# Patient Record
Sex: Female | Born: 1955 | Hispanic: No | Marital: Married | Smoking: Former smoker
Health system: Western US, Academic
[De-identification: ages and names within clinical notes are randomized; demographics above are authoritative.]

## PROBLEM LIST (undated history)

## (undated) DIAGNOSIS — M25541 Pain in joints of right hand: Secondary | ICD-10-CM

## (undated) DIAGNOSIS — M5430 Sciatica, unspecified side: Secondary | ICD-10-CM

## (undated) DIAGNOSIS — K648 Other hemorrhoids: Secondary | ICD-10-CM

## (undated) DIAGNOSIS — D369 Benign neoplasm, unspecified site: Secondary | ICD-10-CM

## (undated) DIAGNOSIS — I10 Essential (primary) hypertension: Secondary | ICD-10-CM

## (undated) DIAGNOSIS — H6982 Other specified disorders of Eustachian tube, left ear: Principal | ICD-10-CM

## (undated) DIAGNOSIS — F419 Anxiety disorder, unspecified: Secondary | ICD-10-CM

## (undated) DIAGNOSIS — R748 Abnormal levels of other serum enzymes: Secondary | ICD-10-CM

## (undated) DIAGNOSIS — D696 Thrombocytopenia, unspecified: Secondary | ICD-10-CM

## (undated) DIAGNOSIS — H1013 Acute atopic conjunctivitis, bilateral: Secondary | ICD-10-CM

## (undated) DIAGNOSIS — R7301 Impaired fasting glucose: Secondary | ICD-10-CM

## (undated) DIAGNOSIS — K760 Fatty (change of) liver, not elsewhere classified: Secondary | ICD-10-CM

## (undated) DIAGNOSIS — E785 Hyperlipidemia, unspecified: Secondary | ICD-10-CM

## (undated) DIAGNOSIS — Z79890 Hormone replacement therapy (postmenopausal): Secondary | ICD-10-CM

## (undated) DIAGNOSIS — J309 Allergic rhinitis, unspecified: Secondary | ICD-10-CM

## (undated) DIAGNOSIS — I4949 Other premature depolarization: Secondary | ICD-10-CM

## (undated) DIAGNOSIS — R7303 Prediabetes: Principal | ICD-10-CM

## (undated) DIAGNOSIS — N200 Calculus of kidney: Secondary | ICD-10-CM

## (undated) DIAGNOSIS — C20 Malignant neoplasm of rectum: Secondary | ICD-10-CM

## (undated) DIAGNOSIS — N920 Excessive and frequent menstruation with regular cycle: Secondary | ICD-10-CM

## (undated) DIAGNOSIS — G43909 Migraine, unspecified, not intractable, without status migrainosus: Secondary | ICD-10-CM

## (undated) HISTORY — DX: Anxiety disorder, unspecified: F41.9

## (undated) HISTORY — DX: Prediabetes: R73.03

## (undated) HISTORY — DX: Malignant neoplasm of rectum: C20

## (undated) HISTORY — DX: Other premature depolarization: I49.49

## (undated) HISTORY — DX: Abnormal levels of other serum enzymes: R74.8

## (undated) HISTORY — DX: Thrombocytopenia, unspecified: D69.6

## (undated) HISTORY — DX: Fatty (change of) liver, not elsewhere classified: K76.0

## (undated) HISTORY — DX: Hormone replacement therapy: Z79.890

## (undated) HISTORY — DX: Other hemorrhoids: K64.8

## (undated) HISTORY — DX: Excessive and frequent menstruation with regular cycle: N92.0

## (undated) HISTORY — DX: Hyperlipidemia, unspecified: E78.5

## (undated) HISTORY — DX: Essential (primary) hypertension: I10

## (undated) HISTORY — DX: Impaired fasting glucose: R73.01

## (undated) HISTORY — DX: Calculus of kidney: N20.0

## (undated) HISTORY — DX: Acute atopic conjunctivitis, bilateral: H10.13

## (undated) HISTORY — DX: Other specified disorders of eustachian tube, left ear: H69.82

## (undated) HISTORY — DX: Sciatica, unspecified side: M54.30

## (undated) HISTORY — DX: Migraine, unspecified, not intractable, without status migrainosus: G43.909

## (undated) HISTORY — DX: Allergic rhinitis, unspecified: J30.9

## (undated) HISTORY — PX: TONSILLECTOMY: SHX001830

## (undated) HISTORY — DX: Benign neoplasm, unspecified site: D36.9

## (undated) HISTORY — DX: Pain in joints of right hand: M25.541

---
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
## 2001-03-10 HISTORY — PX: PR LAP PROCEDURE, UNLISTED, BLADDER: 51999

## 2003-02-08 HISTORY — PX: PR REMOVAL OF LEG VEINS/ULCER: 37735

## 2004-03-10 DIAGNOSIS — M5430 Sciatica, unspecified side: Secondary | ICD-10-CM

## 2004-03-10 HISTORY — DX: Sciatica, unspecified side: M54.30

## 2006-02-27 DIAGNOSIS — K648 Other hemorrhoids: Secondary | ICD-10-CM | POA: Insufficient documentation

## 2006-02-27 DIAGNOSIS — K644 Residual hemorrhoidal skin tags: Secondary | ICD-10-CM | POA: Insufficient documentation

## 2006-02-27 HISTORY — DX: Other hemorrhoids: K64.8

## 2006-03-10 HISTORY — PX: BIOPSY, BREAST: SHX000098

## 2006-05-18 HISTORY — PX: PR COLSC FLX W/RMVL OF TUMOR POLYP LESION SNARE TQ: 45385

## 2006-06-11 DIAGNOSIS — D696 Thrombocytopenia, unspecified: Secondary | ICD-10-CM

## 2006-06-11 HISTORY — DX: Thrombocytopenia, unspecified: D69.6

## 2007-03-11 DIAGNOSIS — M25541 Pain in joints of right hand: Secondary | ICD-10-CM

## 2007-03-11 HISTORY — DX: Pain in joints of right hand: M25.541

## 2007-05-24 DIAGNOSIS — H1045 Other chronic allergic conjunctivitis: Secondary | ICD-10-CM | POA: Insufficient documentation

## 2009-10-08 HISTORY — PX: BIOPSY, BREAST: SHX000098

## 2010-01-25 ENCOUNTER — Other Ambulatory Visit: Payer: Self-pay

## 2010-01-25 MED ORDER — ATORVASTATIN 20 MG TABLET
1.0000 | ORAL_TABLET | Freq: Every day | ORAL | 1 refills | Status: DC
  Filled 2010-01-25: qty 90, 90d supply, fill #0

## 2010-04-23 ENCOUNTER — Other Ambulatory Visit: Payer: Self-pay

## 2010-04-23 ENCOUNTER — Other Ambulatory Visit: Payer: Self-pay | Admitting: Family Medicine

## 2010-04-23 NOTE — Telephone Encounter (Signed)
Patient is not seen in this clinic.  Can you please assist patient with this?  Thank You,   Nicholaus Steinke  [facility]  Refill Coordinator

## 2010-04-24 MED ORDER — ATORVASTATIN 20 MG TABLET
1.0000 | ORAL_TABLET | Freq: Every day | ORAL | 1 refills | Status: DC
  Filled 2010-04-24: qty 90, 90d supply, fill #0
  Filled 2010-07-26: qty 90, 90d supply, fill #1

## 2010-07-26 ENCOUNTER — Other Ambulatory Visit: Payer: Self-pay

## 2010-11-06 ENCOUNTER — Other Ambulatory Visit: Payer: Self-pay

## 2010-11-06 MED ORDER — ATORVASTATIN 20 MG TABLET
1.0000 | ORAL_TABLET | Freq: Every day | ORAL | 3 refills | Status: DC
  Filled 2010-11-06: qty 90, 90d supply, fill #0
  Filled 2011-02-13: qty 90, 90d supply, fill #1
  Filled 2011-05-13: qty 90, 90d supply, fill #2
  Filled 2011-08-13: qty 90, 90d supply, fill #3

## 2011-04-22 DIAGNOSIS — Z9889 Other specified postprocedural states: Secondary | ICD-10-CM | POA: Insufficient documentation

## 2011-04-22 HISTORY — PX: PR COLSC FLX W/RMVL OF TUMOR POLYP LESION SNARE TQ: 45385

## 2011-08-13 ENCOUNTER — Other Ambulatory Visit: Payer: Self-pay

## 2011-11-12 ENCOUNTER — Other Ambulatory Visit: Payer: Self-pay

## 2011-11-12 MED ORDER — ATORVASTATIN 20 MG TABLET
1.0000 | ORAL_TABLET | Freq: Every day | ORAL | 2 refills | Status: DC
  Filled 2011-11-12: qty 90, 90d supply, fill #0
  Filled 2012-02-02: qty 90, 90d supply, fill #1

## 2011-11-21 ENCOUNTER — Other Ambulatory Visit: Payer: Self-pay

## 2011-11-21 MED ORDER — ESTRADIOL 0.05 MG-NORETHINDRONE 0.25 MG/24 HR SEMIWKLY TRANSDERM PATCH
1.0000 | MEDICATED_PATCH | TRANSDERMAL | 3 refills | Status: DC
Start: 2011-11-21 — End: 2013-01-04
  Filled 2011-11-21 – 2011-12-29 (×2): qty 24, 84d supply, fill #0
  Filled 2012-04-07: qty 24, 84d supply, fill #1
  Filled 2012-07-09: qty 24, 84d supply, fill #2
  Filled 2012-10-01: qty 24, 84d supply, fill #3

## 2011-12-29 ENCOUNTER — Other Ambulatory Visit: Payer: Self-pay

## 2011-12-31 ENCOUNTER — Other Ambulatory Visit: Payer: Self-pay

## 2012-02-02 ENCOUNTER — Other Ambulatory Visit: Payer: Self-pay

## 2012-03-25 ENCOUNTER — Ambulatory Visit

## 2012-04-07 ENCOUNTER — Other Ambulatory Visit: Payer: Self-pay

## 2012-04-08 ENCOUNTER — Other Ambulatory Visit: Payer: Self-pay

## 2012-04-29 ENCOUNTER — Ambulatory Visit

## 2012-04-29 ENCOUNTER — Ambulatory Visit: Admitting: Family Medicine

## 2012-04-29 ENCOUNTER — Encounter: Payer: Self-pay | Admitting: Family Medicine

## 2012-04-29 ENCOUNTER — Other Ambulatory Visit: Payer: Self-pay

## 2012-04-29 VITALS — BP 120/78 | HR 96 | Temp 98.4°F | Resp 16 | Ht 65.0 in | Wt 139.7 lb

## 2012-04-29 DIAGNOSIS — F419 Anxiety disorder, unspecified: Secondary | ICD-10-CM | POA: Insufficient documentation

## 2012-04-29 DIAGNOSIS — Z79890 Hormone replacement therapy (postmenopausal): Secondary | ICD-10-CM | POA: Insufficient documentation

## 2012-04-29 DIAGNOSIS — G43909 Migraine, unspecified, not intractable, without status migrainosus: Secondary | ICD-10-CM | POA: Insufficient documentation

## 2012-04-29 DIAGNOSIS — J309 Allergic rhinitis, unspecified: Secondary | ICD-10-CM | POA: Insufficient documentation

## 2012-04-29 DIAGNOSIS — E785 Hyperlipidemia, unspecified: Secondary | ICD-10-CM | POA: Insufficient documentation

## 2012-04-29 HISTORY — DX: Allergic rhinitis, unspecified: J30.9

## 2012-04-29 HISTORY — DX: Migraine, unspecified, not intractable, without status migrainosus: G43.909

## 2012-04-29 HISTORY — DX: Hormone replacement therapy: Z79.890

## 2012-04-29 HISTORY — DX: Anxiety disorder, unspecified: F41.9

## 2012-04-29 HISTORY — DX: Hyperlipidemia, unspecified: E78.5

## 2012-04-29 MED ORDER — BUTALBITAL-ASPIRIN-CAFFEINE 50 MG-325 MG-40 MG CAPSULE
1.0000 | ORAL_CAPSULE | Freq: Four times a day (QID) | ORAL | 1 refills | Status: DC | PRN
  Filled 2012-04-30: qty 30, 10d supply, fill #0
  Filled 2012-07-01: qty 30, 10d supply, fill #1

## 2012-04-29 MED ORDER — ATORVASTATIN 20 MG TABLET
1.0000 | ORAL_TABLET | Freq: Every day | ORAL | 3 refills | Status: DC
  Filled 2012-04-29: qty 90, 90d supply, fill #0
  Filled 2012-08-17: qty 90, 90d supply, fill #1

## 2012-04-29 NOTE — Nursing Note (Signed)
>>   Marisa Jenkins     Thu Apr 29, 2012 10:25 AM  Rapid Strep Test done. Scheryl Darter, MAII    >> Marisa Jenkins     Thu Apr 29, 2012 10:10 AM  vital signs taken, allergies verified,screened for pain,and medication history taken.    Scheryl Darter, [facility]

## 2012-04-29 NOTE — Patient Instructions (Addendum)
HOW DO I CONTACT MY PHYSICIAN?  If you have a life threatening emergency, dial 911.  Any other time, including weekend/after hours, just call [phone]. If the office is closed, the message will direct you to call the after hours operator who will page the on-call doctor. They will talk to you to determine if the problem can be handled over the phone, or if you need to go to urgent care or the emergency room.     HOW DO I E-MAIL MY DOCTOR?  To contact your physician by e-mail go on-line at my chart.     I have ordered your fasting blood tests.  You can drop in to the downstairs lab any time between 7:30 am and 4pm, except closed for lunch 12:30-1PM. (These are the new lab hours and appointments are no longer needed).  Ja Ohman, MD

## 2012-04-30 ENCOUNTER — Encounter: Payer: Self-pay | Admitting: Family Medicine

## 2012-04-30 ENCOUNTER — Telehealth: Payer: Self-pay | Admitting: Family Medicine

## 2012-04-30 ENCOUNTER — Other Ambulatory Visit: Payer: Self-pay

## 2012-04-30 DIAGNOSIS — M25541 Pain in joints of right hand: Secondary | ICD-10-CM | POA: Insufficient documentation

## 2012-04-30 DIAGNOSIS — R7301 Impaired fasting glucose: Secondary | ICD-10-CM | POA: Insufficient documentation

## 2012-04-30 NOTE — Progress Notes (Signed)
04/30/2012   EPIC FAMILY MEDICINE VISIT: MRN# [id number]  Name: Marisa Jenkins  Date of birth: April 26, 1955  Chief Complaint   Patient presents with    Establish Care    Well Woman (GYN)    Ear Problem     plugged     Sore Throat       2:01 AM     SUBJECTIVE:  [name] is a 57yr old female who is here to establish care, and for routine physical  History of benign breast lump, does self breast exams no recent abnormality    High cholesterol, not exercising as much, does take Lipitor daily requests labs    Migraine headaches, hormonal he related  Takes Fiorinal about once or twice a month, usually works    Allergies, no allergy testing in past, takes Zyrtec daily. Requests testing, due to ongoing daily symptoms with congestion sneezing, headaches    Chronic social anxiety disorder - request eyes of Pam refill. Takes in frequently for panic attacks       Patient Active Problem List   Diagnosis    Hyperlipidemia    Postmenopausal HRT (hormone replacement therapy)    Migraine headache    Allergic rhinitis    Anxiety disorder    Impaired fasting glucose    Pain in joint of right hand    Internal hemorrhoids       REVIEW of SYSTEMS :  Constitutional: negative.  Eyes: negative.  Ears, Nose, Mouth, Throat: nasal congestion, sneezing,postnasal drainage.  CV: negative.  Resp: cough, tickle cough,rarely wheezing.  GI: negative.  GU: negative.  Musculoskeletal: negative.  Integumentary: negative, no new lesions noted.  Neuro: headaches.  Psych: Mood pt's report, anxiety.  Allergy/Immun: itchy eyes, itchy nose  GYN: negative, denies any vaginal dryness has had hot flashes in the past, none recently.     OBJECTIVE:  Vital signs:    BP 120/78  Pulse 96  Temp(Src) 36.9 C (98.4 F) (Oral)  Resp 16  Ht 1.651 m (5\' 5" )  Wt 63.368 kg (139 lb 11.2 oz)  BMI 23.25 kg/m2  LMP 10/28/2010   General Appearance: healthy, alert, no distress, pleasant affect, cooperative, anxious normal weight.  Eyes:  conjunctivae and corneas  clear. PERRL, EOM's intact. sclerae normal.  Ears:  normal TMs and canal and external inspection of ears show no abnormality.  Nose:  normal.  Mouth: normal.  Neck:  Neck supple. No adenopathy, thyroid symmetric, normal size.  Heart:  normal rate and regular rhythm, no murmurs, clicks, or gallops.  Lungs: clear to auscultation.  Breast:  inspection negative. No nipple discharge, bleeding or masses.  Abdomen: BS normal.  Abdomen soft, non-tender.  No masses or organomegaly.  Extremities:  no cyanosis, clubbing, or edema.  Skin:  Skin color, texture, turgor normal. No rashes or lesions.  Pelvic:  negative.  Genital Exam: Penis normal. No urethral discharge. Scrotum normal to palpation. No hernia.  Rectal: negative.  Neuro: Gait normal. Reflexes normal and symmetric. Sensation and strength grossly normal.  Mental Status:  Generalized anxiety symptoms       ASSESSMENT:  (V70.0) Routine general medical examination at a health care facility  (primary encounter diagnosis)  Comment:   Plan: BLOOD COUNT, COMPREHENSIVE METABOLIC PANEL,         LIPID PANEL WITH DLDL REFLEX            (272.4) Hyperlipidemia  Comment:   Plan: COMPREHENSIVE METABOLIC PANEL, LIPID PANEL WITH  DLDL REFLEX, Atorvastatin (LIPITOR) 20 mg         Tablet            (V07.4) Postmenopausal HRT (hormone replacement therapy)  Comment:   Plan: reviewed treatment with combo patch, discuss potential increased risk for stroke, heart attack, and headaches she refused to stop taking.    (465.9) URI (upper respiratory infection)  Comment:   Plan: POC RAPID STREP A            (346.90) Migraine headache  Comment:   Plan: Butalbital-Aspirin-Caffeine (FIORINAL)         50-325-40 mg per capsule            (477.9) Allergic rhinitis  Comment:   Plan: Cetirizine (ZYRTEC) 10 mg Tablet, ALLERGY         REFERRAL            (300.00) Anxiety disorder  Comment:   Plan: Diazepam (VALIUM) 5 mg Tablet            Advised to call back if symptoms fail to improve as expected.      Total visit time: 45 minutes, of which  20 % was spent face-to-face counseling.   I did review patient's past medical and family/social history, no changes noted.  Barriers to Learning assessed: none. Patient verbalizes understanding of teaching and instructions.  Freddie Breech, MD  Family Practice, [facility]  [facility]     This document has been created using Dragon Naturally Speaking and has been checked for content. Some voice recognition errors may have been missed inadvertantly.

## 2012-04-30 NOTE — Telephone Encounter (Signed)
The prescription for Fiorinal 50-325-40 mg capsule has been faxed to our [facility]. Scheryl Darter, [facility]

## 2012-05-03 ENCOUNTER — Other Ambulatory Visit: Payer: Self-pay

## 2012-05-10 ENCOUNTER — Ambulatory Visit

## 2012-05-12 ENCOUNTER — Ambulatory Visit

## 2012-05-19 ENCOUNTER — Other Ambulatory Visit: Payer: Self-pay

## 2012-05-19 ENCOUNTER — Ambulatory Visit: Admitting: Rheumatology

## 2012-05-19 VITALS — BP 120/83 | HR 92 | Temp 97.8°F | Resp 16 | Ht 64.25 in | Wt 139.1 lb

## 2012-05-19 DIAGNOSIS — J309 Allergic rhinitis, unspecified: Secondary | ICD-10-CM

## 2012-05-19 MED ORDER — FLUTICASONE PROPIONATE 50 MCG/ACTUATION NASAL SPRAY,SUSPENSION
1.0000 | Freq: Every day | NASAL | 3 refills | Status: AC
Start: 2012-05-19 — End: 2013-05-19
  Filled 2012-05-19: qty 16, 30d supply, fill #0
  Filled 2013-03-16: qty 16, 30d supply, fill #1

## 2012-05-19 NOTE — Nursing Note (Signed)
>>   [name]     Wed May 19, 2012  3:11 PM  Vital signs taken, allergies verified, screened for pain, medication  history taken.  [name], LVN

## 2012-05-19 NOTE — Progress Notes (Signed)
Dear Dr. 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  The patient states that she began to have problems with runny and itchy nose with sneezing in her 30s having grown up in [place].  She has not had any pulmonary or skin problems.  Her symptoms are near perennial but worse in the spring and early summer months.  She was initially treated with Rhinocort and H1 blocker but currently is only taking Zyrtec without full control.  She desires consideration for immunotherapy.  Irritants as well as exposure to cats make her symptoms worse.  She has had hives with penicillin.    Past medical history and review of systems questionnaire is only notable for sinus headaches and hyperlipidemia treated with Lipitor.  Family history is positive for 2 brothers and a sister with hayfever but none with asthma.  Social history: The patient smoked between ages [age]; she lives in a house in [place] with central air conditioning and carpeting but no pets.    Objective data: BP 120/83.  Pulse 92.  Afebrile.  Weight 139.  HEENT: Conjunctivae, tympanic membranes and pharynx were clear.  Nasal mucosa showed mild swelling without ulceration.  Neck was supple without masses.  Chest was clear to auscultation.  C.-V.: No murmurs or rubs.  Skin: No acute rashes.    Assessment/plan: Allergic rhinitis.  The patient will undergo skin testing after she has been off of Zyrtec one week.  She was prescribed and instructed on the use of a fluticasone nasal spray.  She will gargle after its use.    I appreciate being able to assist in her care.

## 2012-05-27 ENCOUNTER — Ambulatory Visit: Admitting: Rheumatology

## 2012-05-27 ENCOUNTER — Other Ambulatory Visit: Payer: Self-pay | Admitting: Rheumatology

## 2012-05-27 VITALS — BP 138/79 | HR 85 | Temp 97.0°F | Resp 16 | Wt 139.0 lb

## 2012-05-27 DIAGNOSIS — J301 Allergic rhinitis due to pollen: Secondary | ICD-10-CM

## 2012-05-27 NOTE — Nursing Note (Signed)
>>   CRISTINA ROTHWELL, RN     Thu May 27, 2012  2:22 PM  #35 prick skin test applied, patient tolerated test well. Read at 20 minutes. Results to Dr. Eusebio Me.   Bernardo Heater, RN    >> Ann Maki, MA     Thu May 27, 2012  1:07 PM  vitals taken, allergies reviewed, pain level recorded.  Diane Joya Gaskins MAII

## 2012-05-27 NOTE — Progress Notes (Signed)
Patient desired to start Immunotherapy. Consent signed. T&C signed. Educated on Immunotherapy protocol.   Bernardo Heater, RN

## 2012-05-27 NOTE — Progress Notes (Signed)
The patient is seen in followup for her allergic rhinitis for skin testing.    35 antigen prick skin test was positive to grasses, [place], early/late trees, weeds, cats, dog, feathers.    Assessment/plan: Allergic rhinitis.  The patient would like to start immunotherapy be which will be formulated.  She will use cetirizine and fluticasone nasal spray for breakthrough symptoms.

## 2012-05-28 NOTE — Progress Notes (Signed)
Auth requested and will print to suite a printed if approved.    Marisa Jenkins  M.O.S.C. III

## 2012-06-08 ENCOUNTER — Ambulatory Visit

## 2012-06-08 NOTE — Nursing Note (Signed)
>>   Lavella Lemons, RN     Tue Jun 08, 2012 10:11 AM  35 doses Aq Tree/Grass/Weed mixed.  Lavella Lemons, RN

## 2012-06-15 ENCOUNTER — Ambulatory Visit

## 2012-06-15 NOTE — Nursing Note (Signed)
>>   Lavella Lemons, RN     Tue Jun 15, 2012  5:10 PM    After 30 minutes observation, Patient's injection sites were checked for any local reaction. Patient then left the clinic feeling fine.  Local reaction was 0 (wheal/erythema).  Lavella Lemons, RN    >> Lavella Lemons, RN     Tue Jun 15, 2012  4:46 PM  Patient here for Immunotherapy.  Patient and antigen were identified with 2 identifiers, including patient's confirmation of correct antigen bottle(s).  Patient's condition was then assessed, which included any immediate or delayed reaction to previous dose, current health status, Peak flow reading if indicated, use of any new meds such as antibiotic or Beta Blocker, if antihistamine had been taken within the previous 24 hours, and length of time since last allergy injection(s).    The allergy injection(s) were then administered per the build up schedule.  Patients dose was first injection.(reduced, repeated, advanced).   Patient received 0.05 of 1:1000 Aq Tree/Grass/Weed   .( dose, concentration, name of antigen).  Lavella Lemons, RN

## 2012-06-16 ENCOUNTER — Telehealth: Payer: Self-pay | Admitting: Family Medicine

## 2012-06-16 NOTE — Telephone Encounter (Signed)
Patient called stating that last time she saw Dr. Carlynn Purl it was recommended she make a nutrition appointment. Appointment has been made requesting to get referral to be placed.    Glendale Wherry  MA II

## 2012-06-17 ENCOUNTER — Ambulatory Visit

## 2012-06-17 NOTE — Telephone Encounter (Signed)
Referral sent thank you. Please let patient know. Freddie Breech, MD

## 2012-06-17 NOTE — Nursing Note (Signed)
>>   Lavella Lemons, RN     Thu Jun 17, 2012  5:07 PM  Patient here for Immunotherapy.  Patient and antigen were identified with 2 identifiers, including patient's confirmation of correct antigen bottle(s).  Patient's condition was then assessed, which included any immediate or delayed reaction to previous dose, current health status, Peak flow reading if indicated, use of any new meds such as antibiotic or Beta Blocker, if antihistamine had been taken within the previous 24 hours, and length of time since last allergy injection(s).    The allergy injection(s) were then administered per the build up schedule.  Patients dose was advanced.(reduced, repeated, advanced).   Patient received 0.15 of 1:1000 Aq Tree/Grass/Weed   .( dose, concentration, name of antigen).  Lavella Lemons, RN      After 30 minutes observation, Patient's injection sites were checked for any local reaction. Patient then left the clinic feeling fine.  Local reaction was 0 (wheal/erythema).  Lavella Lemons, RN

## 2012-06-18 NOTE — Telephone Encounter (Signed)
Patient has been informed, appointment made with Dietician at [facility].    Clotiel Troop  MA II

## 2012-06-22 ENCOUNTER — Ambulatory Visit: Admitting: FAMILY PRACTICE

## 2012-06-22 ENCOUNTER — Ambulatory Visit

## 2012-06-22 ENCOUNTER — Encounter: Payer: Self-pay | Admitting: FAMILY PRACTICE

## 2012-06-22 VITALS — BP 115/72 | HR 73 | Temp 98.1°F | Resp 18

## 2012-06-22 NOTE — Nursing Note (Signed)
>>   Marisa Jenkins     Tue Jun 22, 2012  3:32 PM  Vital signs taken, allergies verified, screened for pain, med hx taken,  screened for chicken pox, and verified immunization status.  Ellouise Newer, MA

## 2012-06-22 NOTE — Nursing Note (Signed)
>>   Lavella Lemons, RN     Tue Jun 22, 2012  3:28 PM    After 30 minutes observation, Patient's injection sites were checked for any local reaction. Patient then left the clinic feeling fine.  Local reaction was 0 (wheal/erythema).  Lavella Lemons, RN    >> Lavella Lemons, RN     Tue Jun 22, 2012  3:23 PM  Patient here for Immunotherapy.  Patient and antigen were identified with 2 identifiers, including patient's confirmation of correct antigen bottle(s).  Patient's condition was then assessed, which included any immediate or delayed reaction to previous dose, current health status, Peak flow reading if indicated, use of any new meds such as antibiotic or Beta Blocker, if antihistamine had been taken within the previous 24 hours, and length of time since last allergy injection(s).    The allergy injection(s) were then administered per the build up schedule.  Patients dose was advanced.(reduced, repeated, advanced).   Patient received 0.25 of 1:1000 Aq Tree/Grass/Weed   .( dose, concentration, name of antigen).  Lavella Lemons, RN

## 2012-06-23 NOTE — Progress Notes (Signed)
S: Marisa Jenkins is a 57yr old female is here for evaluation of rash on her upper buttock area. The patient states that she felt itching on her L upper buttock area yesterday. The patient states that the rash is itchy. The patient states that the rash has not spread. The patient cannot think of any specific trigger for the rash. The patient states that she was eating dinner outside the night before. The patient states that she has not tried any medication for rash.    O: BP 115/72  Pulse 73  Temp(Src) 36.7 C (98.1 F) (Tympanic)  Resp 18  LMP 10/28/2010  GEN: healthy appearing, NAD  Skin: three small erythematous hive-like lesions on the L upper buttock    A/P:  (782.1) Rash and nonspecific skin eruption  (primary encounter diagnosis)  Comment: possible insect bite  Plan: patient to apply hydrocortisone cream to the area  - patient can also take benadryl or apply benadryl cream to the area    Cheryll Dessert, MD  Family Medicine Physician  [phone]  [facility]

## 2012-06-24 ENCOUNTER — Ambulatory Visit

## 2012-06-24 NOTE — Nursing Note (Signed)
>>   Lavella Lemons, RN     Thu Jun 24, 2012  4:59 PM    After 30 minutes observation, Patient's injection sites were checked for any local reaction. Patient then left the clinic feeling fine.  Local reaction was 0 (wheal/erythema).  Lavella Lemons, RN    >> Lavella Lemons, RN     Thu Jun 24, 2012  4:56 PM  Patient here for Immunotherapy.  Patient and antigen were identified with 2 identifiers, including patient's confirmation of correct antigen bottle(s).  Patient's condition was then assessed, which included any immediate or delayed reaction to previous dose, current health status, Peak flow reading if indicated, use of any new meds such as antibiotic or Beta Blocker, if antihistamine had been taken within the previous 24 hours, and length of time since last allergy injection(s).    The allergy injection(s) were then administered per the build up schedule.  Patients dose was advanced.(reduced, repeated, advanced).   Patient received 0.50 of 1:1000 .( dose, concentration, name of antigen).  Lavella Lemons, RN

## 2012-06-29 ENCOUNTER — Ambulatory Visit

## 2012-06-29 NOTE — Nursing Note (Signed)
>>   Lavella Lemons, RN     Tue Jun 29, 2012  5:15 PM    After 30 minutes observation, Patient's injection sites were checked for any local reaction. Patient then left the clinic feeling fine.  Local reaction was 3/3 (wheal/erythema).  Lavella Lemons, RN    >> Lavella Lemons, RN     Tue Jun 29, 2012  4:43 PM  Patient here for Immunotherapy.  Patient and antigen were identified with 2 identifiers, including patient's confirmation of correct antigen bottle(s).  Patient's condition was then assessed, which included any immediate or delayed reaction to previous dose, current health status, Peak flow reading if indicated, use of any new meds such as antibiotic or Beta Blocker, if antihistamine had been taken within the previous 24 hours, and length of time since last allergy injection(s).    The allergy injection(s) were then administered per the build up schedule.  Patients dose was advanced.(reduced, repeated, advanced).   Patient received 0.05 of 1:100 Aq Tree/Grass/Weed   .( dose, concentration, name of antigen).  Lavella Lemons, RN

## 2012-07-01 ENCOUNTER — Other Ambulatory Visit: Payer: Self-pay

## 2012-07-01 ENCOUNTER — Ambulatory Visit

## 2012-07-01 NOTE — Nursing Note (Signed)
>>   Lavella Lemons, RN     Thu Jul 01, 2012  5:37 PM  Patient here for Immunotherapy.  Patient and antigen were identified with 2 identifiers, including patient's confirmation of correct antigen bottle(s).  Patient's condition was then assessed, which included any immediate or delayed reaction to previous dose, current health status, Peak flow reading if indicated, use of any new meds such as antibiotic or Beta Blocker, if antihistamine had been taken within the previous 24 hours, and length of time since last allergy injection(s).    The allergy injection(s) were then administered per the build up schedule.  Patients dose was advanced.(reduced, repeated, advanced).   Patient received 0.10 of 1:100 Aq Tree/Grass/Weed   .( dose, concentration, name of antigen).  Lavella Lemons, RN      After 30 minutes observation, Patient's injection sites were checked for any local reaction. Patient then left the clinic feeling fine.  Local reaction was 3/3 (wheal/erythema).  Lavella Lemons, RN

## 2012-07-02 ENCOUNTER — Ambulatory Visit

## 2012-07-06 ENCOUNTER — Ambulatory Visit

## 2012-07-06 NOTE — Nursing Note (Signed)
>>   Lavella Lemons, RN     Tue Jul 06, 2012  4:52 PM  Patient here for Immunotherapy.  Patient and antigen were identified with 2 identifiers, including patient's confirmation of correct antigen bottle(s).  Patient's condition was then assessed, which included any immediate or delayed reaction to previous dose, current health status, Peak flow reading if indicated, use of any new meds such as antibiotic or Beta Blocker, if antihistamine had been taken within the previous 24 hours, and length of time since last allergy injection(s).    The allergy injection(s) were then administered per the build up schedule.  Patients dose was advanced.(reduced, repeated, advanced).   Patient received 0.20 of 1:100 Aq Tree/Grass/Weed   .( dose, concentration, name of antigen).  Lavella Lemons, RN      After 30 minutes observation, Patient's injection sites were checked for any local reaction. Patient then left the clinic feeling fine.  Local reaction was 8/15 (wheal/erythema).  Lavella Lemons, RN

## 2012-07-08 ENCOUNTER — Ambulatory Visit

## 2012-07-08 NOTE — Nursing Note (Signed)
>>   Lavella Lemons, RN     Thu Jul 08, 2012  5:35 PM  Patient here for Immunotherapy.  Patient and antigen were identified with 2 identifiers, including patient's confirmation of correct antigen bottle(s).  Patient's condition was then assessed, which included any immediate or delayed reaction to previous dose, current health status, Peak flow reading if indicated, use of any new meds such as antibiotic or Beta Blocker, if antihistamine had been taken within the previous 24 hours, and length of time since last allergy injection(s).    The allergy injection(s) were then administered per the build up schedule.  Patients dose was advanced.(reduced, repeated, advanced).   Patient received 0.30 of 1:100 Aq Tree/Grass/Weed   .( dose, concentration, name of antigen).  Lavella Lemons, RN      After 30 minutes observation, Patient's injection sites were checked for any local reaction. Patient then left the clinic feeling fine.  Local reaction was 10/11 (wheal/erythema).  Lavella Lemons, RN

## 2012-07-12 ENCOUNTER — Other Ambulatory Visit: Payer: Self-pay

## 2012-07-13 ENCOUNTER — Ambulatory Visit

## 2012-07-13 NOTE — Nursing Note (Signed)
>>   Lavella Lemons, RN     Tue Jul 13, 2012  4:59 PM    After 30 minutes observation, Patient's injection sites were checked for any local reaction. Patient then left the clinic feeling fine.  Local reaction was 4/8 (wheal/erythema).  Lavella Lemons, RN    >> Lavella Lemons, RN     Tue Jul 13, 2012  4:36 PM  Patient here for Immunotherapy.  Patient and antigen were identified with 2 identifiers, including patient's confirmation of correct antigen bottle(s).  Patient's condition was then assessed, which included any immediate or delayed reaction to previous dose, current health status, Peak flow reading if indicated, use of any new meds such as antibiotic or Beta Blocker, if antihistamine had been taken within the previous 24 hours, and length of time since last allergy injection(s).    The allergy injection(s) were then administered per the build up schedule.  Patients dose was advanced.(reduced, repeated, advanced).   Patient received 0.40 of 1:100 Aq Tree/Grass/Weed   .( dose, concentration, name of antigen).  Lavella Lemons, RN

## 2012-07-15 ENCOUNTER — Ambulatory Visit

## 2012-07-15 NOTE — Nursing Note (Signed)
>>   Lavella Lemons, RN     Thu Jul 15, 2012  5:11 PM    After 30 minutes observation, Patient's injection sites were checked for any local reaction. Patient then left the clinic feeling fine.  Local reaction was 2/2 (wheal/erythema).    Lavella Lemons, RN    >> Lavella Lemons, RN     Thu Jul 15, 2012  4:48 PM  Patient here for Immunotherapy.  Patient and antigen were identified with 2 identifiers, including patient's confirmation of correct antigen bottle(s).  Patient's condition was then assessed, which included any immediate or delayed reaction to previous dose, current health status, Peak flow reading if indicated, use of any new meds such as antibiotic or Beta Blocker, if antihistamine had been taken within the previous 24 hours, and length of time since last allergy injection(s).    The allergy injection(s) were then administered per the build up schedule.  Patients dose was increased.(reduced, repeated, advanced).   Patient received 0.50 of 1:100 Aq Tree/Grass/Weed   .( dose, concentration, name of antigen).  Lavella Lemons, RN

## 2012-07-20 ENCOUNTER — Ambulatory Visit

## 2012-07-20 NOTE — Nursing Note (Signed)
>>   Lavella Lemons, RN     Tue Jul 20, 2012  4:53 PM  Patient here for Immunotherapy.  Patient and antigen were identified with 2 identifiers, including patient's confirmation of correct antigen bottle(s).  Patient's condition was then assessed, which included any immediate or delayed reaction to previous dose, current health status, Peak flow reading if indicated, use of any new meds such as antibiotic or Beta Blocker, if antihistamine had been taken within the previous 24 hours, and length of time since last allergy injection(s).    The allergy injection(s) were then administered per the build up schedule.  Patients dose was advanced.(reduced, repeated, advanced).   Patient received 0.05 of 1:10 Aq Tree/Grass/Weed   .( dose, concentration, name of antigen).  Lavella Lemons, RN      After 30 minutes observation, Patient's injection sites were checked for any local reaction. Patient then left the clinic feeling fine.  Local reaction was 5/6 (wheal/erythema).  Lavella Lemons, RN

## 2012-07-22 ENCOUNTER — Ambulatory Visit

## 2012-07-22 NOTE — Nursing Note (Signed)
>>   Lavella Lemons, RN     Thu Jul 22, 2012  5:12 PM    After 30 minutes observation, Patient's injection sites were checked for any local reaction. Patient then left the clinic feeling fine.  Local reaction was 4/9 (wheal/erythema).  Lavella Lemons, RN    >> Lavella Lemons, RN     Thu Jul 22, 2012  4:49 PM  Patient here for Immunotherapy.  Patient and antigen were identified with 2 identifiers, including patient's confirmation of correct antigen bottle(s).  Patient's condition was then assessed, which included any immediate or delayed reaction to previous dose, current health status, Peak flow reading if indicated, use of any new meds such as antibiotic or Beta Blocker, if antihistamine had been taken within the previous 24 hours, and length of time since last allergy injection(s).    The allergy injection(s) were then administered per the build up schedule.  Patients dose was advanced.(reduced, repeated, advanced).   Patient received 0.10 of 1:10 Aq Tree/Grass/Weed   .( dose, concentration, name of antigen).  Lavella Lemons, RN

## 2012-07-27 ENCOUNTER — Ambulatory Visit

## 2012-07-29 ENCOUNTER — Ambulatory Visit

## 2012-07-29 NOTE — Nursing Note (Signed)
>>   Lavella Lemons, RN     Thu Jul 29, 2012  5:41 PM    After 30 minutes observation, Patient's injection sites were checked for any local reaction. Patient then left the clinic feeling fine.  Local reaction was 6/6 (wheal/erythema).  Lavella Lemons, RN    >> Lavella Lemons, RN     Thu Jul 29, 2012  4:51 PM  Patient here for Immunotherapy.  Patient and antigen were identified with 2 identifiers, including patient's confirmation of correct antigen bottle(s).  Patient's condition was then assessed, which included any immediate or delayed reaction to previous dose, current health status, Peak flow reading if indicated, use of any new meds such as antibiotic or Beta Blocker, if antihistamine had been taken within the previous 24 hours, and length of time since last allergy injection(s).    The allergy injection(s) were then administered per the build up schedule.  Patients dose was increased.(reduced, repeated, advanced).   Patient received 0.20 of 1:10 Aq Tree/Grass/Weed   .( dose, concentration, name of antigen).  Lavella Lemons, RN

## 2012-08-03 ENCOUNTER — Ambulatory Visit

## 2012-08-03 NOTE — Nursing Note (Signed)
>>   Lavella Lemons, RN     Tue Aug 03, 2012  4:57 PM  Patient here for Immunotherapy.  Patient and antigen were identified with 2 identifiers, including patient's confirmation of correct antigen bottle(s).  Patient's condition was then assessed, which included any immediate or delayed reaction to previous dose, current health status, Peak flow reading if indicated, use of any new meds such as antibiotic or Beta Blocker, if antihistamine had been taken within the previous 24 hours, and length of time since last allergy injection(s).    The allergy injection(s) were then administered per the build up schedule.  Patients dose was advanced.(reduced, repeated, advanced).   Patient received 0.30 of 1:10 Aq Tree/Grass/Weed   .( dose, concentration, name of antigen).  Lavella Lemons, RN      After 30 minutes observation, Patient's injection sites were checked for any local reaction. Patient then left the clinic feeling fine.  Local reaction was 4/4 (wheal/erythema).  Lavella Lemons, RN

## 2012-08-05 ENCOUNTER — Ambulatory Visit

## 2012-08-05 NOTE — Nursing Note (Signed)
>>   Lavella Lemons, RN     Thu Aug 05, 2012  4:59 PM  Patient here for Immunotherapy.  Patient and antigen were identified with 2 identifiers, including patient's confirmation of correct antigen bottle(s).  Patient's condition was then assessed, which included any immediate or delayed reaction to previous dose, current health status, Peak flow reading if indicated, use of any new meds such as antibiotic or Beta Blocker, if antihistamine had been taken within the previous 24 hours, and length of time since last allergy injection(s).    The allergy injection(s) were then administered per the build up schedule.  Patients dose was advanced.(reduced, repeated, advanced).   Patient received 0.40 of 1:10 Aq Tree/Grass/Weed   .( dose, concentration, name of antigen).  Lavella Lemons, RN      After 30 minutes observation, Patient's injection sites were checked for any local reaction. Patient then left the clinic feeling fine.  Local reaction was 6/6 (wheal/erythema).  Lavella Lemons, RN

## 2012-08-09 ENCOUNTER — Ambulatory Visit

## 2012-08-09 ENCOUNTER — Ambulatory Visit: Payer: Self-pay

## 2012-08-09 NOTE — Progress Notes (Signed)
NUTRITION ASSESSMENT     Reason For Assessment:   Referring Diagnosis: HLP  Referring Provider: Dr. Carlynn Purl    Assessment:  Marisa Jenkins is a 57yr old female here for initial nutrition assessment.    Pertinent Medical History:    HLP. Impaired fasting glucose, anxiety    Patient/Other reports:   Patient arrived with her husband for a joint nutrition counseling session. She also had weight loss success a few years ago on the "17 Day Diet" which is low-carb, moderate lean protein. But she has regained all the weight. She does not go to the gym but has a treadmill at home.     Social History:   Living/housing situation: lives with husband, does most of the cooking  Occupation: employed    Network engineer related history:    Previously prescribed diets: none  Previous nutrition education/counseling: none  Self selected diet/s followed: 17-Day low carb diet several years ago  Dieting attempts: currently and ongoing for weight loss    Diet Recall: 3 meals per day without skipping  Has continued with few carbs for dinner, mostly lean meats and lots of cooked veg and salads.  Enjoys eating out a few times per month    Food Allergies: -    Physical Activity:   Physical activity history: treadmill at home for 45 minutes + few minutes of free weights but inconsistent during the week    Pertinent Labs:   Lab Results   Lab Name Value Date/Time    CHOL 216* 05/12/2012  8:04 AM    LDLC 132* 05/12/2012  8:04 AM    HDL 58 05/12/2012  8:04 AM    TRIG 132 05/12/2012  8:04 AM     Lab Results   Lab Name Value Date/Time    NA 138 05/12/2012  8:04 AM    K 4.4 05/12/2012  8:04 AM    CL 104 05/12/2012  8:04 AM    CO2 26 05/12/2012  8:04 AM    BUN 22 05/12/2012  8:04 AM    CR 0.56 05/12/2012  8:04 AM    GLU 104* 05/12/2012  8:04 AM     Pertinent Medications: lipitor    Nutrition Focused Physical Findings:  Overall appearance: Body habitus: well groomed, well nourished female with slight abdominal adiposity but overall healthy appearance    LMP 10/28/2010  Vitals   (Last Recorded Value for each vital) 05/27/2012   WEIGHT 139 lb   WEIGHT 63.05 kg   Ht 64 inches; BMI 23  Reference Weight: IBW = 120 pounds; 5% loss = 132 pounds      Estimated Nutrition Needs:   Mifflin x 1.3 = 1765 kcal   1275 - 1550 kcal per day for weight loss  65 g protein daily (1g/kg)  <12 g saturated fat daily (7% total kcal needs)  <200 mg cholesterol daily    Nutrition Diagnosis:  Food-and nutrition- related knowledge deficit related to no previous nutrition education on HLP as evidenced by patient inquiries.  NB-1.1    Nutrition Intervention:    1) Nutrition Education:   Provided education on dietary modifications for hypercholesterolemia. Reviewed lipid labs with patient. Discussed differences between saturated vs. unsaturated fat and their accompanying food sources. Discussed low-fat cooking preparations. Reviewed diet recall and provided individualized goals.   Brief education about lifestyle modifications for prediabetes. Reviewed elevated glucose lab. Primary focus was on increased exercise and slight weight modification of 5% loss.   Discussion about current barriers  to exercise, which patient feels is mainly lack of time after work.     Education needs identified:yes  Handouts provided: Heart Healthy complete, Prediabetes  Patient verbalizes understanding of teaching instructions: yes  Barriers to learning assessed: none    Nutrition Monitoring and Evaluation:   1) Weight: 5% loss = 132 pounds  2) Physical activity:  Frequency: increased to 5 days weekly, treadmill 45 minutes per session  3) Glucose/Endocrine Profile:  Glucose fasting: < 100  Lipid Profile: Cholesterol ,LDL: <130    Minutes spent providing assessment and education: 45    Report Electronically Signed by: Johnny Bridge, RD

## 2012-08-11 ENCOUNTER — Encounter: Payer: Self-pay | Admitting: Family Medicine

## 2012-08-11 ENCOUNTER — Other Ambulatory Visit: Payer: Self-pay | Admitting: Family Medicine

## 2012-08-11 NOTE — Telephone Encounter (Signed)
From: [name]  To: Kerri Perches, MD  Sent: 08/09/2012 9:33 AM PDT  Subject: Non-urgent Medical Advice Question    I would like to request my prescription of Diazepam 5 mg. be re-filled at [facility]. My last re-fill was 02/27/2012 through Dr. Samuel Jester

## 2012-08-11 NOTE — Telephone Encounter (Signed)
Dr Carlynn Purl . The patient sends a Mychart message 08-11-12 for a refill for Diazepam 5 mg tablet to our [facility]. Scheryl Darter, [facility]

## 2012-08-12 MED ORDER — DIAZEPAM 5 MG TABLET
5.0000 mg | ORAL_TABLET | Freq: Four times a day (QID) | ORAL | 1 refills | Status: DC | PRN
  Filled 2012-08-13: qty 30, 7d supply, fill #0

## 2012-08-12 NOTE — Telephone Encounter (Signed)
Prescription printed out and ready to fax to the pharmacy.

## 2012-08-13 ENCOUNTER — Encounter: Payer: Self-pay | Admitting: Family Medicine

## 2012-08-13 ENCOUNTER — Other Ambulatory Visit: Payer: Self-pay

## 2012-08-13 NOTE — Telephone Encounter (Signed)
The prescription for Diazepam 5 mg tablet our [facility] pharmacy. Scheryl Darter, [facility]

## 2012-08-13 NOTE — Telephone Encounter (Signed)
From: [name]  To: Kerri Perches, MD  Sent: 08/13/2012 11:22 AM PDT  Subject: Non-urgent Medical Advice Question    I would like to request a referral to a dermatologist to have a mole check. Skin cancer runs in our family and I would like to be preventative regarding this.

## 2012-08-17 ENCOUNTER — Ambulatory Visit: Payer: Self-pay

## 2012-08-17 ENCOUNTER — Other Ambulatory Visit: Payer: Self-pay

## 2012-08-17 NOTE — Nursing Note (Signed)
>>   Lavella Lemons, RN     Tue Aug 17, 2012  5:02 PM  Patient here for Immunotherapy.  Patient and antigen were identified with 2 identifiers, including patient's confirmation of correct antigen bottle(s).  Patient's condition was then assessed, which included any immediate or delayed reaction to previous dose, current health status, Peak flow reading if indicated, use of any new meds such as antibiotic or Beta Blocker, if antihistamine had been taken within the previous 24 hours, and length of time since last allergy injection(s).    The allergy injection(s) were then administered per the build up schedule.  Patients dose was advanced.(reduced, repeated, advanced).   Patient received 0.50 of 1:10 Aq Tree/Grass/Weed   .( dose, concentration, name of antigen).  Lavella Lemons, RN      After 30 minutes observation, Patient's injection sites were checked for any local reaction. Patient then left the clinic feeling fine.  Local reaction was 3/3 (wheal/erythema).  Lavella Lemons, RN

## 2012-08-24 ENCOUNTER — Ambulatory Visit: Payer: Self-pay

## 2012-08-24 NOTE — Nursing Note (Signed)
>>   Lavella Lemons, RN     Tue Aug 24, 2012  1:50 PM  Patient here for Immunotherapy.  Patient and antigen were identified with 2 identifiers, including patient's confirmation of correct antigen bottle(s).  Patient's condition was then assessed, which included any immediate or delayed reaction to previous dose, current health status, Peak flow reading if indicated, use of any new meds such as antibiotic or Beta Blocker, if antihistamine had been taken within the previous 24 hours, and length of time since last allergy injection(s).    The allergy injection(s) were then administered per the build up schedule.  Patients dose was advanced.(reduced, repeated, advanced).   Patient received 0.05 of 1:1 Aq Tree/Grass/Weed   .( dose, concentration, name of antigen).  Lavella Lemons, RN      After 30 minutes observation, Patient's injection sites were checked for any local reaction. Patient then left the clinic feeling fine.  Local reaction was 0 (wheal/erythema).  Lavella Lemons, RN

## 2012-09-07 ENCOUNTER — Ambulatory Visit: Payer: Commercial Managed Care - HMO

## 2012-09-07 DIAGNOSIS — J301 Allergic rhinitis due to pollen: Secondary | ICD-10-CM

## 2012-09-07 NOTE — Nursing Note (Signed)
>>   Lavella Lemons, RN     Tue Sep 07, 2012  5:02 PM    After 30 minutes observation, Patient's injection sites were checked for any local reaction. Patient then left the clinic feeling fine.  Local reaction was 5/5 (wheal/erythema).  Lavella Lemons, RN    >> Lavella Lemons, RN     Tue Sep 07, 2012  4:36 PM  Patient here for Immunotherapy.  Patient and antigen were identified with 2 identifiers, including patient's confirmation of correct antigen bottle(s).  Patient's condition was then assessed, which included any immediate or delayed reaction to previous dose, current health status, Peak flow reading if indicated, use of any new meds such as antibiotic or Beta Blocker, if antihistamine had been taken within the previous 24 hours, and length of time since last allergy injection(s).    The allergy injection(s) were then administered per the build up schedule.  Patients dose was advanced.(reduced, repeated, advanced).   Patient received 0.10 of 1:1 Aq Tree/Grass/Weed   .( dose, concentration, name of antigen).  Lavella Lemons, RN

## 2012-09-09 ENCOUNTER — Other Ambulatory Visit: Payer: Self-pay | Admitting: Family Medicine

## 2012-09-09 MED ORDER — BUTALBITAL-ASPIRIN-CAFFEINE 50 MG-325 MG-40 MG CAPSULE
1.0000 | ORAL_CAPSULE | Freq: Four times a day (QID) | ORAL | 1 refills | Status: DC | PRN
  Filled 2012-09-13: qty 30, 8d supply, fill #0
  Filled 2012-11-09: qty 30, 8d supply, fill #1

## 2012-09-09 NOTE — Telephone Encounter (Signed)
Prescription printed out,signed, and faxed to the pharmacy.

## 2012-09-13 ENCOUNTER — Other Ambulatory Visit: Payer: Self-pay

## 2012-09-14 ENCOUNTER — Ambulatory Visit: Payer: Commercial Managed Care - HMO

## 2012-09-14 DIAGNOSIS — J301 Allergic rhinitis due to pollen: Secondary | ICD-10-CM

## 2012-09-14 NOTE — Nursing Note (Signed)
>>   Lavella Lemons, RN     Tue Sep 14, 2012  4:52 PM  Patient here for Immunotherapy.  Patient and antigen were identified with 2 identifiers, including patient's confirmation of correct antigen bottle(s).  Patient's condition was then assessed, which included any immediate or delayed reaction to previous dose, current health status, Peak flow reading if indicated, use of any new meds such as antibiotic or Beta Blocker, if antihistamine had been taken within the previous 24 hours, and length of time since last allergy injection(s).    The allergy injection(s) were then administered per the build up schedule.  Patients dose was advanced.(reduced, repeated, advanced).   Patient received 0.20 of 1:1 Aq Tree/Grass/Weed   .( dose, concentration, name of antigen).  Lavella Lemons, RN      After 30 minutes observation, Patient's injection sites were checked for any local reaction. Patient then left the clinic feeling fine.  Local reaction was 5/20 (wheal/erythema).  Lavella Lemons, RN    >> Lavella Lemons, RN     Tue Sep 14, 2012  4:23 PM  Patient here for Immunotherapy.  Patient and antigen were identified with 2 identifiers, including patient's confirmation of correct antigen bottle(s).  Patient's condition was then assessed, which included any immediate or delayed reaction to previous dose, current health status, Peak flow reading if indicated, use of any new meds such as antibiotic or Beta Blocker, if antihistamine had been taken within the previous 24 hours, and length of time since last allergy injection(s).    The allergy injection(s) were then administered per the build up schedule.  Patients dose was advanced.(reduced, repeated, advanced).   Patient received 0.20 of 1:1 Aq Tree/Grass/Weed   .( dose, concentration, name of antigen).  Lavella Lemons, RN

## 2012-09-21 ENCOUNTER — Ambulatory Visit: Payer: Commercial Managed Care - HMO

## 2012-09-21 DIAGNOSIS — J301 Allergic rhinitis due to pollen: Secondary | ICD-10-CM

## 2012-09-21 NOTE — Nursing Note (Signed)
>>   Lavella Lemons, RN     Tue Sep 21, 2012  4:33 PM    After 30 minutes observation, Patient's injection sites were checked for any local reaction. Patient then left the clinic feeling fine.  Local reaction was 7/33 (wheal/erythema).  Lavella Lemons, RN    >> Lavella Lemons, RN     Tue Sep 21, 2012  4:12 PM  Patient here for Immunotherapy.  Patient and antigen were identified with 2 identifiers, including patient's confirmation of correct antigen bottle(s).  Patient's condition was then assessed, which included any immediate or delayed reaction to previous dose, current health status, Peak flow reading if indicated, use of any new meds such as antibiotic or Beta Blocker, if antihistamine had been taken within the previous 24 hours, and length of time since last allergy injection(s).    The allergy injection(s) were then administered per the build up schedule.  Patients dose was advanced.(reduced, repeated, advanced).   Patient received 0.30 of 1:1 Aq Tree/Grass/Weed   .( dose, concentration, name of antigen).  Lavella Lemons, RN

## 2012-09-28 ENCOUNTER — Ambulatory Visit: Payer: Commercial Managed Care - HMO

## 2012-09-28 DIAGNOSIS — J301 Allergic rhinitis due to pollen: Secondary | ICD-10-CM

## 2012-09-28 NOTE — Nursing Note (Signed)
>>   Bernardo Heater, RN     Tue Sep 28, 2012  4:56 PM  Patient here for Immunotherapy.  Patient and antigen were identified with 2 identifiers, including patient's confirmation of correct antigen bottle(s).  Patient's condition was then assessed, which included any immediate or delayed reaction to previous dose, current health status, Peak flow reading if indicated, use of any new meds such as antibiotic or Beta Blocker, if antihistamine had been taken within the previous 24 hours, and length of time since last allergy injection(s).      The allergy injection(s) were then administered per the build up schedule.  Patients dose was  advanced.  (reduced, repeated, advanced).    Patient received in  Left arm: Aqueous TreesGrassWeeds 1:1 0.40   mL.      After 30 minutes observation, Patient's injection sites were checked for any local reaction, and patient then left the clinic feeling fine.  Local reaction was 7/36.      Injection tolerated well denies itching or other symptoms.     Bernardo Heater, RN

## 2012-10-04 ENCOUNTER — Other Ambulatory Visit: Payer: Self-pay

## 2012-10-05 ENCOUNTER — Ambulatory Visit: Payer: Commercial Managed Care - HMO

## 2012-10-05 DIAGNOSIS — J301 Allergic rhinitis due to pollen: Secondary | ICD-10-CM

## 2012-10-05 NOTE — Nursing Note (Signed)
>>   Lavella Lemons, RN     Tue Oct 05, 2012  5:27 PM  Patient here for Immunotherapy.  Patient and antigen were identified with 2 identifiers, including patient's confirmation of correct antigen bottle(s).  Patient's condition was then assessed, which included any immediate or delayed reaction to previous dose, current health status, Peak flow reading if indicated, use of any new meds such as antibiotic or Beta Blocker, if antihistamine had been taken within the previous 24 hours, and length of time since last allergy injection(s).    The allergy injection(s) were then administered per the build up schedule.  Patients dose was increased.(reduced, repeated, advanced).   Patient received 0.45 of 1:1 Aq Tree/Grass/Weed   .( dose, concentration, name of antigen).  Lavella Lemons, RN      After 30 minutes observation, Patient's injection sites were checked for any local reaction. Patient then left the clinic feeling fine.  Local reaction was 10/36 (wheal/erythema).  Lavella Lemons, RN

## 2012-10-12 ENCOUNTER — Ambulatory Visit: Payer: Commercial Managed Care - HMO

## 2012-10-12 DIAGNOSIS — J301 Allergic rhinitis due to pollen: Secondary | ICD-10-CM

## 2012-10-12 NOTE — Nursing Note (Signed)
>>   Lavella Lemons, RN     Tue Oct 12, 2012  4:58 PM    After 30 minutes observation, Patient's injection sites were checked for any local reaction. Patient then left the clinic feeling fine.  Local reaction was 7/22 (wheal/erythema).  Lavella Lemons, RN    >> Lavella Lemons, RN     Tue Oct 12, 2012  4:27 PM  Patient here for Immunotherapy.  Patient and antigen were identified with 2 identifiers, including patient's confirmation of correct antigen bottle(s).  Patient's condition was then assessed, which included any immediate or delayed reaction to previous dose, current health status, Peak flow reading if indicated, use of any new meds such as antibiotic or Beta Blocker, if antihistamine had been taken within the previous 24 hours, and length of time since last allergy injection(s).    The allergy injection(s) were then administered per the build up schedule.  Patients dose was advanced.(reduced, repeated, advanced).   Patient received 0.50 of 1:1 Aq Tree/Grass/Weed   .( dose, concentration, name of antigen).  Lavella Lemons, RN

## 2012-10-19 ENCOUNTER — Ambulatory Visit: Payer: Commercial Managed Care - HMO

## 2012-10-19 DIAGNOSIS — J301 Allergic rhinitis due to pollen: Secondary | ICD-10-CM

## 2012-10-19 NOTE — Nursing Note (Signed)
>>   Lavella Lemons, RN     Tue Oct 19, 2012  5:28 PM  Patient here for Immunotherapy.  Patient and antigen were identified with 2 identifiers, including patient's confirmation of correct antigen bottle(s).  Patient's condition was then assessed, which included any immediate or delayed reaction to previous dose, current health status, Peak flow reading if indicated, use of any new meds such as antibiotic or Beta Blocker, if antihistamine had been taken within the previous 24 hours, and length of time since last allergy injection(s).    The allergy injection(s) were then administered per the build up schedule.  Patients dose was repeated.(reduced, repeated, advanced).   Patient received 0.50 of 1:1 Aq Tree/Grass/Weed   .( dose, concentration, name of antigen).  Lavella Lemons, RN      After 30 minutes observation, Patient's injection sites were checked for any local reaction. Patient then left the clinic feeling fine.  Local reaction was 10/32 (wheal/erythema).  Lavella Lemons, RN

## 2012-10-25 ENCOUNTER — Encounter: Payer: Self-pay | Admitting: Family Medicine

## 2012-10-25 NOTE — Telephone Encounter (Signed)
From: [name]  To: Kerri Perches, MD  Sent: 10/25/2012 8:19 AM PDT  Subject: Non-urgent Medical Advice Question    I have a dietary supplement "Amberen" that I ordered. The package says if I am taking any prescription medication to consult with my physician prior to taking. With the prescriptions I am currently taking, do you find any reason why I should not take the amberen?

## 2012-10-26 ENCOUNTER — Ambulatory Visit: Payer: Commercial Managed Care - HMO

## 2012-11-01 ENCOUNTER — Ambulatory Visit: Payer: Commercial Managed Care - HMO

## 2012-11-01 DIAGNOSIS — J301 Allergic rhinitis due to pollen: Secondary | ICD-10-CM

## 2012-11-01 NOTE — Nursing Note (Signed)
>>   Lavella Lemons, RN     Mon Nov 01, 2012  3:28 PM  Patient here for Immunotherapy.  Patient and antigen were identified with 2 identifiers, including patient's confirmation of correct antigen bottle(s).  Patient's condition was then assessed, which included any immediate or delayed reaction to previous dose, current health status, Peak flow reading if indicated, use of any new meds such as antibiotic or Beta Blocker, if antihistamine had been taken within the previous 24 hours, and length of time since last allergy injection(s).    The allergy injection(s) were then administered per the build up schedule.  Patients dose was repeated.(reduced, repeated, advanced).   Patient received 0.50 of 1:1 Aq Tree/Grass/Weed   .( dose, concentration, name of antigen).  Lavella Lemons, RN      After 30 minutes observation, Patient's injection sites were checked for any local reaction. Patient then left the clinic feeling fine.  Local reaction was 7/20 (wheal/erythema).  Lavella Lemons, RN

## 2012-11-02 ENCOUNTER — Ambulatory Visit: Payer: Commercial Managed Care - HMO

## 2012-11-09 ENCOUNTER — Other Ambulatory Visit: Payer: Self-pay | Admitting: Rheumatology

## 2012-11-09 ENCOUNTER — Ambulatory Visit: Payer: Commercial Managed Care - HMO

## 2012-11-09 ENCOUNTER — Other Ambulatory Visit: Payer: Self-pay

## 2012-11-09 DIAGNOSIS — J301 Allergic rhinitis due to pollen: Secondary | ICD-10-CM

## 2012-11-09 MED ORDER — OLOPATADINE 0.1 % EYE DROPS
1.0000 [drp] | Freq: Two times a day (BID) | OPHTHALMIC | 0 refills | Status: DC | PRN
Start: 2012-11-09 — End: 2013-04-15
  Filled 2012-11-09: qty 5, 25d supply, fill #0

## 2012-11-09 NOTE — Nursing Note (Signed)
>>   Lavella Lemons, RN     Tue Nov 09, 2012  5:10 PM  Patient here for Immunotherapy.  Patient and antigen were identified with 2 identifiers, including patient's confirmation of correct antigen bottle(s).  Patient's condition was then assessed, which included any immediate or delayed reaction to previous dose, current health status, Peak flow reading if indicated, use of any new meds such as antibiotic or Beta Blocker, if antihistamine had been taken within the previous 24 hours, and length of time since last allergy injection(s).    The allergy injection(s) were then administered per the build up schedule.  Patients dose was repeated.(reduced, repeated, advanced).   Patient received 0.50 of 1:1 Aq Tree/Grass/Weed   .( dose, concentration, name of antigen).  Lavella Lemons, RN      After 30 minutes observation, Patient's injection sites were checked for any local reaction. Patient then left the clinic feeling fine.  Local reaction was 4/45 (wheal/erythema).  Lavella Lemons, RN

## 2012-11-10 ENCOUNTER — Other Ambulatory Visit: Payer: Self-pay

## 2012-11-11 ENCOUNTER — Ambulatory Visit: Payer: Commercial Managed Care - HMO

## 2012-11-15 ENCOUNTER — Other Ambulatory Visit: Payer: Self-pay

## 2012-11-16 ENCOUNTER — Ambulatory Visit: Payer: Commercial Managed Care - HMO | Admitting: Family Medicine

## 2012-11-16 VITALS — BP 100/62 | HR 84 | Temp 98.5°F | Resp 12 | Wt 142.0 lb

## 2012-11-16 MED ORDER — LOVASTATIN 40 MG TABLET
1.0000 | ORAL_TABLET | Freq: Every day | ORAL | 1 refills | Status: DC
Start: 2012-11-16 — End: 2012-11-16
  Filled 2012-11-16: qty 30, 30d supply, fill #0

## 2012-11-16 MED ORDER — LOVASTATIN 40 MG TABLET
1.0000 | ORAL_TABLET | Freq: Every day | ORAL | 3 refills | Status: DC
Start: 2012-11-16 — End: 2013-04-14
  Filled 2012-11-16 – 2012-12-06 (×2): qty 90, 90d supply, fill #0
  Filled 2013-02-28: qty 90, 90d supply, fill #1

## 2012-11-16 NOTE — Patient Instructions (Signed)
Plantar Fasciitis: After Your Visit  Your Care Instructions  Plantar fasciitis is pain and inflammation of the plantar fascia, the tissue at the bottom of your foot that connects the heel bone to the toes. The plantar fascia also supports the arch. If you strain the plantar fascia, it can develop small tears and cause heel pain when you stand or walk.  Plantar fasciitis can be caused by running or other sports. It also may occur in people who are overweight or who have high arches or flat feet. You may get plantar fasciitis if you walk or stand for long periods, or have a tight Achilles tendon or calf muscles.  You can improve your foot pain with rest and other care at home. It might take a few weeks to a few months for your foot to heal completely.  Follow-up care is a key part of your treatment and safety. Be sure to make and go to all appointments, and call your doctor if you are having problems. It's also a good idea to know your test results and keep a list of the medicines you take.  How can you care for yourself at home?  · Rest your feet often. Reduce your activity to a level that lets you avoid pain. If possible, do not run or walk on hard surfaces.  · Take pain medicines exactly as directed.  ¨ If the doctor gave you a prescription medicine for pain, take it as prescribed.  ¨ If you are not taking a prescription pain medicine, take an over-the-counter anti-inflammatory medicine for pain and swelling, such as ibuprofen (Advil, Motrin) or naproxen (Aleve). Read and follow all instructions on the label.  · Use ice massage to help with pain and swelling. You can use an ice cube or an ice cup several times a day. To make an ice cup, fill a paper cup with water and freeze it. Cut off the top of the cup until a half-inch of ice shows. Hold onto the remaining paper to use the cup. Rub the ice in small circles over the area for 5 to 7 minutes.   · Contrast baths, which alternate hot and cold water, can also help reduce swelling. But because heat alone may make pain and swelling worse, end a contrast bath with a soak in cold water.  · Wear a night splint if your doctor suggests it. A night splint holds your foot with the toes pointed up and the foot and ankle at a 90-degree angle. This position gives the bottom of your foot a constant, gentle stretch.  · Do simple exercises such as calf stretches and towel stretches 2 to 3 times each day, especially when you first get up in the morning. These can help the plantar fascia become more flexible. They also make the muscles that support your arch stronger. Hold these stretches for 15 to 30 seconds per stretch. Repeat 2 to 4 times.  ¨ Stand about 1 foot from a wall. Place the palms of both hands against the wall at chest level. Lean forward against the wall, keeping one leg with the knee straight and heel on the ground while bending the knee of the other leg.  ¨ Sit down on the floor or a mat with your feet stretched in front of you. Roll up a towel lengthwise, and loop it over the ball of your foot. Holding the towel at both ends, gently pull the towel toward you to stretch your foot.  · Wear   shoes with good arch support. Athletic shoes or shoes with a well-cushioned sole are good choices.  · Try heel cups or shoe inserts (orthotics) to help cushion your heel. You can buy these at many shoe stores.  · Put on your shoes as soon as you get out of bed. Going barefoot or wearing slippers may make your pain worse.  · Reach and stay at a good weight for your height. This puts less strain on your feet.  When should you call for help?  Call your doctor now or seek immediate medical care if:  · You have heel pain with fever, redness, or warmth in your heel.  · You cannot put weight on the sore foot.  Watch closely for changes in your health, and be sure to contact your doctor if:   · You have numbness or tingling in your heel.  · Your heel pain lasts more than 2 weeks.   Where can you learn more?   Go to https://www.healthwise.net/patiented  Enter X351 in the search box to learn more about "Plantar Fasciitis: After Your Visit."   © 2006-2013 Healthwise, Incorporated. Care instructions adapted under license by [facility]. This care instruction is for use with your licensed healthcare professional. If you have questions about a medical condition or this instruction, always ask your healthcare professional. Healthwise, Incorporated disclaims any warranty or liability for your use of this information.  Content Version: 9.7.145117; Last Revised: May 06, 2011              Plantar Fasciitis: Exercises  Your Care Instructions  Here are some examples of typical rehabilitation exercises for your condition. Start each exercise slowly. Ease off the exercise if you start to have pain.  Your doctor or physical therapist will tell you when you can start these exercises and which ones will work best for you.  How to do the exercises  Note: Each exercise should create a pulling feeling but should not cause pain.  Towel stretch    1. Sit with your legs extended and knees straight.  2. Place a towel around your foot just under the toes. A towel will give you a more effective stretch.  3. Hold each end of the towel in each hand, with your hands above your knees.  4. Pull back with the towel so that your foot stretches toward you.  5. Hold the position for at least 15 to 30 seconds.  6. Repeat 2 to 4 times a session, up to 5 sessions a day.  Calf stretch    Note: This exercise stretches the muscles at the back of the lower leg (the calf) and the Achilles tendon. Do this exercise 3 or 4 times a day, 5 days a week.  1. Stand facing a wall with your hands on the wall at about eye level. Put the leg you want to stretch about a step behind your other leg.   2. Keeping your back heel on the floor, bend your front knee until you feel a stretch in the back leg.  3. Hold the stretch for 15 to 30 seconds. Repeat 2 to 4 times.  Plantar fascia and calf stretch    Note: Stretching the plantar fascia and calf muscles can increase flexibility and decrease heel pain. You can do this exercise several times each day and before and after activity.  1. Stand on a step as shown above. Be sure to hold on to the banister.  2. Slowly   let your heels down over the edge of the step as you relax your calf muscles. You should feel a gentle stretch across the bottom of your foot and up the back of your leg to your knee.  3. Hold the stretch about 15 to 30 seconds, and then tighten your calf muscle a little to bring your heel back up to the level of the step. Repeat 2 to 4 times.  Towel curls    1. While sitting, place your foot on a towel on the floor and scrunch the towel toward you with your toes.  2. Then, also using your toes, push the towel away from you.  Note: Make this exercise more challenging by placing a weighted object, such as a soup can, on the other end of the towel.  Marble pickups    1. Put marbles on the floor next to a cup.  2. Using your toes, try to lift the marbles up from the floor and put them in the cup.  Follow-up care is a key part of your treatment and safety. Be sure to make and go to all appointments, and call your doctor if you are having problems. It's also a good idea to know your test results and keep a list of the medicines you take.   Where can you learn more?   Go to https://www.healthwise.net/patiented  Enter X377 in the search box to learn more about "Plantar Fasciitis: Exercises."   © 2006-2013 Healthwise, Incorporated. Care instructions adapted under license by [facility]. This care instruction is for use with your licensed healthcare professional. If you have questions about a  medical condition or this instruction, always ask your healthcare professional. Healthwise, Incorporated disclaims any warranty or liability for your use of this information.  Content Version: 9.7.145117; Last Revised: January 11, 2010

## 2012-11-16 NOTE — Nursing Note (Signed)
>>   Marisa Jenkins     Tue Nov 16, 2012  3:14 PM  vital signs taken, allergies verified,screened for pain,and medication history taken.  Scheryl Darter, [facility]

## 2012-11-17 ENCOUNTER — Other Ambulatory Visit: Payer: Self-pay

## 2012-11-17 NOTE — Progress Notes (Signed)
11/17/2012   EPIC FAMILY MEDICINE VISIT: MRN# [id number]  Name: Marisa Jenkins  Date of birth: Sep 04, 1955  Chief Complaint   Patient presents with    Foot Problem     left heel in the sole of foot under heel 3 weeks, no injury    Skin Problem     mole    Skin Problem     possible skin tags around the eyes       2:25 AM     SUBJECTIVE:  Marisa Jenkins is a 57yr old female who is here for multiple problems:    Pain in the under side of her left heel. She has had this problem for the past 2 months, and denies any history of injury, but has been walking more. It hurts when she steps down on it, and minimal pain when she's not walking. Does where flat sold shoes which seem to make it worse. No bruising, has not tried any treatment.    Skin tags, on her face, there are 2 on the left eyelid, which are impairing her vision, and are irritating because one of them is at the corner of her eye, and gets caught. She would like to have them removed.    Also has cystic lumps on her face, which have been there for at least the past several years. There is one on her right cheek, which periodically becomes inflamed and tender. The one on her chin does not bother her as much.     Has several skin moles she would like to have checked.    History of elevated sugar, and cholesterol, she would like to have these rechecked.       Patient Active Problem List   Diagnosis    Hyperlipidemia    Postmenopausal HRT (hormone replacement therapy)    Migraine headache    Allergic rhinitis    Anxiety disorder    Impaired fasting glucose    Pain in joint of right hand    Internal hemorrhoids    Allergic rhinitis due to pollen       REVIEW of SYSTEMS :  Constitutional: negative, no recent weight changes.  Eyes: skin tags on left eyelid, causing rubbing, and some impairment with vision since it hangs down in her view in the corner of her eye.  Ears, Nose, Mouth, Throat: negative.  CV: negative.  Resp: negative.  Diabetic ROS - diabetic diet  compliance:  noncompliant some of the time, home glucose monitoring: are not performed, last eye exam approximately one year ago, no retinopathy   GU: negative, denies any nocturia, or frequent urination.  Musculoskeletal: negative.  Integumentary: negative.  Psych: euthymic.     OBJECTIVE:  Vital signs:    BP 100/62  Pulse 84  Temp(Src) 36.9 C (98.5 F) (Oral)  Resp 12  Wt 64.411 kg (142 lb)  BMI 24.18 kg/m2  LMP 10/28/2010   General Appearance: healthy, alert, no distress, pleasant affect, cooperative.  Eyes:  conjunctivae and corneas clear. PERRL, EOM's intact. sclerae normal.  Mouth: normal.  Neck:  Neck supple. No adenopathy, thyroid symmetric, normal size.  Heart:  normal rate and regular rhythm, no murmurs, clicks, or gallops.  Lungs: clear to auscultation.  Extremities:  no cyanosis, clubbing, or edema and distal pulses normal.  Skin:  Has 2 elongated skin tags on a stock on the left lateral eyelid one is approximately 3 mm long in the lateral campus, and the other one is on the mid lateral  eyelid 2mm on a stalk, also has 2 at the dermoid inclusion cysts, one on the right cheek, and another on the chin. .    Exam shows tenderness to the inferomedial aspect of the heel left. No overlying skin changes or bruises, for range of motion of the foot, with dorsi flexion causing increasing pain    ASSESSMENT:  (728.71) Plantar fasciitis of left foot  (primary encounter diagnosis)  Comment:   Plan: FOOT 2 VIEWS, LEFT        See pt instructions, stretches, and x-ray was ordered in cases may be bone spur    (701.9) Skin tag  Comment: reviewed benign nature of the lesions, but since the skin tags are hanging in [place], agreed with removal  Plan: DESTRUCTION BENIGN LESIONS UP TO 14        CRYOTHERAPY: Verbal informed consent for cryotherapy was obtained after cryotherapy was explained. Risks include pain, blistering, skin pigment changes, skin infection, scarring and failure to resolve the condition. [name]  indicates she understands and agrees to proceed. Both of the skin tag lesion(s) as described above destroyed with liquid nitrogen cryotherapy. The procedure was tolerated well. There were no complications. Postprocedural care was discussed. Return in 2-4 weeks if lesion(s) don't resolve.     (706.2) Epidermoid cyst  Comment: again benign lesions, these have been more a nuisance since they get rubbed, and she picks on them so they swell.  Plan: DESTRUCTION BENIGN LESIONS UP TO 14, DRAIN SKIN        ABSCESS SIMPLE        INCISION & DRAINAGE:  Verbal informed consent was obtained after incision and drainage was explained. Risks include anesthetic reaction, pain, bleeding, skin infection, scarring, failure to resolve the condition and/or other unforeseen complications. [name] indicates she understands and agrees to proceed. The location of the lesion was verified by the patient. The site was prepped with isopropanol, no anesthesia was necessary. The lesion was incised  ( tiny incision) with a #11 scalpel blade. White sebaceous material was expressed. A culture was not obtained. Packing material was not placed. The procedure was tolerated well. There were no complications. Postprocedural care was discussed. Signs and symptoms of wound infection were discussed. No sutures or other closure were indicated due to size of the incision extremely small both lesions were in size, the right cheek one was drained well, the left one only a small amount drained    (702.19) Seborrheic keratosis  Comment:   Plan: on trunk, and one on the left thigh, appears typical advised no treatment    (790.29) Elevated blood sugar  Comment:   Plan: BASIC METABOLIC PANEL            (272.4) Hyperlipidemia  Comment: advised to discontinue the Lipitor, since it has been shown to elevate blood sugar  Plan: Lovastatin (MEVACOR) 40 mg tablet,         DISCONTINUED: Lovastatin (MEVACOR) 40 mg tablet            (790.21) Impaired fasting  glucose  Comment: low sugar diet reviewed  Plan: BASIC METABOLIC PANEL, HEMOGLOBIN A1C            Advised to call back if symptoms fail to improve as expected.     Total visit time: 35 minutes, of which  20 % was spent face-to-face counseling.   I reviewed the patient's past medical and family/social history, and updated problem list and past medical history .  Barriers  to Learning assessed: none. Patient verbalizes understanding of teaching and instructions.  [name], MD  Family Practice, [facility]  [facility]     This document has been created using Dragon Naturally Speaking and has been checked for content. Some voice recognition errors may have been missed inadvertantly.

## 2012-11-21 ENCOUNTER — Other Ambulatory Visit: Payer: Self-pay

## 2012-11-26 ENCOUNTER — Ambulatory Visit: Payer: Commercial Managed Care - HMO

## 2012-11-26 DIAGNOSIS — J301 Allergic rhinitis due to pollen: Secondary | ICD-10-CM

## 2012-11-26 NOTE — Nursing Note (Signed)
>>   Lavella Lemons, RN     Caleen Essex Nov 26, 2012  9:23 AM    After 30 minutes observation, Patient's injection sites were checked for any local reaction. Patient then left the clinic feeling fine.  Local reaction was 5/11 (wheal/erythema).  Lavella Lemons, RN    >> Lavella Lemons, RN     Caleen Essex Nov 26, 2012  8:14 AM  Patient here for Immunotherapy.  Patient and antigen were identified with 2 identifiers, including patient's confirmation of correct antigen bottle(s).  Patient's condition was then assessed, which included any immediate or delayed reaction to previous dose, current health status, Peak flow reading if indicated, use of any new meds such as antibiotic or Beta Blocker, if antihistamine had been taken within the previous 24 hours, and length of time since last allergy injection(s).    The allergy injection(s) were then administered per the build up schedule.  Patients dose was repeated.(reduced, repeated, advanced).   Patient received 0.50 of 1:1 Aq Tree/Grass/Weed   .( dose, concentration, name of antigen).  Lavella Lemons, RN

## 2012-11-27 ENCOUNTER — Other Ambulatory Visit: Payer: Self-pay

## 2012-12-06 ENCOUNTER — Other Ambulatory Visit: Payer: Self-pay

## 2012-12-11 ENCOUNTER — Other Ambulatory Visit: Payer: Self-pay

## 2012-12-14 ENCOUNTER — Ambulatory Visit: Payer: Commercial Managed Care - HMO

## 2012-12-14 DIAGNOSIS — J301 Allergic rhinitis due to pollen: Secondary | ICD-10-CM

## 2012-12-14 NOTE — Nursing Note (Signed)
>>   Lavella Lemons, RN     Tue Dec 14, 2012  5:15 PM  Patient here for Immunotherapy.  Patient and antigen were identified with 2 identifiers, including patient's confirmation of correct antigen bottle(s).  Patient's condition was then assessed, which included any immediate or delayed reaction to previous dose, current health status, Peak flow reading if indicated, use of any new meds such as antibiotic or Beta Blocker, if antihistamine had been taken within the previous 24 hours, and length of time since last allergy injection(s).    The allergy injection(s) were then administered per the build up schedule.  Patients dose was repeated.(reduced, repeated, advanced).   Patient received 0.50 of 1:1 Aq Tree/Grass/Weed   .( dose, concentration, name of antigen).  Lavella Lemons, RN      After 30 minutes observation, Patient's injection sites were checked for any local reaction. Patient then left the clinic feeling fine.  Local reaction was 4/4 (wheal/erythema).  Lavella Lemons, RN

## 2012-12-15 ENCOUNTER — Encounter: Payer: Self-pay | Admitting: Family Medicine

## 2012-12-15 NOTE — Telephone Encounter (Signed)
From: [name]  To: Kerri Perches, MD  Sent: 12/14/2012 2:35 PM PDT  Subject: Non-urgent Medical Advice Question    My previous health care provider [facility] keeps sending me a reminder it is time for my annual mammogram. I had a lump a few years back. It was benign and they marked it with a titanium chip. Since then, they have had me follow up on annual visits. I would like to be referred to have my annual mammogram.

## 2012-12-17 NOTE — Telephone Encounter (Signed)
I have ordered the screening mammogram, so you can call the [facility] radiology department at [phone] to schedule the mammogram appointment. Freddie Breech, M.D.

## 2012-12-22 ENCOUNTER — Encounter: Payer: Self-pay | Admitting: Family Medicine

## 2012-12-30 ENCOUNTER — Other Ambulatory Visit: Payer: Self-pay

## 2012-12-30 ENCOUNTER — Other Ambulatory Visit: Payer: Self-pay | Admitting: Family Medicine

## 2012-12-31 MED ORDER — BUTALBITAL-ASPIRIN-CAFFEINE 50 MG-325 MG-40 MG CAPSULE
1.0000 | ORAL_CAPSULE | Freq: Four times a day (QID) | ORAL | 1 refills | Status: DC | PRN
  Filled 2013-01-03: qty 30, 8d supply, fill #0
  Filled 2013-03-14: qty 30, 8d supply, fill #1

## 2012-12-31 NOTE — Telephone Encounter (Signed)
Prescription has been printed out and faxed to the pharmacy. Freddie Breech, M.D.

## 2013-01-03 ENCOUNTER — Other Ambulatory Visit: Payer: Self-pay

## 2013-01-04 ENCOUNTER — Ambulatory Visit: Payer: Commercial Managed Care - HMO

## 2013-01-04 ENCOUNTER — Other Ambulatory Visit: Payer: Self-pay

## 2013-01-04 ENCOUNTER — Ambulatory Visit (INDEPENDENT_AMBULATORY_CARE_PROVIDER_SITE_OTHER): Payer: Commercial Managed Care - HMO

## 2013-01-04 ENCOUNTER — Other Ambulatory Visit: Payer: Self-pay | Admitting: OBSTETRICS/GYN

## 2013-01-04 DIAGNOSIS — J301 Allergic rhinitis due to pollen: Secondary | ICD-10-CM

## 2013-01-04 NOTE — Nursing Note (Signed)
>>   Lavella Lemons, RN     Tue Jan 04, 2013  5:24 PM  The Influenza Vaccine VIS document for the flu injection was given to patient to review. Patient or person named in permission has answered no to any history of egg allergy, previous serious reaction to a influenza vaccine or current illness which would preclude them receiving an immunization. Any questions were referred to the physician. The Influenza Vaccine was then administered per protocol. The patient was observed for immediate reactions to the vaccine per protocol. None were observed.    Lavella Lemons, RN

## 2013-01-04 NOTE — Nursing Note (Signed)
>>   Lavella Lemons, RN     Tue Jan 04, 2013  5:21 PM    After 30 minutes observation, Patient's injection sites were checked for any local reaction. Patient then left the clinic feeling fine.  Local reaction was 3/33 (wheal/erythema).  Lavella Lemons, RN    >> Lavella Lemons, RN     Tue Jan 04, 2013  4:46 PM  Patient here for Immunotherapy.  Patient and antigen were identified with 2 identifiers, including patient's confirmation of correct antigen bottle(s).  Patient's condition was then assessed, which included any immediate or delayed reaction to previous dose, current health status, Peak flow reading if indicated, use of any new meds such as antibiotic or Beta Blocker, if antihistamine had been taken within the previous 24 hours, and length of time since last allergy injection(s).    The allergy injection(s) were then administered per the build up schedule.  Patients dose was repeated.(reduced, repeated, advanced).   Patient received 0.50 of 1:1 Aq Tree/Grass/Weed   .( dose, concentration, name of antigen).  Lavella Lemons, RN

## 2013-01-06 MED ORDER — ESTRADIOL 0.05 MG-NORETHINDRONE 0.25 MG/24 HR SEMIWKLY TRANSDERM PATCH
1.0000 | MEDICATED_PATCH | TRANSDERMAL | 0 refills | Status: DC
Start: 2013-01-04 — End: 2013-04-04
  Filled 2013-01-06: qty 24, 84d supply, fill #0

## 2013-01-06 NOTE — Telephone Encounter (Signed)
Needs followup mammogram done.  This has been ordered earlier this month.  Only one refill sent.  Freddie Breech, M.D.

## 2013-01-10 ENCOUNTER — Other Ambulatory Visit: Payer: Self-pay

## 2013-01-22 ENCOUNTER — Other Ambulatory Visit: Payer: Self-pay

## 2013-02-01 ENCOUNTER — Ambulatory Visit: Payer: Commercial Managed Care - HMO

## 2013-02-01 DIAGNOSIS — J301 Allergic rhinitis due to pollen: Secondary | ICD-10-CM

## 2013-02-01 NOTE — Nursing Note (Signed)
>>   Lavella Lemons, RN     Tue Feb 01, 2013 10:43 AM    After 30 minutes observation, Patient's injection sites were checked for any local reaction. Patient then left the clinic feeling fine.  Local reaction was 4/26 (wheal/erythema).  Lavella Lemons, RN    >> Lavella Lemons, RN     Tue Feb 01, 2013  9:40 AM  Patient here for Immunotherapy.  Patient and antigen were identified with 2 identifiers, including patient's confirmation of correct antigen bottle(s).  Patient's condition was then assessed, which included any immediate or delayed reaction to previous dose, current health status, Peak flow reading if indicated, use of any new meds such as antibiotic or Beta Blocker, if antihistamine had been taken within the previous 24 hours, and length of time since last allergy injection(s).    The allergy injection(s) were then administered per the build up schedule.  Patients dose was repeated.(reduced, repeated, advanced).   Patient received 0.50 of 1:1 Aq Tree/Grass/Weed .( dose, concentration, name of antigen).  Lavella Lemons, RN

## 2013-02-08 ENCOUNTER — Ambulatory Visit: Payer: Commercial Managed Care - HMO

## 2013-02-25 ENCOUNTER — Other Ambulatory Visit: Payer: Self-pay | Admitting: Family Medicine

## 2013-02-28 ENCOUNTER — Other Ambulatory Visit: Payer: Self-pay

## 2013-02-28 ENCOUNTER — Other Ambulatory Visit: Payer: Self-pay | Admitting: Family Medicine

## 2013-03-02 ENCOUNTER — Other Ambulatory Visit: Payer: Self-pay

## 2013-03-02 ENCOUNTER — Ambulatory Visit: Payer: Commercial Managed Care - HMO | Attending: Family Medicine

## 2013-03-02 ENCOUNTER — Ambulatory Visit: Payer: Commercial Managed Care - HMO

## 2013-03-02 DIAGNOSIS — R7301 Impaired fasting glucose: Secondary | ICD-10-CM | POA: Insufficient documentation

## 2013-03-02 DIAGNOSIS — R7309 Other abnormal glucose: Secondary | ICD-10-CM | POA: Insufficient documentation

## 2013-03-02 DIAGNOSIS — J301 Allergic rhinitis due to pollen: Secondary | ICD-10-CM

## 2013-03-02 MED ORDER — DIAZEPAM 5 MG TABLET
5.0000 mg | ORAL_TABLET | Freq: Four times a day (QID) | ORAL | 1 refills | Status: DC | PRN
  Filled 2013-03-02: qty 30, 8d supply, fill #0
  Filled 2013-07-12: qty 30, 8d supply, fill #1

## 2013-03-02 NOTE — Telephone Encounter (Signed)
Prescription printed out and ready to fax to the pharmacy. Issaiah Seabrooks, MD

## 2013-03-02 NOTE — Telephone Encounter (Signed)
The prescription for Diazepam 5 mg tablet has been faxed to our clinic [facility] [place]. Scheryl Darter, [facility]

## 2013-03-02 NOTE — Nursing Note (Signed)
>>   Bernardo Heater, RN     Wed Mar 02, 2013 11:12 AM  Patient here for Immunotherapy.  Patient and antigen were identified with 2 identifiers, including patient's confirmation of correct antigen bottle(s).  Patient's condition was then assessed, which included any immediate or delayed reaction to previous dose, current health status, Peak flow reading if indicated, use of any new meds such as antibiotic or Beta Blocker, if antihistamine had been taken within the previous 24 hours, and length of time since last allergy injection(s).      The allergy injection(s) were then administered per the build up schedule.  Patients dose was  repeated.  (reduced, repeated, advanced).    Patient received in  Left arm: Aqueous Trees/Grass/Weed 1:1 0.50   mL.      After 30 minutes observation, Patient's injection sites were checked for any local reaction, and patient then left the clinic feeling fine.  Local reaction was 8/40.      Injection tolerated well denies itching or other symptoms.     Bernardo Heater, RN

## 2013-03-07 ENCOUNTER — Ambulatory Visit
Admission: RE | Admit: 2013-03-07 | Discharge: 2013-03-07 | Disposition: A | Discharge status: Home / Self Care | Payer: Commercial Managed Care - HMO | Source: Ambulatory Visit | Attending: Family Medicine | Admitting: Family Medicine

## 2013-03-07 DIAGNOSIS — Z1239 Encounter for other screening for malignant neoplasm of breast: Secondary | ICD-10-CM | POA: Insufficient documentation

## 2013-03-08 ENCOUNTER — Telehealth: Payer: Commercial Managed Care - HMO | Admitting: Family Medicine

## 2013-03-08 ENCOUNTER — Encounter: Payer: Self-pay | Admitting: Family Medicine

## 2013-03-08 NOTE — Telephone Encounter (Signed)
Patient's daugher-in-law is pregnant and due to deliver on January 8th.   Her physician has recommended that patient be vaccinated for whooping cough to protect the newborn.  Please order and have staff notify patient when order is available so she can come in for the vaccine.

## 2013-03-08 NOTE — Telephone Encounter (Signed)
Agree, vaccine ordered. My chart message sent to patient.  Please assist with scheduling a nurse appointment. Freddie Breech, MD

## 2013-03-11 ENCOUNTER — Ambulatory Visit: Payer: Commercial Managed Care - HMO

## 2013-03-11 DIAGNOSIS — Z23 Encounter for immunization: Secondary | ICD-10-CM

## 2013-03-11 NOTE — Nursing Note (Signed)
>>   Smith Mince, LVN     Fri Mar 11, 2013  4:00 PM  Immunization VIS documentation(s) were given to Marisa Jenkins to review. All questions were answered and the patient consented to the Immunization(s) being given. Patient allergies were reviewed and no contraindications were found. The immunization(s) were given as ordered. The patient was observed for any immediate reactions to the vaccine. None were observed. Smith Mince, LVN

## 2013-03-15 ENCOUNTER — Other Ambulatory Visit: Payer: Self-pay

## 2013-03-16 ENCOUNTER — Other Ambulatory Visit: Payer: Self-pay

## 2013-03-21 ENCOUNTER — Other Ambulatory Visit: Payer: Self-pay

## 2013-03-21 ENCOUNTER — Inpatient Hospital Stay: Admit: 2013-03-21 | Discharge: 2013-03-21 | Disposition: A | Discharge status: Home / Self Care | Payer: Self-pay

## 2013-03-25 ENCOUNTER — Ambulatory Visit: Payer: Commercial Managed Care - HMO

## 2013-03-25 DIAGNOSIS — J301 Allergic rhinitis due to pollen: Secondary | ICD-10-CM

## 2013-03-25 NOTE — Nursing Note (Signed)
>>   Suzan Slick, RN     Marisa Jenkins Mar 25, 2013 10:57 AM    After 30 minutes observation, Patient's injection sites were checked for any local reaction. Patient then left the clinic feeling fine.  Local reaction was 4/20 (wheal/erythema).  Suzan Slick, RN    >> Suzan Slick, RN     Marisa Jenkins Mar 25, 2013 10:53 AM  Patient here for Immunotherapy.  Patient and antigen were identified with 2 identifiers, including patient's confirmation of correct antigen bottle(s).  Patient's condition was then assessed, which included any immediate or delayed reaction to previous dose, current health status, Peak flow reading if indicated, use of any new meds such as antibiotic or Beta Blocker, if antihistamine had been taken within the previous 24 hours, and length of time since last allergy injection(s).    The allergy injection(s) were then administered per the build up schedule.  Patients dose was repeated.(reduced, repeated, advanced).   Patient received 0.50 of 1:1 Aq Tree/Grass/Weed .( dose, concentration, name of antigen).  Suzan Slick, RN

## 2013-04-04 ENCOUNTER — Other Ambulatory Visit: Payer: Self-pay | Admitting: Family Medicine

## 2013-04-05 MED ORDER — ESTRADIOL 0.05 MG-NORETHINDRONE 0.25 MG/24 HR SEMIWKLY TRANSDERM PATCH
1.0000 | MEDICATED_PATCH | TRANSDERMAL | 1 refills | Status: DC
Start: 2013-04-05 — End: 2013-10-10
  Filled 2013-04-05: qty 24, 84d supply, fill #0
  Filled 2013-07-08: qty 24, 84d supply, fill #1

## 2013-04-07 ENCOUNTER — Other Ambulatory Visit: Payer: Self-pay

## 2013-04-14 ENCOUNTER — Other Ambulatory Visit: Payer: Self-pay

## 2013-04-14 ENCOUNTER — Ambulatory Visit: Payer: Commercial Managed Care - HMO | Admitting: Family Medicine

## 2013-04-14 VITALS — BP 104/64 | HR 64 | Temp 98.4°F | Resp 14 | Wt 144.6 lb

## 2013-04-14 DIAGNOSIS — R7301 Impaired fasting glucose: Secondary | ICD-10-CM

## 2013-04-14 DIAGNOSIS — L723 Sebaceous cyst: Secondary | ICD-10-CM

## 2013-04-14 DIAGNOSIS — E785 Hyperlipidemia, unspecified: Principal | ICD-10-CM

## 2013-04-14 DIAGNOSIS — H1013 Acute atopic conjunctivitis, bilateral: Secondary | ICD-10-CM

## 2013-04-14 DIAGNOSIS — J309 Allergic rhinitis, unspecified: Secondary | ICD-10-CM

## 2013-04-14 DIAGNOSIS — L729 Follicular cyst of the skin and subcutaneous tissue, unspecified: Secondary | ICD-10-CM

## 2013-04-14 MED ORDER — ATORVASTATIN 40 MG TABLET
40.0000 mg | ORAL_TABLET | Freq: Every day | ORAL | 3 refills | Status: DC
  Filled 2013-04-14: qty 90, 90d supply, fill #0
  Filled 2013-07-08: qty 90, 90d supply, fill #1
  Filled 2013-10-08: qty 90, 90d supply, fill #2
  Filled 2014-01-15: qty 90, 90d supply, fill #3

## 2013-04-14 NOTE — Patient Instructions (Signed)
I have ordered your fasting blood tests.  You can drop in to the downstairs lab any time between 7:30 am and 4pm, except closed for lunch 12:30-1PM. (These are the new lab hours and appointments are no longer needed).  Paola Flynt, MD

## 2013-04-14 NOTE — Nursing Note (Signed)
>>   Cleaster Corin     Thu Apr 14, 2013  3:06 PM  Vital signs taken, allergies verified, screened for pain, medication  history taken.  Cleaster Corin, LVN

## 2013-04-15 ENCOUNTER — Other Ambulatory Visit: Payer: Self-pay

## 2013-04-15 MED ORDER — OLOPATADINE 0.1 % EYE DROPS
1.0000 [drp] | Freq: Two times a day (BID) | OPHTHALMIC | 0 refills | Status: AC | PRN
Start: 2013-04-15 — End: 2013-05-15
  Filled 2013-04-15: qty 5, 25d supply, fill #0

## 2013-04-15 NOTE — Progress Notes (Signed)
04/15/2013   EP[facility] FAMILY [facility]ICINE VISIT: MRN# [id number]  Name: Marisa Jenkins  Date of birth: [birth year]  Chief Complaint   Patient presents with    Test Results     follow up labs 02/2013    Refill Request     Patanol       12:33 AM     SUBJECTIVE:  Marisa Jenkins is a 58yr old female who is here for a Followup on recent lab test results, which show elevated cholesterol the bite taking 40 mg of Mevacor.  Did better with Lipitor in the past, but she was concerned about elevated sugars.  Has been trying to work with diet.  Also cyst on her skin, which has been there for the last few months.  It is right next to her eye on the left side.  Medial canthus.      She also has allergies, and request refill for allergy eyedrops.Does take oral antihistamines intermittently as well to       Patient Active Problem List   Diagnosis    Hyperlipidemia    Postmenopausal HRT (hormone replacement therapy)    Migraine headache    Allergic rhinitis    Anxiety disorder    Impaired fasting glucose    Pain in joint of right hand    Internal hemorrhoids    Allergic rhinitis due to pollen       REVIEW of SYSTEMS :  Constitutional: negative.  Eyes:itchy eyes, allergy eyes intermittently with redness no drainage  Ears, Nose, Mouth, Throat: nasal congestion, sneezing itchy nose.  CV: negative.  Resp: negative.  GI: negative.  Musculoskeletal: negative.  Integumentary: lumps or bumps, right next to the left thigh, did get some drainage out of it today after poking it with a needle.Marland Kitchen  Neuro: negative.  Psych: Mood pt's report, euthymic.  Allergy/Immun: hay fever, itchy eyes, itchy nose.  GYN: negative.     OBJECTIVE:  Vital signs:    BP 104/64   Pulse 64   Temp(Src) 36.9 C (98.4 F) (Tympanic)   Resp 14   Wt 65.573 kg (144 lb 9 oz)   BMI 24.62 kg/m2   LMP 10/28/2010   General Appearance: healthy, alert, no distress, pleasant affect, cooperative.  Eyes:  conjunctivae and corneas clear. PERRL, EOM's intact. sclerae normal.  Ears:  normal TMs  and canal and external inspection of ears show no abnormality.  Nose:  Nasal congestion, clear rhinorrhea   Mouth: normal.  Neck:  Neck supple. No adenopathy, thyroid symmetric, normal size.  Heart:  normal rate and regular rhythm, no murmurs, clicks, or gallops.  Lungs: clear to auscultation..  Extremities:  no cyanosis, clubbing, or edema and distal pulses normal.  Skin:  Cyst left medial canthus, does have a head to it. .        ASSESSMENT:  (272.4) Hyperlipidemia LDL goal < 130  (primary encounter diagnosis)  Comment:   Plan: LIPID PANEL, HEPATIC FUNCTION PANEL,         Atorvastatin (LIPITOR) 40 mg tablet        Low fat diet, change back to lipitor worked better    (790.21) Impaired fasting glucose  Comment:   Plan: GLUCOSE FASTING BLOOD        Low sugar diet, limit carbohydrates, advised nutritionist appointment, she declined    (706.2) Cyst of skin  Comment:   Plan:. INCISION & DRAINAGE:  Verbal informed consent was obtained after incision and drainage was explained. Risks include pain, bleeding,  skin infection, scarring, failure to resolve the condition and/or other unforeseen complications. Marisa Jenkins indicates she understands and agrees to proceed. The location of the lesion was verified by the patient. The site was prepped with chlorhexidine scrub. Adequate anesthesia was obtained. The lesion was incised with a #18 needle. white material was expressed. A culture was not obtained.  The procedure was tolerated well. There were no complications. Postprocedural care was discussed. Signs and symptoms of wound infection were discussed.     (477.9) Allergic rhinitis  Comment:   Plan: Olopatadine (PATANOL) 0.1 % Ophthalmic Solution            (372.05) Allergic rhinoconjunctivitis, bilateral  Comment:   Plan: Olopatadine (PATANOL) 0.1 % Ophthalmic Solution        Nasal saline spray three times daily     Advised to call back if symptoms fail to improve as expected.     Total visit time: 25 minutes, of which  10 %  was spent face-to-face counseling.   I reviewed the patient's past medical and family/social history, and updated problem list and past medical history .  Barriers to Learning assessed: none. Patient verbalizes understanding of teaching and instructions.  Marisa Heron, MD  Family Practice, [facility]  [facility]     This document has been created using Dragon Naturally Speaking and has been checked for content. Some voice recognition errors may have been missed inadvertantly.

## 2013-04-20 ENCOUNTER — Ambulatory Visit: Payer: Commercial Managed Care - HMO

## 2013-04-20 DIAGNOSIS — J301 Allergic rhinitis due to pollen: Secondary | ICD-10-CM

## 2013-04-20 NOTE — Nursing Note (Signed)
>>   Suzan Slick, RN     Wed Apr 20, 2013  4:53 PM    After 30 minutes observation, Patient's injection sites were checked for any local reaction. Patient then left the clinic feeling fine.  Local reaction was 0 (wheal/erythema).  Suzan Slick, RN    >> Suzan Slick, RN     Wed Apr 20, 2013  4:28 PM  Patient here for Immunotherapy.  Patient and antigen were identified with 2 identifiers, including patient's confirmation of correct antigen bottle(s).  Patient's condition was then assessed, which included any immediate or delayed reaction to previous dose, current health status, Peak flow reading if indicated, use of any new meds such as antibiotic or Beta Blocker, if antihistamine had been taken within the previous 24 hours, and length of time since last allergy injection(s).    The allergy injection(s) were then administered per the build up schedule.  Patients dose was repeated.(reduced, repeated, advanced).   Patient received 0.50 of 1:1 Aq Tree/Grass/Weed .( dose, concentration, name of antigen).  Suzan Slick, RN

## 2013-05-13 ENCOUNTER — Ambulatory Visit: Payer: Commercial Managed Care - HMO

## 2013-05-13 ENCOUNTER — Other Ambulatory Visit: Payer: Self-pay | Admitting: Rheumatology

## 2013-05-13 DIAGNOSIS — J301 Allergic rhinitis due to pollen: Secondary | ICD-10-CM

## 2013-05-13 NOTE — Progress Notes (Signed)
Antigen expires 06/08/13.  Will need remix.  Suzan Slick, RN

## 2013-05-13 NOTE — Nursing Note (Signed)
>>   Suzan Slick, RN     Ludwig Clarks May 13, 2013 10:47 AM    After 30 minutes observation, Patient's injection sites were checked for any local reaction. Patient then left the clinic feeling fine.  Local reaction was 3/17 (wheal/erythema).  Suzan Slick, RN    >> Suzan Slick, RN     Fri May 13, 2013 10:22 AM  Patient here for Immunotherapy.  Patient and antigen were identified with 2 identifiers, including patient's confirmation of correct antigen bottle(s).  Patient's condition was then assessed, which included any immediate or delayed reaction to previous dose, current health status, Peak flow reading if indicated, use of any new meds such as antibiotic or Beta Blocker, if antihistamine had been taken within the previous 24 hours, and length of time since last allergy injection(s).    The allergy injection(s) were then administered per the build up schedule.  Patients dose was repeated.(reduced, repeated, advanced).   Patient received 0.50 of 1:1 Aq Tree/Grass/Weed   .( dose, concentration, name of antigen).  Suzan Slick, RN

## 2013-05-17 ENCOUNTER — Ambulatory Visit: Payer: Commercial Managed Care - HMO | Admitting: Rheumatology

## 2013-05-17 VITALS — BP 131/80 | HR 81 | Wt 146.1 lb

## 2013-05-17 DIAGNOSIS — J301 Allergic rhinitis due to pollen: Secondary | ICD-10-CM

## 2013-05-17 DIAGNOSIS — J309 Allergic rhinitis, unspecified: Secondary | ICD-10-CM

## 2013-05-17 NOTE — Nursing Note (Signed)
>>   Suzan Slick, RN     Tue May 17, 2013  4:18 PM  Vital signs taken, allergies verified, pain screened and documented.  Almyra Free Wineinger R.N.

## 2013-05-17 NOTE — Progress Notes (Signed)
Marisa Jenkins is a 58yr old female w/allergic rhinitis    Chief Complaint:  F/u allergies    HPI:   05/27/12:  35 antigen prick skin test was positive to grasses, [place], early/late trees, weeds, cats, dog, feathers.  Immunotherapy x1 year.  Still uses cetirizine daily and flonase once/month.  Feels sx well controlled.  Doesn't notice anything that flares her sx.  Typically will get itchy skin, runny nose, itchy eyes.    Medications:  Medication reconciliation was performed today.   Outpatient Prescriptions Marked as Taking for the 05/17/13 encounter (Office Visit) with Shanon Brow, MD   Medication Sig Dispense Refill    Atorvastatin (LIPITOR) 40 mg tablet Take 1 tablet by mouth every day.  90 tablet  3    Butalbital-Aspirin-Caffeine (FIORINAL) 50-325-40 mg per capsule Take 1 capsule by mouth 4 times daily if needed for pain or headache. May repeat dose in 4 hours if response not obtained (not to exceed 6 caps per day)  30 capsule  1    Cetirizine (ZYRTEC) 10 mg Tablet Take 1 tablet by mouth once daily if needed for Allergies.  30 tablet  5    Diazepam (VALIUM) 5 mg Tablet Take 1 tablet by mouth every 6 hours if needed for anxiety.  30 tablet  1    Estradiol-Norethindrone Acet (COMBIPATCH) 0.05-0.25 mg/24 hr Semiweekly Patch Apply 1 patch to clean dry area of skin twice weekly.  24 patch  1    Fluticasone (FLONASE) 50 mcg/actuation nasal spray Instill 1 spray into EACH nostril every day.  16 g  3      PMH:  Past medical history was reviewed from problem list.     PE:  BP 131/80   Pulse 81   Wt 66.271 kg (146 lb 1.6 oz)   LMP 10/28/2010    Conjunctiva wnl, non-injected  Pharynx clear  Skin: no rashes    Labs/Tests: none    Assessment/Plan  Allergic Rhinitis  Doing well on immunotherapy - completed 1 year of tx thus far.  Will continue maintenance therapy which is working well for her. Likely 3-5 yrs of therapy.    EDUCATION:  I educated/instructed the patient or caregiver regarding all aspects of the above  stated plan of care.  The patient or caregiver indicated understanding.      [place] interpreter was not used. Patient seen with Dr. Karis Juba.    Bernerd Pho, PGY 3  Internal Medicine  PI#: [phone]  Pager #: [phone]

## 2013-05-17 NOTE — Addendum Note (Signed)
Addended byShanon Brow on: 05/17/2013 05:07 PM     Modules accepted: Level of Service

## 2013-05-17 NOTE — Progress Notes (Signed)
The patient was seen and examined with Dr. Gigi Gin, resident physician.  The case was discussed and I agree with her note and mutually derived plan.  The patient is improved with immunotherapy and wishes to continue her current schedule, declining an increase in potency of the immunotherapy.

## 2013-05-24 ENCOUNTER — Ambulatory Visit: Payer: Commercial Managed Care - HMO

## 2013-05-24 DIAGNOSIS — J301 Allergic rhinitis due to pollen: Secondary | ICD-10-CM

## 2013-05-24 NOTE — Nursing Note (Signed)
>>   Suzan Slick, RN     Tue May 24, 2013  2:43 PM  20 doses Aq Tree/Grass/Weed remixed.  Suzan Slick, RN

## 2013-05-26 ENCOUNTER — Ambulatory Visit: Payer: Commercial Managed Care - HMO

## 2013-05-26 DIAGNOSIS — J301 Allergic rhinitis due to pollen: Secondary | ICD-10-CM

## 2013-05-26 NOTE — Nursing Note (Signed)
>>   Suzan Slick, RN     Thu May 26, 2013 10:53 AM    After 30 minutes observation, Patient's injection sites were checked for any local reaction. Patient then left the clinic feeling fine.  Local reaction was 5/38 (wheal/erythema).  Suzan Slick, RN    >> Suzan Slick, RN     Thu May 26, 2013 10:34 AM  Patient here for Immunotherapy.  Patient and antigen were identified with 2 identifiers, including patient's confirmation of correct antigen bottle(s).  Patient's condition was then assessed, which included any immediate or delayed reaction to previous dose, current health status, Peak flow reading if indicated, use of any new meds such as antibiotic or Beta Blocker, if antihistamine had been taken within the previous 24 hours, and length of time since last allergy injection(s).    The allergy injection(s) were then administered per the build up schedule.  Patients dose was Advanced.(reduced, repeated, advanced).   Patient received 0.50 of 1:1 Aq Tree/Grass/Weed .( dose, concentration, name of antigen).  Suzan Slick, RN

## 2013-06-02 ENCOUNTER — Other Ambulatory Visit: Payer: Self-pay

## 2013-06-02 ENCOUNTER — Other Ambulatory Visit: Payer: Self-pay | Admitting: Family Medicine

## 2013-06-02 DIAGNOSIS — G43909 Migraine, unspecified, not intractable, without status migrainosus: Principal | ICD-10-CM

## 2013-06-02 MED ORDER — BUTALBITAL-ASPIRIN-CAFFEINE 50 MG-325 MG-40 MG CAPSULE
1.0000 | ORAL_CAPSULE | Freq: Four times a day (QID) | ORAL | 1 refills | Status: DC | PRN
  Filled 2013-06-02: qty 30, 8d supply, fill #0
  Filled 2013-08-17: qty 30, 8d supply, fill #1

## 2013-06-02 NOTE — Telephone Encounter (Signed)
Prescription faxed. Aretta Stetzel, MA1

## 2013-06-02 NOTE — Telephone Encounter (Signed)
Prescription printed, signed and ready to fax to pharmacy. Aulton Routt, MD

## 2013-06-06 ENCOUNTER — Ambulatory Visit: Payer: Commercial Managed Care - HMO

## 2013-06-06 DIAGNOSIS — J301 Allergic rhinitis due to pollen: Secondary | ICD-10-CM

## 2013-06-06 NOTE — Nursing Note (Signed)
>>   Suzan Slick, RN     Mon Jun 06, 2013  5:07 PM    After 30 minutes observation, Patient's injection sites were checked for any local reaction. Patient then left the clinic feeling fine.  Local reaction was 0/35 with a lump. (wheal/erythema).  Suzan Slick, RN    >> Suzan Slick, RN     Mon Jun 06, 2013  4:45 PM  Patient here for Immunotherapy.  Patient and antigen were identified with 2 identifiers, including patient's confirmation of correct antigen bottle(s).  Patient's condition was then assessed, which included any immediate or delayed reaction to previous dose, current health status, Peak flow reading if indicated, use of any new meds such as antibiotic or Beta Blocker, if antihistamine had been taken within the previous 24 hours, and length of time since last allergy injection(s).    The allergy injection(s) were then administered per the build up schedule.  Patients dose was repeated.(reduced, repeated, advanced).   Patient received 0.50 of 1:1 Aq Tree/Grass/Weed   .( dose, concentration, name of antigen).  Suzan Slick, RN      \

## 2013-06-21 ENCOUNTER — Ambulatory Visit: Payer: Commercial Managed Care - HMO

## 2013-06-21 DIAGNOSIS — J301 Allergic rhinitis due to pollen: Secondary | ICD-10-CM

## 2013-06-21 NOTE — Nursing Note (Signed)
>>   Suzan Slick, RN     Tue Jun 21, 2013  2:41 PM  Patient here for Immunotherapy.  Patient and antigen were identified with 2 identifiers, including patient's confirmation of correct antigen bottle(s).  Patient's condition was then assessed, which included any immediate or delayed reaction to previous dose, current health status, Peak flow reading if indicated, use of any new meds such as antibiotic or Beta Blocker, if antihistamine had been taken within the previous 24 hours, and length of time since last allergy injection(s).    The allergy injection(s) were then administered per the build up schedule.  Patients dose was reduced due to remix.(reduced, repeated, advanced).   Patient received 0.05 of 1:1 Aq Tree/Grass/Weed .( dose, concentration, name of antigen).  Suzan Slick, RN      After 30 minutes observation, Patient's injection sites were checked for any local reaction. Patient then left the clinic feeling fine.  Local reaction was 3/4 (wheal/erythema).  Suzan Slick, RN

## 2013-06-28 ENCOUNTER — Ambulatory Visit: Payer: Commercial Managed Care - HMO | Attending: Family | Admitting: Family

## 2013-06-28 ENCOUNTER — Ambulatory Visit (INDEPENDENT_AMBULATORY_CARE_PROVIDER_SITE_OTHER): Payer: Commercial Managed Care - HMO

## 2013-06-28 ENCOUNTER — Encounter: Payer: Self-pay | Admitting: Family

## 2013-06-28 VITALS — BP 118/82 | HR 80 | Temp 98.6°F | Resp 12 | Wt 145.0 lb

## 2013-06-28 DIAGNOSIS — E559 Vitamin D deficiency, unspecified: Secondary | ICD-10-CM

## 2013-06-28 DIAGNOSIS — R933 Abnormal findings on diagnostic imaging of other parts of digestive tract: Secondary | ICD-10-CM

## 2013-06-28 DIAGNOSIS — H8309 Labyrinthitis, unspecified ear: Principal | ICD-10-CM

## 2013-06-28 DIAGNOSIS — D369 Benign neoplasm, unspecified site: Secondary | ICD-10-CM

## 2013-06-28 LAB — CBC WITH DIFFERENTIAL
BASOPHILS % AUTO: 0.5 %
BASOPHILS ABS AUTO: 0 10*3/uL (ref 0–0.2)
EOSINOPHIL % AUTO: 1.8 %
EOSINOPHIL ABS AUTO: 0.1 10*3/uL (ref 0–0.5)
HEMATOCRIT: 43.4 % (ref 36–46)
HEMOGLOBIN: 14.2 g/dL (ref 12.0–16.0)
LYMPHOCYTE ABS AUTO: 2.3 10*3/uL (ref 1.0–4.8)
LYMPHOCYTES % AUTO: 31.8 %
MCH: 32.6 pg (ref 27–33)
MCHC: 32.8 % (ref 32–36)
MCV: 99.3 UM3 (ref 80–100)
MONOCYTES % AUTO: 6.4 %
MONOCYTES ABS AUTO: 0.5 10*3/uL (ref 0.1–0.8)
MPV: 8.6 UM3 (ref 6.8–10.0)
NEUTROPHIL ABS AUTO: 4.3 10*3/uL (ref 1.80–7.70)
NEUTROPHILS % AUTO: 59.5 %
PLATELET COUNT: 236 10*3/uL (ref 130–400)
RDW: 12.3 U (ref 0–14.7)
RED CELL COUNT: 4.37 10*6/uL (ref 4.0–5.2)
WHITE BLOOD CELL COUNT: 7.3 10*3/uL (ref 4.5–11.0)

## 2013-06-28 LAB — VITAMIN D, 25 HYDROXY: VITAMIN D, 25 HYDROXY: 12.8 ng/mL — AB (ref 30.0–100.0)

## 2013-06-28 LAB — FERRITIN: FERRITIN: 123 ng/mL (ref 10–291)

## 2013-06-28 LAB — COMPREHENSIVE METABOLIC PANEL
ALANINE TRANSFERASE (ALT): 36 U/L (ref 5–54)
ALBUMIN: 4.6 g/dL (ref 3.4–4.8)
ALKALINE PHOSPHATASE (ALP): 84 U/L (ref 35–115)
ASPARTATE TRANSAMINASE (AST): 25 U/L (ref 15–43)
BILIRUBIN TOTAL: 0.3 mg/dL (ref 0.3–1.3)
CALCIUM: 9.6 mg/dL (ref 8.6–10.5)
CARBON DIOXIDE TOTAL: 27 meq/L (ref 24–32)
CHLORIDE: 100 meq/L (ref 95–110)
CREATININE BLOOD: 0.55 mg/dL (ref 0.44–1.27)
GLUCOSE: 105 mg/dL — AB (ref 70–99)
POTASSIUM: 4.2 meq/L (ref 3.3–5.0)
PROTEIN: 7 g/dL (ref 6.3–8.3)
SODIUM: 136 meq/L (ref 135–145)
UREA NITROGEN, BLOOD (BUN): 12 mg/dL (ref 8–22)

## 2013-06-28 LAB — TSH WITH FREE T4 REFLEX: THYROID STIMULATING HORMONE: 1.72 u[IU]/mL (ref 0.35–3.30)

## 2013-06-28 NOTE — Nursing Note (Signed)
>>   Marisa Jenkins     Tue Jun 28, 2013  1:36 PM  vital signs taken, allergies verified,screened for pain,and medication history taken.    Sande Rives, [facility]

## 2013-06-28 NOTE — Progress Notes (Signed)
Subjective:   Vertigo onset this am. Spinning sensation.   Not faint.   No recent uri or head trauma.    Ent: is getting allergy shots for allergies. No sinus tenderness or upper tooth pain . No sore throat, headache or neck pain.  Hearing normal. No tinnitus     ROS:  Pulm: no chest cough or congestion   Gu: no spotting or cramping. Post menopausal  Gi: No nausea, vomiting, diarrhea or abdominal pain  Neuro: The patient denies any symptoms of neurological impairment or TIA's; no amaurosis, diplopia, dysphasia, or unilateral disturbance of motor or sensory function.     Objective:  Ga: in no apparent distress. BP 118/82   Pulse 80   Temp(Src) 37 C (98.6 F) (Oral)   Resp 12   Wt 65.772 kg (145 lb)   LMP 10/28/2010  Eyes: conjunctiva clear.   Ears: tms and canals normal. Throat and pharynx normal. Neck supple. No adenopathy or masses in the neck or supraclavicular regions. Thyroid normal. Sinuses non tender.  Chest is clear, no wheezing or rales. Normal symmetric air entry throughout both lung fields. No chest wall deformities or tenderness.  S1 and S2 normal, no murmurs, clicks, gallops or rubs. Regular rate and rhythm.  skin: normal color and temperature.  no rashes. mucous membranes moist.  neuro: CN 11-X11 normal. romberg negative. heel to toe walk normal. heel to shin normal.   normal strength and sensation.  Visual fields normal  Ram normal  Finger to nose normal  dtr patellar, brachioradialis, achilles 2 + and equal.   Lab Results   Lab Name Value Date/Time    HGBA1C 5.4 03/02/2013  9:20 AM    LDLC 153* 03/02/2013  9:20 AM    LDLC 132* 05/12/2012  8:04 AM    CR 0.50 03/02/2013  9:20 AM    CR 0.56 05/12/2012  8:04 AM     Lab Results   Lab Name Value Date/Time    WBC 6.0 05/12/2012  8:04 AM    HGB 13.8 05/12/2012  8:04 AM    HCT 40.9 05/12/2012  8:04 AM    PLT 249 05/12/2012  8:04 AM     05/2011 colonoscopy polyp pathology:   Copied from care everywhere  MICROSCOPIC DIAGNOSIS:  A. SIGMOID COLON, POLYPECTOMY (2  FRAGMENTS):  BENIGN SERRATED POLYP, FAVOR BENIGN HYPERPLASTIC POLYP.     B. DISTAL SIGMOID COLON, POLYPECTOMY:  1. BENIGN TUBULAR ADENOMA.  2. RIMMED BY BENIGN COLONIC MUCOSA IN PLANE OF SECTION.    C. SIGMOID COLON, POLYPECTOMY (2 FRAGMENTS):  TWO FRAGMENTS OF BENIGN TUBULAR ADENOMA WITH MODERATE SURFACE ATYPIA,  INFLAMED.    NOTE:  Level sections are examined on block C.    MEK:ml; 305 x3; 211.3  The slide(s) for this case were examined by the undersigned pathologist  at:  [facility], [address] [place], [place] [address]      SPECIMEN(S): A POLYP SIGMOID  SPECIMEN(S): B POLYP DISTAL SIGMOID  SPECIMEN(S): C POLYP SIGMOID  CLINICAL WNUUVOZ:[id number]    Diagnostician:Little Ishikawa M.D.  Pathologist  Electronically Signed 06/05/2011      Assessment/Plan:  (386.30) Labyrinthitis  (primary encounter diagnosis)  Comment:   Plan: COMPREHENSIVE METABOLIC PANEL, TSH WITH FREE T4        REFLEX, FERRITIN, CBC WITH DIFFERENTIAL,         VITAMIN D, 25 HYDROXY        No work or driving or high risk activites until symptoms resolved. Off work  until Monday.   Will check labs to make sure no contributing factors.       (229.9) Tubular adenoma  Comment:   Plan: GI LAB REFERRAL            (793.4) Abnormal colonoscopy: 3/ 2013  Comment: tubular adenoma with atypia and tubular adenoma on pathology. See chart note above   Plan: GI LAB REFERRAL              Plan: see patient instructions also.  Advised to call back directly if there are further questions, or if these symptoms fail to improve as anticipated or worsen.  I did review patient's medical history.  Barriers to Learning assessed: none. Patient verbalizes understanding of teaching and instructions.    Electronically Signed By:  Darnelle Bos, [place]   Family Nurse Practitioner  PI# [phone]

## 2013-06-28 NOTE — Patient Instructions (Signed)
Rest and fluids.

## 2013-06-29 ENCOUNTER — Encounter: Payer: Self-pay | Admitting: Family

## 2013-06-29 ENCOUNTER — Other Ambulatory Visit: Payer: Self-pay

## 2013-06-29 DIAGNOSIS — E559 Vitamin D deficiency, unspecified: Principal | ICD-10-CM

## 2013-06-29 MED ORDER — ERGOCALCIFEROL (VITAMIN D2) 1,250 MCG (50,000 UNIT) CAPSULE
50000.0000 [IU] | ORAL_CAPSULE | ORAL | Status: AC
Start: 2013-06-29 — End: 2013-08-29

## 2013-06-29 MED ORDER — ERGOCALCIFEROL (VITAMIN D2) 1,250 MCG (50,000 UNIT) CAPSULE
50000.0000 [IU] | ORAL_CAPSULE | ORAL | 0 refills | Status: DC
Start: 2013-06-29 — End: 2013-06-29
  Filled 2013-06-29: qty 8, 56d supply, fill #0

## 2013-06-29 NOTE — Addendum Note (Signed)
Addended by: Aurea Graff on: 06/29/2013 10:38 AM      Modules accepted: Orders

## 2013-06-29 NOTE — Progress Notes (Signed)
Low vitamin D  All other labs normal.   50,000 unit tabs order to take once a week for 8 weeks and then 1000 units over the counter daily  Aurea Graff, NP

## 2013-06-29 NOTE — Addendum Note (Signed)
Addended by: Aurea Graff on: 06/29/2013 09:16 AM      Modules accepted: Orders

## 2013-06-29 NOTE — Progress Notes (Signed)
Message from GI :   The patient is due for colonoscopy in 05/2014. Please refer back around that time.       Thanks,     Assunta Gambles, NP     Division of Gastroenterology          Patient informed by email  Aurea Graff, NP

## 2013-07-01 ENCOUNTER — Ambulatory Visit: Payer: Commercial Managed Care - HMO

## 2013-07-01 DIAGNOSIS — J301 Allergic rhinitis due to pollen: Secondary | ICD-10-CM

## 2013-07-01 NOTE — Nursing Note (Signed)
>>   Orpah Melter, RN     Fri Jul 01, 2013  3:22 PM  After 30 minutes observation, Patient's injection sites were checked for any local reaction, and patient then left the clinic feeling fine.  Local reaction was 3/13.      Injection tolerated well denies itching or other symptoms.   Orpah Melter, RN    >> Orpah Melter, RN     Fri Jul 01, 2013  2:21 PM  Patient here for Immunotherapy.  Patient and antigen were identified with 2 identifiers, including patient's confirmation of correct antigen bottle(s).  Patient's condition was then assessed, which included any immediate or delayed reaction to previous dose, current health status, Peak flow reading if indicated, use of any new meds such as antibiotic or Beta Blocker, if antihistamine had been taken within the previous 24 hours, and length of time since last allergy injection(s).      The allergy injection(s) were then administered per the build up schedule.  Patients dose was  increased.  (reduced, repeated, advanced).    Patient received in  Right arm: Aqueous Trees/Grass/Weeds  1:1 0.10   mL.    Orpah Melter, RN

## 2013-07-08 ENCOUNTER — Other Ambulatory Visit: Payer: Self-pay

## 2013-07-11 ENCOUNTER — Other Ambulatory Visit: Payer: Self-pay

## 2013-07-12 ENCOUNTER — Other Ambulatory Visit: Payer: Self-pay

## 2013-07-15 ENCOUNTER — Ambulatory Visit: Payer: Commercial Managed Care - HMO

## 2013-07-15 DIAGNOSIS — J301 Allergic rhinitis due to pollen: Secondary | ICD-10-CM

## 2013-07-15 NOTE — Nursing Note (Signed)
>>   Suzan Slick, RN     Fri Jul 15, 2013  8:35 AM    After 30 minutes observation, Patient's injection sites were checked for any local reaction. Patient then left the clinic feeling fine.  Local reaction was 0 (wheal/erythema).  Suzan Slick, RN    >> Suzan Slick, RN     Fri Jul 15, 2013  8:18 AM  Patient here for Immunotherapy.  Patient and antigen were identified with 2 identifiers, including patient's confirmation of correct antigen bottle(s).  Patient's condition was then assessed, which included any immediate or delayed reaction to previous dose, current health status, Peak flow reading if indicated, use of any new meds such as antibiotic or Beta Blocker, if antihistamine had been taken within the previous 24 hours, and length of time since last allergy injection(s).    The allergy injection(s) were then administered per the build up schedule.  Patients dose was advanced.(reduced, repeated, advanced).   Patient received 0.20 of 1:1 Aq Tree/Grass/Weed .( dose, concentration, name of antigen).  Suzan Slick, RN

## 2013-08-02 ENCOUNTER — Ambulatory Visit: Payer: Commercial Managed Care - HMO

## 2013-08-02 DIAGNOSIS — J301 Allergic rhinitis due to pollen: Secondary | ICD-10-CM

## 2013-08-02 NOTE — Nursing Note (Signed)
>>   Suzan Slick, RN     Tue Aug 02, 2013 11:07 AM  Patient here for Immunotherapy.  Patient and antigen were identified with 2 identifiers, including patient's confirmation of correct antigen bottle(s).  Patient's condition was then assessed, which included any immediate or delayed reaction to previous dose, current health status, Peak flow reading if indicated, use of any new meds such as antibiotic or Beta Blocker, if antihistamine had been taken within the previous 24 hours, and length of time since last allergy injection(s).    The allergy injection(s) were then administered per the build up schedule.  Patients dose was repeated.(reduced, repeated, advanced).   Patient received 0.30 of 1:1 Aq Tree/Grass/Weed .( dose, concentration, name of antigen).  Suzan Slick, RN      After 30 minutes observation, Patient's injection sites were checked for any local reaction. Patient then left the clinic feeling fine.  Local reaction was 0 (wheal/erythema).  Suzan Slick, RN

## 2013-08-16 ENCOUNTER — Ambulatory Visit: Payer: Commercial Managed Care - HMO

## 2013-08-16 DIAGNOSIS — J301 Allergic rhinitis due to pollen: Secondary | ICD-10-CM

## 2013-08-16 NOTE — Nursing Note (Signed)
>>   Suzan Slick, RN     Tue Aug 16, 2013  9:30 AM  Patient here for Immunotherapy.  Patient and antigen were identified with 2 identifiers, including patient's confirmation of correct antigen bottle(s).  Patient's condition was then assessed, which included any immediate or delayed reaction to previous dose, current health status, Peak flow reading if indicated, use of any new meds such as antibiotic or Beta Blocker, if antihistamine had been taken within the previous 24 hours, and length of time since last allergy injection(s).    The allergy injection(s) were then administered per the build up schedule.  Patients dose was advanced.(reduced, repeated, advanced).   Patient received 0.30 of 1:1 Aq Tree/Grass/Weed .( dose, concentration, name of antigen).  Suzan Slick, RN      After 30 minutes observation, Patient's injection sites were checked for any local reaction. Patient then left the clinic feeling fine.  Local reaction was 0 (wheal/erythema).  Suzan Slick, RN

## 2013-08-17 ENCOUNTER — Other Ambulatory Visit: Payer: Self-pay

## 2013-08-30 ENCOUNTER — Ambulatory Visit: Payer: Commercial Managed Care - HMO

## 2013-08-30 DIAGNOSIS — J301 Allergic rhinitis due to pollen: Secondary | ICD-10-CM

## 2013-08-30 NOTE — Nursing Note (Signed)
>>   Orpah Melter, RN     Tue Aug 30, 2013  9:43 AM  After 30 minutes observation, Patient's injection sites were checked for any local reaction, and patient then left the clinic feeling fine.  Local reaction was 5/45.      Injection tolerated well denies itching or other symptoms.   Orpah Melter, RN    >> Orpah Melter, RN     Tue Aug 30, 2013  9:22 AM  Patient here for Immunotherapy.  Patient and antigen were identified with 2 identifiers, including patient's confirmation of correct antigen bottle(s).  Patient's condition was then assessed, which included any immediate or delayed reaction to previous dose, current health status, Peak flow reading if indicated, use of any new meds such as antibiotic or Beta Blocker, if antihistamine had been taken within the previous 24 hours, and length of time since last allergy injection(s).      The allergy injection(s) were then administered per the build up schedule.  Patients dose was  advanced.  (reduced, repeated, advanced).    Patient received in  Right arm: Aqueous Trees/Grass/Weeds 1:1 0.40   mL.

## 2013-09-15 ENCOUNTER — Ambulatory Visit: Payer: Commercial Managed Care - HMO

## 2013-09-15 DIAGNOSIS — J301 Allergic rhinitis due to pollen: Secondary | ICD-10-CM

## 2013-09-15 NOTE — Nursing Note (Signed)
>>   Suzan Slick, RN     Thu Sep 15, 2013  2:29 PM    After 30 minutes observation, Patient's injection sites were checked for any local reaction. Patient then left the clinic feeling fine.  Local reaction was 0 (wheal/erythema).  Suzan Slick, RN    >> Suzan Slick, RN     Thu Sep 15, 2013  2:05 PM  Patient here for Immunotherapy.  Patient and antigen were identified with 2 identifiers, including patient's confirmation of correct antigen bottle(s).  Patient's condition was then assessed, which included any immediate or delayed reaction to previous dose, current health status, Peak flow reading if indicated, use of any new meds such as antibiotic or Beta Blocker, if antihistamine had been taken within the previous 24 hours, and length of time since last allergy injection(s).    The allergy injection(s) were then administered per the build up schedule.  Patients dose was advanced.(reduced, repeated, advanced).   Patient received 0.50 of 1:1 Aq Tree/Grass/Weed   .( dose, concentration, name of antigen).  Suzan Slick, RN

## 2013-10-06 ENCOUNTER — Ambulatory Visit: Payer: Commercial Managed Care - HMO

## 2013-10-06 DIAGNOSIS — J301 Allergic rhinitis due to pollen: Secondary | ICD-10-CM

## 2013-10-06 NOTE — Nursing Note (Signed)
>>   Orpah Melter, RN     Thu Oct 06, 2013 11:33 AM  After 30 minutes observation, Patient's injection sites were checked for any local reaction, and patient then left the clinic feeling fine.  Local reaction was 4/6.      Injection tolerated well denies itching or other symptoms.   Orpah Melter, RN    >> Orpah Melter, RN     Thu Oct 06, 2013 11:18 AM  Patient here for Immunotherapy.  Patient and antigen were identified with 2 identifiers, including patient's confirmation of correct antigen bottle(s).  Patient's condition was then assessed, which included any immediate or delayed reaction to previous dose, current health status, Peak flow reading if indicated, use of any new meds such as antibiotic or Beta Blocker, if antihistamine had been taken within the previous 24 hours, and length of time since last allergy injection(s).      The allergy injection(s) were then administered per the build up schedule.  Patients dose was  repeated.  (reduced, repeated, advanced).    Patient received in  Right arm: Aqueous Trees/Grass/Weeds 1:1 0.50   mL.

## 2013-10-10 ENCOUNTER — Encounter: Payer: Self-pay | Admitting: Family Medicine

## 2013-10-10 DIAGNOSIS — Z79890 Hormone replacement therapy (postmenopausal): Principal | ICD-10-CM

## 2013-10-10 MED ORDER — ESTRADIOL 0.05 MG-NORETHINDRONE 0.25 MG/24 HR SEMIWKLY TRANSDERM PATCH
1.0000 | MEDICATED_PATCH | TRANSDERMAL | Status: DC
Start: 2013-10-10 — End: 2013-10-24

## 2013-10-10 NOTE — Telephone Encounter (Signed)
Last refill: 04/05/2013    Quantity: 24    Refills: 1     Thank you,  Tiana Loft, [place] I

## 2013-10-10 NOTE — Telephone Encounter (Signed)
Refill done, last WWE 04/2012, please schedule well visit.    Last mammogram 03/2013.    Electronically signed by:  Maebelle Munroe. Farris Blash, MD  [facility]  [facility] [place]

## 2013-10-10 NOTE — Telephone Encounter (Signed)
From: [name]  To: [name], MD  Sent: 10/10/2013 8:38 AM PDT  Subject:  MyChart Refill Request    I  do not have the option any longer to refill my Combipatch (estradiol/norethindrone). I only have one left.

## 2013-10-11 ENCOUNTER — Other Ambulatory Visit: Payer: Self-pay

## 2013-10-11 NOTE — Telephone Encounter (Signed)
Left message via voicemail ( voicemail verified). Marisa Jenkins, [place]

## 2013-10-12 ENCOUNTER — Encounter: Payer: Self-pay | Admitting: Family Medicine

## 2013-10-12 DIAGNOSIS — E785 Hyperlipidemia, unspecified: Secondary | ICD-10-CM

## 2013-10-12 DIAGNOSIS — R7301 Impaired fasting glucose: Principal | ICD-10-CM

## 2013-10-12 NOTE — Telephone Encounter (Signed)
From: [name]  To: [name], MD  Sent: 10/11/2013 4:54 PM PDT  Subject:  Preventive Care    I  believe it has been 6 months since I have had my lipid panel tested. Please schedule my lab work.    Thank  you

## 2013-10-24 ENCOUNTER — Encounter: Payer: Self-pay | Admitting: Family Medicine

## 2013-10-24 DIAGNOSIS — Z79890 Hormone replacement therapy (postmenopausal): Principal | ICD-10-CM

## 2013-10-25 ENCOUNTER — Encounter: Payer: Self-pay | Admitting: Family Medicine

## 2013-10-25 ENCOUNTER — Ambulatory Visit: Payer: Commercial Managed Care - HMO

## 2013-10-25 DIAGNOSIS — J301 Allergic rhinitis due to pollen: Secondary | ICD-10-CM

## 2013-10-25 MED ORDER — ESTRADIOL 0.05 MG-NORETHINDRONE 0.25 MG/24 HR SEMIWKLY TRANSDERM PATCH
1.0000 | MEDICATED_PATCH | TRANSDERMAL | 1 refills | Status: DC
Start: 2013-10-25 — End: 2014-04-27
  Filled 2013-10-25: qty 24, 84d supply, fill #0
  Filled 2014-01-03: qty 24, 84d supply, fill #1

## 2013-10-25 NOTE — Telephone Encounter (Signed)
Prescription refill sent.  She has a follow-up appointment already scheduled.  My chart message sent to patient to let her know. [name], MD

## 2013-10-25 NOTE — Nursing Note (Signed)
>>   Orpah Melter, RN     Tue Oct 25, 2013 10:28 AM  Patient here for Immunotherapy.  Patient and antigen were identified with 2 identifiers, including patient's confirmation of correct antigen bottle(s).  Patient's condition was then assessed, which included any immediate or delayed reaction to previous dose, current health status, Peak flow reading if indicated, use of any new meds such as antibiotic or Beta Blocker, if antihistamine had been taken within the previous 24 hours, and length of time since last allergy injection(s).      The allergy injection(s) were then administered per the build up schedule.  Patients dose was  repeated.  (reduced, repeated, advanced).    Patient received in  Left arm: Aqueous Trees/Grass/Weeds 1:1 0.50   mL.       After 30 minutes observation, Patient's injection sites were checked for any local reaction, and patient then left the clinic feeling fine.  Local reaction was 4/4.      Injection tolerated well denies itching or other symptoms.   Orpah Melter, RN

## 2013-10-25 NOTE — Telephone Encounter (Signed)
Message routed to dr. Sharen Counter urgently. Marisa Jenkins, [place] I

## 2013-10-26 ENCOUNTER — Other Ambulatory Visit: Payer: Self-pay

## 2013-10-27 ENCOUNTER — Other Ambulatory Visit: Payer: Self-pay | Admitting: Family Medicine

## 2013-10-27 DIAGNOSIS — G43909 Migraine, unspecified, not intractable, without status migrainosus: Principal | ICD-10-CM

## 2013-10-27 MED ORDER — BUTALBITAL-ASPIRIN-CAFFEINE 50 MG-325 MG-40 MG CAPSULE
1.0000 | ORAL_CAPSULE | Freq: Four times a day (QID) | ORAL | 1 refills | Status: DC | PRN
  Filled 2013-10-28: qty 30, 8d supply, fill #0
  Filled 2014-02-19: qty 30, 8d supply, fill #1

## 2013-10-27 NOTE — Telephone Encounter (Signed)
Prescription printed, signed and ready to fax to pharmacy. Granger Chui, MD

## 2013-10-28 ENCOUNTER — Other Ambulatory Visit: Payer: Self-pay

## 2013-10-28 NOTE — Telephone Encounter (Signed)
Faxed. Marisa Munford, MA I

## 2013-11-01 ENCOUNTER — Ambulatory Visit: Payer: Commercial Managed Care - HMO | Attending: Family Medicine

## 2013-11-01 DIAGNOSIS — E785 Hyperlipidemia, unspecified: Principal | ICD-10-CM | POA: Insufficient documentation

## 2013-11-01 DIAGNOSIS — R7301 Impaired fasting glucose: Secondary | ICD-10-CM | POA: Insufficient documentation

## 2013-11-01 LAB — HEPATIC FUNCTION PANEL
ALANINE TRANSFERASE (ALT): 48 U/L (ref 5–54)
ALBUMIN: 4.7 g/dL (ref 3.4–4.8)
ALKALINE PHOSPHATASE (ALP): 76 U/L (ref 35–115)
ASPARTATE TRANSAMINASE (AST): 37 U/L (ref 15–43)
BILIRUBIN TOTAL: 0.4 mg/dL (ref 0.3–1.3)
PROTEIN: 7.6 g/dL (ref 6.3–8.3)

## 2013-11-01 LAB — LIPID PANEL WITH DLDL REFLEX
CHOLESTEROL: 185 mg/dL (ref 0–200)
HDL CHOLESTEROL: 51 mg/dL (ref >=35)
LDL CHOLESTEROL CALCULATION: 114 mg/dL (ref <130)
NON-HDL CHOLESTEROL: 134 mg/dL (ref 0–150)
TOTAL CHOLESTEROL:HDL RATIO: 3.6 (ref <4.0)
TRIGLYCERIDE: 100 mg/dL (ref 35–160)

## 2013-11-01 LAB — GLUCOSE FASTING BLOOD: GLUCOSE FASTING BLOOD: 112 mg/dL — AB (ref 70–99)

## 2013-11-02 LAB — HEMOGLOBIN A1C
HGB A1C,GLUCOSE EST AVG: 114 mg/dL
HGB A1C: 5.6 % (ref 3.9–5.6)

## 2013-11-03 ENCOUNTER — Encounter: Payer: Self-pay | Admitting: Family Medicine

## 2013-11-03 ENCOUNTER — Ambulatory Visit: Payer: Commercial Managed Care - HMO | Admitting: Family Medicine

## 2013-11-03 VITALS — BP 132/78 | HR 94 | Resp 12 | Ht 64.5 in | Wt 142.9 lb

## 2013-11-03 DIAGNOSIS — H6120 Impacted cerumen, unspecified ear: Secondary | ICD-10-CM

## 2013-11-03 DIAGNOSIS — Z0000 Encounter for general adult medical examination without abnormal findings: Principal | ICD-10-CM

## 2013-11-03 DIAGNOSIS — H6121 Impacted cerumen, right ear: Secondary | ICD-10-CM

## 2013-11-03 DIAGNOSIS — S9030XA Contusion of unspecified foot, initial encounter: Secondary | ICD-10-CM

## 2013-11-03 DIAGNOSIS — R6882 Decreased libido: Secondary | ICD-10-CM

## 2013-11-03 DIAGNOSIS — S99921A Unspecified injury of right foot, initial encounter: Secondary | ICD-10-CM

## 2013-11-03 NOTE — Patient Instructions (Signed)
Your blood test result shows that you have vitamin D deficiency.  I recommend vit D3 2,000 IU one daily, which is available over the counter without a prescription. Vit D is also found in milk and dairy products (about 4 servings per day should bring it to normal if you prefer that over taking Vit D supplement). A follow up nonfasting blood test has been ordered to recheck your Vit D level in 3 months. A low vit D level can cause osteoporosis and may also be a contributing factor to lowered immunity.   Damontre Millea, MD

## 2013-11-03 NOTE — Nursing Note (Signed)
Vital signs taken,tobacco,allergies,pharmacy verified, screened for pain.  Atlee Villers, MA

## 2013-11-04 DIAGNOSIS — S99921A Unspecified injury of right foot, initial encounter: Secondary | ICD-10-CM | POA: Insufficient documentation

## 2013-11-04 DIAGNOSIS — H6120 Impacted cerumen, unspecified ear: Secondary | ICD-10-CM | POA: Insufficient documentation

## 2013-11-04 DIAGNOSIS — R6882 Decreased libido: Secondary | ICD-10-CM | POA: Insufficient documentation

## 2013-11-04 NOTE — Progress Notes (Signed)
11/04/2013     Chief Complaint   Patient presents with    Well Woman (GYN)     last MAmmo 03/21/2013, last PAP 11/21/2011 under care everywhere        SUBJECTIVE: Marisa Jenkins is a 58yr old female who presents today for a routine prevention physical.   Her last physical exam was 1 years ago.   In general she has been feeling well and functioning well at home, work, and personal relationships.   had a recent injury of her right foot, when she was swimming at a swim per area and accidentally hit her foot hard on a board 58.  It did swell and hurt for the past 2 months it is still tender and there is a little ridge on the dorsal aspect of her foot.  Able to walk without difficulty, seems to be gradually resolving.      Also tends to get ear wax buildup.Sometimes affects her hearing, but not today.  Denies any ear pain.    Last mammogram was January 2015, which was normal, and last Pap smear test was done 2013 at [place], and also had HPV test which was normal.  Next Pap smear test due in 2018.  Denies any family history of cervical cancer.  Has not had any bloating or pelvic pain, denies any postmenopausal bleeding.     Nonsmoker, she has 3 children, and they are in good health.  Marriage is going well, denies any violence in the home.  Last colonoscopy was 2013, had a tubular adenoma and advise followup 2016.    Has some urinary difficulty due to to leaking of urine.  History of bladder sling surgery 12 years ago.  Has done well, and over the past year seems like symptoms are starting to recur.  Denies any burning with urination.      OB/GYN HISTORY: Menstrual history :  Patient's last menstrual period was 10/28/2010.       She is performing periodic breast self examinations. She has not noted any breast problems.   Vaccines are up to date     ROS:   Review of system   Constitutional: No fever, chills, night sweats, weight loss.     Eyes: Negative   Ears-Nose-Mouth-Throat: Negative  Cardiovascular: Negative denies  any chest pain  Respiratory: Negative   Gastrointestinal: negative denies any blood in the stools  Genitourinary: stress urinary incontinence with coughing sneezing jumping, not a daily problem  Musculoskeletal: sometimes back pain.   Integument: No rash, or hives.  has not noticed any new suspicious lesions  Neurological system: Negative    Psychiatric: doing well socially  Hematologic/Lymphatic: Negative        PAST MEDICAL HISTORY:   Past Medical History   Diagnosis Date    Hyperlipidemia 04/29/2012    Postmenopausal HRT (hormone replacement therapy) 04/29/2012    Migraine headache 04/29/2012    Allergic rhinitis 04/29/2012    Anxiety disorder 04/29/2012    Sciatica 2006    Impaired fasting glucose     Pain in joint of right hand 2009     thumb joint    Menorrhagia     Internal hemorrhoids 02/27/2006    Thrombocytopenia 06/11/2006      Past Surgical History   Procedure Laterality Date    Tonsillectomy      Pr colsc flx w/rmvl of tumor polyp lesion snare tq  04/22/2011     tubular adenoma: recheck 05/2014    Pr lap  procedure, unlisted, bladder  2003     bladder repair    Pr removal of leg veins/ulcer  02/2003     vein stripping  Dr. Vira Agar    Pr colsc flx w/rmvl of tumor polyp lesion snare tq  05/18/06     tubular adenoma    Tonsillectomy and adenoidectomy  1962    Biopsy breast  2008     benign    Biopsy breast Left 10/2009     benign        ALLERGIES:   Allergies   Allergen Reactions    Clindamycin Other-Reaction in Comments     heartburn    Penicillin G *N/A*        Current outpatient prescriptions: Aspirin 81 mg DR Tablet EC Tablet, Take 1 tablet by mouth every day., Disp: 30 tablet, Rfl: 5;  Atorvastatin (LIPITOR) 40 mg tablet, Take 1 tablet by mouth every day., Disp: 90 tablet, Rfl: 3  Butalbital-Aspirin-Caffeine (FIORINAL) 50-325-40 mg per capsule, Take 1 capsule by mouth 4 times daily if needed for pain or headache. May repeat dose in 4 hours if response not obtained (not to exceed 6 caps  per day), Disp: 30 capsule, Rfl: 1;  Cetirizine (ZYRTEC) 10 mg Tablet, Take 1 tablet by mouth once daily if needed for Allergies., Disp: 30 tablet, Rfl: 5  Diazepam (VALIUM) 5 mg Tablet, Take 1 tablet by mouth every 6 hours if needed for anxiety., Disp: 30 tablet, Rfl: 1;  Estradiol-Norethindrone Acet (COMBIPATCH) 0.05-0.25 mg/24 hr Semiweekly Patch, Apply 1 patch to clean dry area of skin twice weekly., Disp: 24 patch, Rfl: 1   FAMILY HISTORY:   Family History   Problem Relation Age of Onset    Lipids Father      deceased age 80 CHF    Heart Father      coronary artery disease    Heart Brother      coronary artery disease 77s    Cancer Maternal Grandmother      breast, 66    Genetic Father      rheumatoid arthritis    osteoporosis [Other] [OTHER] Paternal Grandmother     motor vehicle accident [Other] [OTHER] Mother      died at 11     reviewed  Immunization History   Administered Date(s) Administered    Influenza Vaccine, Quadrivalent (Fluzone/Fluarix) 01/04/2013    Tdap (Boostrix) 03/11/2013        OBJECTIVE:  BP 132/78 mmHg   Pulse 94   Resp 12   Ht 1.638 m (5' 4.5")   Wt 64.819 kg (142 lb 14.4 oz)   BMI 24.16 kg/m2   SpO2 96%   LMP 10/28/2010   Patient's last menstrual period was 10/28/2010.      General Appearance: healthy, alert, no distress, pleasant affect, cooperative.  Eyes:  conjunctivae and corneas clear. PERRL, EOM's intact. sclerae normal.  Ears:  normal TMs and canal and external inspection of ears show no abnormality, right ear mostly ful of wax..  Nose:  normal.  Mouth: normal.  Neck:  Neck supple. No adenopathy, thyroid symmetric, normal size.  Heart:  normal rate and regular rhythm, no murmurs, clicks, or gallops.  Lungs: clear to auscultation.  Breast:  inspection negative. No nipple discharge, bleeding or masses.  Abdomen: BS normal.  Abdomen soft, non-tender.  No masses or organomegaly.  Extremities:  no cyanosis, clubbing, or edema.  Skin:  Skin color, texture, turgor normal. No  rashes, has several seborrheic keratoses, and cherry  angiomas.  No suspicious skin lesions. does have a small bruise on the dorsal aspect of her right foot which is mildly tender.  Full range of motion of the foot  Pelvic:  External genitalia and vagina normal.  Rectal: hemorrhoidal tag.  Neuro: Gait normal. Reflexes normal and symmetric. Sensation and strength grossly normal.             ASSESSMENT:  (V70.0) Routine general medical examination at a health care facility  (primary encounter diagnosis)  Comment: continue vitamin D supplement, reviewed general health care  sunscreen/skin care measures, regular exercise  Plan: VITAMIN D, 25 HYDROXY, Aspirin 81 mg DR Tablet         EC Tablet            (799.81) Libido, decreased  Comment: she states this has been ongoing problem, worse since menopause.  She is interested in the female Viagra pill after it has been out for several months.  Plan:   Advised to call back after more information is known about the medication.    (959.7) Injury of right foot, initial encounter  Comment:   Plan: suspect bruised badly, and seems to be healing fine in either case.  Supportive shoe    (380.4) Cerumen impaction, right  Comment: she declined any treatment for this today.  Plan: will try over-the-counter Debrox ear drops        Prevention counseling: The patient was counseled regarding routine female prevention issues to include breast self examination, annual mammograms. She was also counseled regarding screening for hyperlipidemia, healthy diet and exercise, routine immunizations, sun protection and routine examinations. A Gynecologic Cancers pamphlet was  provided to the patient. Counselling in regards to timing for colon cancer screening and types of available screening discussed. Self care measures discussed include skin care, dental hygiene and cleanings, seatbelt and helmet use.     Advised annual follow up exam.    I reviewed the patient's past medical and family/social  history, and updated problem list and past medical history and social history.  Barriers to Learning assessed: none. Patient verbalizes understanding of teaching and instructions.  [name], MD  Family Practice, [facility]

## 2013-11-11 ENCOUNTER — Encounter: Payer: Self-pay | Admitting: Family Medicine

## 2013-11-17 ENCOUNTER — Ambulatory Visit: Payer: Commercial Managed Care - HMO

## 2013-11-17 DIAGNOSIS — J301 Allergic rhinitis due to pollen: Secondary | ICD-10-CM

## 2013-11-17 NOTE — Nursing Note (Signed)
Patient here for Immunotherapy.  Patient and antigen were identified with 2 identifiers, including patient's confirmation of correct antigen bottle(s).  Patient's condition was then assessed, which included any immediate or delayed reaction to previous dose, current health status, Peak flow reading if indicated, use of any new meds such as antibiotic or Beta Blocker, if antihistamine had been taken within the previous 24 hours, and length of time since last allergy injection(s).      The allergy injection(s) were then administered per the build up schedule.  Patients dose was  repeated.  (reduced, repeated, advanced).    Patient received in  Right arm: Aqueous Trees/Grass/Weeds 1:1 0.50   mL.    After 30 minutes observation, Patient's injection sites were checked for any local reaction, and patient then left the clinic feeling fine.  Local reaction was 4/14.      Injection tolerated well denies itching or other symptoms.   Orpah Melter, RN

## 2013-12-01 ENCOUNTER — Other Ambulatory Visit: Payer: Self-pay | Admitting: Family Medicine

## 2013-12-01 DIAGNOSIS — F419 Anxiety disorder, unspecified: Principal | ICD-10-CM

## 2013-12-01 MED ORDER — DIAZEPAM 5 MG TABLET
5.0000 mg | ORAL_TABLET | Freq: Four times a day (QID) | ORAL | 1 refills | Status: DC | PRN
  Filled 2013-12-02: qty 30, 8d supply, fill #0
  Filled 2014-05-02: qty 30, 8d supply, fill #1

## 2013-12-01 NOTE — Progress Notes (Signed)
Prescription printed, signed and ready to fax to pharmacy. Inioluwa Boulay, MD

## 2013-12-02 ENCOUNTER — Other Ambulatory Visit: Payer: Self-pay

## 2013-12-02 NOTE — Telephone Encounter (Signed)
Faxed. Deysy Schabel, MA I

## 2013-12-04 ENCOUNTER — Other Ambulatory Visit: Payer: Self-pay

## 2013-12-06 ENCOUNTER — Ambulatory Visit: Payer: Commercial Managed Care - HMO

## 2013-12-06 DIAGNOSIS — J301 Allergic rhinitis due to pollen: Secondary | ICD-10-CM

## 2013-12-06 NOTE — Nursing Note (Signed)
Patient here for Immunotherapy.  Patient and antigen were identified with 2 identifiers, including patient's confirmation of correct antigen bottle(s).  Patient's condition was then assessed, which included any immediate or delayed reaction to previous dose, current health status, Peak flow reading if indicated, use of any new meds such as antibiotic or Beta Blocker, if antihistamine had been taken within the previous 24 hours, and length of time since last allergy injection(s).    The allergy injection(s) were then administered per the build up schedule.  Patients dose was repeated.(reduced, repeated, advanced).   Patient received 0.50 of 1:1 Aq Tree/Grass/Weed .( dose, concentration, name of antigen).  Suzan Slick, RN      After 30 minutes observation, Patient's injection sites were checked for any local reaction. Patient then left the clinic feeling fine.  Local reaction was 3/4 (wheal/erythema).  Suzan Slick, RN

## 2013-12-27 ENCOUNTER — Ambulatory Visit: Payer: Commercial Managed Care - HMO

## 2013-12-28 ENCOUNTER — Ambulatory Visit: Payer: Commercial Managed Care - HMO

## 2013-12-28 DIAGNOSIS — J301 Allergic rhinitis due to pollen: Secondary | ICD-10-CM

## 2013-12-28 NOTE — Nursing Note (Signed)
After 30 minutes observation, Patient's injection sites were checked for any local reaction. Patient then left the clinic feeling fine.  Local reaction was 0 (wheal/erythema).  Tayshaun Kroh C Beckham Capistran, RN

## 2013-12-28 NOTE — Nursing Note (Signed)
The Influenza Vaccine VIS document for the flu injection was given to patient to review. Patient or person named in permission has answered no to any history of egg allergy, previous serious reaction to a influenza vaccine or current illness which would preclude them receiving an immunization. Any questions were referred to the physician. The Influenza Vaccine was then administered per protocol. The patient was observed for immediate reactions to the vaccine per protocol. None were observed.   Simara Rhyner C Gavan Nordby, RN

## 2013-12-28 NOTE — Nursing Note (Signed)
Patient here for Immunotherapy.  Patient and antigen were identified with 2 identifiers, including patient's confirmation of correct antigen bottle(s).  Patient's condition was then assessed, which included any immediate or delayed reaction to previous dose, current health status, Peak flow reading if indicated, use of any new meds such as antibiotic or Beta Blocker, if antihistamine had been taken within the previous 24 hours, and length of time since last allergy injection(s).    The allergy injection(s) were then administered per the build up schedule.  Patients dose was repeated.(reduced, repeated, advanced).   Patient received 0.50 of 1:1 Aq Tree/Grass/Weed .( dose, concentration, name of antigen).  Maisen Klingler C Keria Widrig, RN

## 2013-12-29 ENCOUNTER — Ambulatory Visit: Payer: Commercial Managed Care - HMO

## 2013-12-29 DIAGNOSIS — Z23 Encounter for immunization: Secondary | ICD-10-CM

## 2013-12-29 NOTE — Nursing Note (Deleted)
The Influenza Vaccine VIS document for the flu injection was given to patient to review. Patient or person named in permission has answered no to any history of egg allergy, previous serious reaction to a influenza vaccine or current illness which would preclude them receiving an immunization. Any questions were referred to the physician. The Influenza Vaccine was then administered per protocol. The patient was observed for immediate reactions to the vaccine per protocol. None were observed.   Josie Mesa C Kalaysia Demonbreun, RN

## 2013-12-29 NOTE — Nursing Note (Signed)
PLease disregard info below.  Patient did NOT receive the flu vaccine on 12/28/13.  Suzan Slick, RN

## 2013-12-29 NOTE — Nursing Note (Signed)
Please disregard note below.  Boostrix was administered, not flu vaccine.  Suzan Slick, RN    Immunization VIS documentation(s) were given to pt to review. All questions were answered and the patient consented to the Immunization(s) being given. Patient allergies were reviewed and no contraindications were found. The immunization(s) were given as ordered. The patient was observed for any immediate reactions to the vaccine. None were observed.  Boostrix administered.  Suzan Slick, RN

## 2014-01-04 ENCOUNTER — Other Ambulatory Visit: Payer: Self-pay

## 2014-01-16 ENCOUNTER — Other Ambulatory Visit: Payer: Self-pay

## 2014-01-19 ENCOUNTER — Ambulatory Visit: Payer: Commercial Managed Care - HMO

## 2014-01-19 DIAGNOSIS — J301 Allergic rhinitis due to pollen: Secondary | ICD-10-CM

## 2014-01-19 NOTE — Nursing Note (Signed)
Patient here for Immunotherapy.  Patient and antigen were identified with 2 identifiers, including patient's confirmation of correct antigen bottle(s).  Patient's condition was then assessed, which included any immediate or delayed reaction to previous dose, current health status, Peak flow reading if indicated, use of any new meds such as antibiotic or Beta Blocker, if antihistamine had been taken within the previous 24 hours, and length of time since last allergy injection(s).    The allergy injection(s) were then administered per the build up schedule.  Patients dose was repeated.(reduced, repeated, advanced).   Patient received 0.50 of 1:1 Aq Tree/Grass/Weed .( dose, concentration, name of antigen).  Dain Laseter C Germain Koopmann, RN

## 2014-01-19 NOTE — Nursing Note (Signed)
After 30 minutes observation, Patient's injection sites were checked for any local reaction. Patient then left the clinic feeling fine.  Local reaction was 2/2 (wheal/erythema).  Mikhia Dusek C Alferd Obryant, RN

## 2014-02-09 ENCOUNTER — Ambulatory Visit: Payer: Commercial Managed Care - HMO

## 2014-02-09 DIAGNOSIS — J301 Allergic rhinitis due to pollen: Secondary | ICD-10-CM

## 2014-02-09 NOTE — Nursing Note (Signed)
Patient here for Immunotherapy.  Patient and antigen were identified with 2 identifiers, including patient's confirmation of correct antigen bottle(s).  Patient's condition was then assessed, which included any immediate or delayed reaction to previous dose, current health status, Peak flow reading if indicated, use of any new meds such as antibiotic or Beta Blocker, if antihistamine had been taken within the previous 24 hours, and length of time since last allergy injection(s).    The allergy injection(s) were then administered per the build up schedule.  Patients dose was repeated.(reduced, repeated, advanced).   Patient received 0.50 of 1:1 Aq Tree/Grass/Weed .( dose, concentration, name of antigen).  Avaeh Ewer C Aarohi Redditt, RN

## 2014-02-09 NOTE — Nursing Note (Signed)
After 30 minutes observation, Patient's injection sites were checked for any local reaction. Patient then left the clinic feeling fine.  Local reaction was 3/34 with a small lump. (wheal/erythema).  Suzan Slick, RN

## 2014-02-20 ENCOUNTER — Other Ambulatory Visit: Payer: Self-pay

## 2014-02-23 ENCOUNTER — Ambulatory Visit: Payer: Commercial Managed Care - HMO | Admitting: Family Medicine

## 2014-02-24 ENCOUNTER — Encounter: Payer: Self-pay | Admitting: FAMILY PRACTICE

## 2014-02-24 ENCOUNTER — Ambulatory Visit: Payer: Commercial Managed Care - HMO | Admitting: FAMILY PRACTICE

## 2014-02-24 ENCOUNTER — Other Ambulatory Visit: Payer: Self-pay

## 2014-02-24 VITALS — BP 130/88 | HR 95 | Temp 97.4°F | Resp 12 | Wt 146.0 lb

## 2014-02-24 DIAGNOSIS — G5631 Lesion of radial nerve, right upper limb: Principal | ICD-10-CM

## 2014-02-24 MED ORDER — IBUPROFEN 800 MG TABLET
800.0000 mg | ORAL_TABLET | Freq: Three times a day (TID) | ORAL | 0 refills | Status: AC | PRN
Start: 2014-02-24 — End: 2014-03-06
  Filled 2014-02-24: qty 30, 10d supply, fill #0

## 2014-02-24 MED ORDER — ACETAMINOPHEN 325 MG TABLET
650.0000 mg | ORAL_TABLET | ORAL | 0 refills | Status: AC | PRN
Start: 2014-02-24 — End: 2014-03-06
  Filled 2014-02-24: fill #0

## 2014-02-24 NOTE — Progress Notes (Signed)
SUBJECTIVE:  Marisa Jenkins is a 76yr female who sustained a possible right elbow injury two weeks ago. Mechanism of injury: weight training in the gymn. Immediate symptoms: delayed pain, no deformity was noted by the patient. Symptoms have been gradual, slow since that time. Prior history of related problems: no prior problems with this area in the past.    OBJECTIVE:  Vital signs as noted above.  Appearance: in no apparent distress, well developed and well nourished, alert, oriented times 3 and cooperative.  Elbow exam: No swelling, instability; ligaments intact, FROM all hand, wrist, finger joints. Pain to palpation of the trochlear notch and soft tissue between olecranon and lateral epicondyle, with pain radiating into dorso-radial proximal forearm.  Pain to palpation lateral tricep, limited ROM of shoulder due to tricep pain.  SITS exam negative.    X-ray: not indicated.    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    Acetaminophen (TYLENOL) 325 mg Tablet 650 mg, EVERY 4 HOURS IF NEEDED    Ibuprofen (MOTRIN) 800 mg Tablet 800 mg, EVERY 8 HOURS IF NEEDED    Follow up if not significantly improved in 2-3 weeks.  Pt voiced understanding of same.

## 2014-02-24 NOTE — Nursing Note (Signed)
Vital signs taken,tobacco,allergies,pharmacy verified, screened for pain.  Vasilis Luhman, MA

## 2014-03-14 ENCOUNTER — Ambulatory Visit: Payer: Commercial Managed Care - HMO

## 2014-03-14 DIAGNOSIS — J301 Allergic rhinitis due to pollen: Secondary | ICD-10-CM

## 2014-03-14 DIAGNOSIS — J3089 Other allergic rhinitis: Secondary | ICD-10-CM

## 2014-03-14 NOTE — Nursing Note (Signed)
After 30 minutes observation, Patient's injection sites were checked for any local reaction. Patient then left the clinic feeling fine.  Local reaction was 0 (wheal/erythema).  Ivan Lacher C Dellene Mcgroarty, RN

## 2014-03-14 NOTE — Nursing Note (Signed)
Patient here for Immunotherapy.  Patient and antigen were identified with 2 identifiers, including patient's confirmation of correct antigen bottle(s).  Patient's condition was then assessed, which included any immediate or delayed reaction to previous dose, current health status, Peak flow reading if indicated, use of any new meds such as antibiotic or Beta Blocker, if antihistamine had been taken within the previous 24 hours, and length of time since last allergy injection(s).    The allergy injection(s) were then administered per the build up schedule.  Patients dose was decreased as it has been 5 weeks since last IT.(reduced, repeated, advanced).   Patient received 0.25 of 1:1 Aq Tree/Grass/Weed in the left arm .( dose, concentration, name of antigen).  Suzan Slick, RN

## 2014-03-15 ENCOUNTER — Other Ambulatory Visit: Payer: Self-pay | Admitting: Family Medicine

## 2014-03-16 MED ORDER — ATORVASTATIN 40 MG TABLET
40.0000 mg | ORAL_TABLET | Freq: Every day | ORAL | 3 refills | Status: DC
  Filled 2014-03-16: fill #0
  Filled 2014-03-29: qty 90, 90d supply, fill #0
  Filled 2014-06-30: qty 90, 90d supply, fill #1
  Filled 2014-11-13: qty 90, 90d supply, fill #2
  Filled 2015-02-28: qty 90, 90d supply, fill #3

## 2014-03-21 ENCOUNTER — Encounter: Payer: Self-pay | Admitting: Family Medicine

## 2014-03-21 DIAGNOSIS — Z1239 Encounter for other screening for malignant neoplasm of breast: Principal | ICD-10-CM

## 2014-03-21 NOTE — Telephone Encounter (Signed)
From: [name]  To: [name], MD  Sent: 03/21/2014 9:16 AM PST  Subject: Non-urgent Medical Advice Question    I received a letter from [facility] reminding me that it has been a year since my last mammogram. Should I call and make the appointment or do you need to put in a referral?

## 2014-03-21 NOTE — Telephone Encounter (Signed)
Order pending.    Thank you,  Tiana Loft, [place] I

## 2014-03-23 ENCOUNTER — Ambulatory Visit: Payer: Commercial Managed Care - HMO

## 2014-03-23 DIAGNOSIS — J301 Allergic rhinitis due to pollen: Secondary | ICD-10-CM

## 2014-03-23 NOTE — Nursing Note (Signed)
Patient here for Immunotherapy.  Patient and antigen were identified with 2 identifiers, including patient's confirmation of correct antigen bottle(s).  Patient's condition was then assessed, which included any immediate or delayed reaction to previous dose, current health status, Peak flow reading if indicated, use of any new meds such as antibiotic or Beta Blocker, if antihistamine had been taken within the previous 24 hours, and length of time since last allergy injection(s).    The allergy injection(s) were then administered per the build up schedule.  Patients dose was advanced.(reduced, repeated, advanced).   Patient received 0.35 of 1:1 Aq Tree/Grass/Weed in right arm .( dose, concentration, name of antigen).  Suzan Slick, RN

## 2014-03-23 NOTE — Nursing Note (Signed)
After 30 minutes observation, Patient's injection sites were checked for any local reaction. Patient then left the clinic feeling fine.  Local reaction was 3/16 (wheal/erythema).  Suzan Slick, RN

## 2014-03-29 ENCOUNTER — Other Ambulatory Visit: Payer: Self-pay

## 2014-03-29 ENCOUNTER — Other Ambulatory Visit: Payer: Self-pay | Admitting: Family Medicine

## 2014-03-29 NOTE — Telephone Encounter (Signed)
Requested Prescriptions     Pending Prescriptions Disp Refills    Atorvastatin (LIPITOR) 40 mg tablet 90 tablet 3     Sig: Take 1 tablet by mouth every day.     Patient called and stated she has been out of this medication for 2 weeks due to her cleaning lady misplacing it. She tried finding it but cannot. She would like to pick up as soon as possible. Please assist.    Francesco Sor  [place]

## 2014-03-29 NOTE — Telephone Encounter (Signed)
FYI only.      Spoke to pharmacy, they will get medicine ready for her since she is only few days early. Patient notified.    Message sent to PCP     Thank you,  Tiana Loft, [place] I

## 2014-04-10 ENCOUNTER — Ambulatory Visit: Payer: Commercial Managed Care - HMO | Attending: FAMILY PRACTICE | Admitting: FAMILY PRACTICE

## 2014-04-10 ENCOUNTER — Other Ambulatory Visit: Payer: Self-pay

## 2014-04-10 ENCOUNTER — Encounter: Payer: Self-pay | Admitting: FAMILY PRACTICE

## 2014-04-10 VITALS — BP 136/70 | HR 88 | Temp 97.7°F | Resp 16 | Wt 144.0 lb

## 2014-04-10 DIAGNOSIS — N390 Urinary tract infection, site not specified: Principal | ICD-10-CM | POA: Insufficient documentation

## 2014-04-10 LAB — POC URINALYSIS DIPSTICK NON-AUTOMATED W/O MICROSCOPY
POC BILIRUBIN URINE: NEGATIVE
POC GLUCOSE URINE: NEGATIVE
POC KETONES: NEGATIVE
POC NITRITE URINE: POSITIVE
POC PH URINALYSIS: 6
POC PROTEIN URINE: 30
POC SP GRAVITY: 1.02
POC UROBILINOGEN: 0.2

## 2014-04-10 MED ORDER — NITROFURANTOIN MONOHYDRATE/MACROCRYSTALS 100 MG CAPSULE
1.0000 | ORAL_CAPSULE | Freq: Two times a day (BID) | ORAL | 0 refills | Status: AC
Start: 2014-04-10 — End: 2014-04-15
  Filled 2014-04-10: qty 10, 5d supply, fill #0

## 2014-04-10 NOTE — Nursing Note (Signed)
Vital signs taken, allergies verified, screened for pain, and tobacco history reviewed. Shuntia Exton, MA I

## 2014-04-10 NOTE — Addendum Note (Signed)
Addended by: Helene Shoe L on: 04/10/2014 05:29 PM     Modules accepted: Orders

## 2014-04-10 NOTE — Progress Notes (Signed)
Subjective: Marisa Jenkins is a 59yr old female  who presents for further evaluation of urinary tract infection.  She was in her usual state of health up until the morning of January 30 when she developed pain with voiding, urinary frequency and incomplete emptying.  She had mild low back pain which has resolved.  She had subjective fever which has resolved.  She used Pyridium with some improvement.  She denies nausea vomiting diarrhea or constipation.    I reviewed patient's past medical, family/social history  and no changes were made.     Current Medications (reviewed with patient):  Aspirin 81 mg DR Tablet EC Tablet, Take 1 tablet by mouth every day.  Atorvastatin (LIPITOR) 40 mg tablet, Take 1 tablet by mouth every day.  Butalbital-Aspirin-Caffeine [facility]) 50-325-40 mg per capsule, Take 1 capsule by mouth 4 times daily if needed for pain or headache. May repeat dose in 4 hours if response not obtained (not to exceed 6 caps per day)  Cetirizine (ZYRTEC) 10 mg Tablet, Take 1 tablet by mouth once daily if needed for Allergies.  Diazepam (VALIUM) 5 mg Tablet, Take 1 tablet by mouth every 6 hours if needed for anxiety.  Estradiol-Norethindrone Acet (COMBIPATCH) 0.05-0.25 mg/24 hr Semiweekly Patch, Apply 1 patch to clean dry area of skin twice weekly.    ROS: As documented in the subjective portion of my note.    OBJECTIVE:   BP 136/70 mmHg  Pulse 88  Temp(Src) 36.5 C (97.7 F) (Oral)  Resp 16  Wt 65.318 kg (144 lb)  LMP 10/28/2010  She is awake alert and oriented and does not appear ill.  Sclera anicteric conjunctiva pink  Mucosal membranes moist  Lungs clear  No costovertebral angle tenderness  Abdomen is flat soft nontender    Component      Latest Ref Rng 04/10/2014          11:47 AM   POC COLLECTION METHOD       clean   POC CLARITY       cloudy   POC COLOR       yellow   POC GLUCOSE URINE       n   POC BILIRUBIN URINE       n   POC KETONES       n   POC SP GRAVITY       1.020   POC OCCULT BLOOD URINE        moderate   POC PH URINALYSIS       6.0   POC PROTEIN URINE       30   POC UROBILINOGEN       0.2   POC NITRITE URINE       pos   POC LEUK. ESTERASE       large       ASSESSMENT & PLAN:   (N39.0) UTI (lower urinary tract infection)  (primary encounter diagnosis)  Comment: Uncomplicated  Plan: Send urine for CULTURE URINE, BACTI, URINALYSIS AND CULTURE IF        IND, start Nitrofurantoin, Macrocryst25%, (MACROBID)         100 mg capsule twice daily for 5 days.  Increase fluids.  May use Pyridium as needed for the next 24 hours.  Return to clinic if symptoms worsen or do not improve.             Barriers to Learning assessed: none. Patient verbalizes understanding of teaching and instructions.    Hall Busing  Saretta Dahlem MD    Note: this chart was created in part with Par[employer]. Every effort has been made to correct any errors in the voice-recognition / dictation, but some may have been missed. If there are any questions, please contact the author for clarification and revision if needed.

## 2014-04-10 NOTE — Addendum Note (Signed)
Addended by: Helene Shoe L on: 04/10/2014 05:26 PM     Modules accepted: Orders

## 2014-04-10 NOTE — Addendum Note (Signed)
Addended by: Cinda Quest on: 04/10/2014 05:35 PM     Modules accepted: Orders

## 2014-04-10 NOTE — Patient Instructions (Signed)
Urinary Tract Infection in Women: Care Instructions  Your Care Instructions     A urinary tract infection, or UTI, is a general term for an infection anywhere between the kidneys and the urethra (where urine comes out). Most UTIs are bladder infections. They often cause pain or burning when you urinate.  UTIs are caused by bacteria and can be cured with antibiotics. Be sure to complete your treatment so that the infection goes away.  Follow-up care is a key part of your treatment and safety. Be sure to make and go to all appointments, and call your doctor if you are having problems. It's also a good idea to know your test results and keep a list of the medicines you take.  How can you care for yourself at home?   Take your antibiotics as directed. Do not stop taking them just because you feel better. You need to take the full course of antibiotics.   Drink extra water and other fluids for the next day or two. This may help wash out the bacteria that are causing the infection. (If you have kidney, heart, or liver disease and have to limit fluids, talk with your doctor before you increase your fluid intake.)   Avoid drinks that are carbonated or have caffeine. They can irritate the bladder.   Urinate often. Try to empty your bladder each time.   To relieve pain, take a hot bath or lay a heating pad set on low over your lower belly or genital area. Never go to sleep with a heating pad in place.  To prevent UTIs   Drink plenty of water each day. This helps you urinate often, which clears bacteria from your system. (If you have kidney, heart, or liver disease and have to limit fluids, talk with your doctor before you increase your fluid intake.)   Consider adding cranberry juice to your diet.   Urinate when you need to.   Urinate right after you have sex.   Change sanitary pads often.   Avoid douches, bubble baths, feminine hygiene sprays, and other feminine hygiene products that have deodorants.   After  going to the bathroom, wipe from front to back.  When should you call for help?  Call your doctor now or seek immediate medical care if:   Symptoms such as fever, chills, nausea, or vomiting get worse or appear for the first time.   You have new pain in your back just below your rib cage. This is called flank pain.   There is new blood or pus in your urine.   You have any problems with your antibiotic medicine.  Watch closely for changes in your health, and be sure to contact your doctor if:   You are not getting better after taking an antibiotic for 2 days.   Your symptoms go away but then come back.   Where can you learn more?   Go to https://www.healthwise.net/patiented  Enter K848 in the search box to learn more about "Urinary Tract Infection in Women: Care Instructions."    2006-2015 Healthwise[employer]. Care instructions adapted under license by [facility]. This care instruction is for use with your licensed healthcare professional. If you have questions about a medical condition or this instruction, always ask your healthcare professional. Healthwise, Incorporated disclaims any warranty or liability for your use of this information.  Content Version: 10.6.465758; Current as of: November 16, 2012

## 2014-04-10 NOTE — Nursing Note (Signed)
Urine dipstick was done per the orders of dr Riggle.  Caree Wolpert MA.

## 2014-04-13 ENCOUNTER — Other Ambulatory Visit: Payer: Self-pay | Admitting: FAMILY PRACTICE

## 2014-04-13 ENCOUNTER — Other Ambulatory Visit: Payer: Self-pay

## 2014-04-13 DIAGNOSIS — N390 Urinary tract infection, site not specified: Principal | ICD-10-CM

## 2014-04-13 LAB — CULTURE URINE, BACTI

## 2014-04-13 MED ORDER — CIPROFLOXACIN 250 MG TABLET
250.0000 mg | ORAL_TABLET | Freq: Two times a day (BID) | ORAL | 0 refills | Status: AC
Start: 2014-04-13 — End: 2014-04-18
  Filled 2014-04-13: qty 10, 5d supply, fill #0

## 2014-04-18 ENCOUNTER — Ambulatory Visit: Payer: Commercial Managed Care - HMO

## 2014-04-18 ENCOUNTER — Ambulatory Visit
Admit: 2014-04-18 | Discharge: 2014-04-18 | Disposition: A | Discharge status: Home / Self Care | Payer: Commercial Managed Care - HMO | Source: Ambulatory Visit | Attending: Rheumatology | Admitting: Rheumatology

## 2014-04-18 DIAGNOSIS — Z1231 Encounter for screening mammogram for malignant neoplasm of breast: Secondary | ICD-10-CM

## 2014-04-18 DIAGNOSIS — Z1239 Encounter for other screening for malignant neoplasm of breast: Principal | ICD-10-CM | POA: Insufficient documentation

## 2014-04-18 DIAGNOSIS — J301 Allergic rhinitis due to pollen: Secondary | ICD-10-CM

## 2014-04-18 NOTE — Nursing Note (Signed)
Patient here for Immunotherapy.  Patient and antigen were identified with 2 identifiers, including patient's confirmation of correct antigen bottle(s).  Patient's condition was then assessed, which included any immediate or delayed reaction to previous dose, current health status, Peak flow reading if indicated, use of any new meds such as antibiotic or Beta Blocker, if antihistamine had been taken within the previous 24 hours, and length of time since last allergy injection(s).    The allergy injection(s) were then administered per the build up schedule.  Patients dose was repeated.(reduced, repeated, advanced).   Patient received 0.35 of 1:1 Aq Tree/Grass/Weed in left arm .( dose, concentration, name of antigen).  Suzan Slick, RN      After 30 minutes observation, Patient's injection sites were checked for any local reaction. Patient then left the clinic feeling fine.  Local reaction was 3/8 (wheal/erythema).  Suzan Slick, RN

## 2014-04-20 ENCOUNTER — Other Ambulatory Visit: Payer: Self-pay | Admitting: Rheumatology

## 2014-04-20 DIAGNOSIS — J301 Allergic rhinitis due to pollen: Principal | ICD-10-CM

## 2014-04-20 NOTE — Progress Notes (Signed)
Patients antigen expires in March.  Will need remix soon.  Suzan Slick, RN

## 2014-04-25 ENCOUNTER — Encounter: Payer: Self-pay | Admitting: Rheumatology

## 2014-04-25 ENCOUNTER — Ambulatory Visit: Payer: Commercial Managed Care - HMO | Admitting: Rheumatology

## 2014-04-25 VITALS — BP 133/77 | HR 90 | Temp 98.0°F | Wt 144.3 lb

## 2014-04-25 DIAGNOSIS — J301 Allergic rhinitis due to pollen: Secondary | ICD-10-CM

## 2014-04-25 NOTE — Progress Notes (Signed)
The patient is seen in followup for her allergic rhinitis.  The patient has tolerated immunotherapy with small local reactions and she feels it is efficacious.  She uses cetirizine regularly but no other medication.  She has not had any asthma.    Examination: Per homonculus; notable for clear nares and clear chest.    Assessment/plan: Allergic rhinitis.  The patient is improved with immunotherapy which will be continued.  She will return in one year for followup.  She may use her cetirizine for breakthrough symptoms.

## 2014-04-25 NOTE — Nursing Note (Signed)
Vital signs taken, allergies verified, and screened for pain.    Alvin Diffee, MA

## 2014-04-27 ENCOUNTER — Other Ambulatory Visit: Payer: Self-pay | Admitting: Family Medicine

## 2014-04-27 DIAGNOSIS — Z79890 Hormone replacement therapy (postmenopausal): Principal | ICD-10-CM

## 2014-04-28 MED ORDER — ESTRADIOL 0.05 MG-NORETHINDRONE 0.25 MG/24 HR SEMIWKLY TRANSDERM PATCH
1.0000 | MEDICATED_PATCH | TRANSDERMAL | 2 refills | Status: DC
Start: 2014-04-28 — End: 2014-08-22
  Filled 2014-04-28: qty 24, 30d supply, fill #0
  Filled 2014-08-17: qty 24, 30d supply, fill #1

## 2014-05-01 ENCOUNTER — Ambulatory Visit: Payer: Commercial Managed Care - HMO

## 2014-05-01 DIAGNOSIS — J301 Allergic rhinitis due to pollen: Secondary | ICD-10-CM

## 2014-05-01 NOTE — Nursing Note (Signed)
Remixed Antigen Aqueous Trees/Grass/Weeds 20 doses  Orpah Melter, RN

## 2014-05-02 ENCOUNTER — Other Ambulatory Visit: Payer: Self-pay

## 2014-05-04 ENCOUNTER — Ambulatory Visit: Payer: Commercial Managed Care - HMO

## 2014-05-04 DIAGNOSIS — J301 Allergic rhinitis due to pollen: Secondary | ICD-10-CM

## 2014-05-04 NOTE — Nursing Note (Signed)
Patient here for Immunotherapy.  Patient and antigen were identified with 2 identifiers, including patient's confirmation of correct antigen bottle(s).  Patient's condition was then assessed, which included any immediate or delayed reaction to previous dose, current health status, Peak flow reading if indicated, use of any new meds such as antibiotic or Beta Blocker, if antihistamine had been taken within the previous 24 hours, and length of time since last allergy injection(s).    The allergy injection(s) were then administered per the build up schedule.  Patients dose was advanced.(reduced, repeated, advanced).   Patient received 0.40 of 1:1 Aq Tree/Grass/Weed in right arm .( dose, concentration, name of antigen).  Suzan Slick, RN      After 30 minutes observation, Patient's injection sites were checked for any local reaction. Patient then left the clinic feeling fine.  Local reaction was 4/14 (wheal/erythema).  Suzan Slick, RN

## 2014-05-16 ENCOUNTER — Encounter: Payer: Self-pay | Admitting: Family Medicine

## 2014-05-16 DIAGNOSIS — E785 Hyperlipidemia, unspecified: Principal | ICD-10-CM

## 2014-05-16 DIAGNOSIS — R7301 Impaired fasting glucose: Secondary | ICD-10-CM

## 2014-05-16 NOTE — Telephone Encounter (Signed)
From: [name]  To: [name], MD  Sent: 05/16/2014 10:25 AM PST  Subject: Non-urgent Medical Advice Question    Please order blood work for my 6 month lipid panel screening.

## 2014-05-18 ENCOUNTER — Ambulatory Visit: Payer: Commercial Managed Care - HMO

## 2014-05-23 ENCOUNTER — Ambulatory Visit: Payer: Commercial Managed Care - HMO

## 2014-05-23 DIAGNOSIS — J301 Allergic rhinitis due to pollen: Secondary | ICD-10-CM

## 2014-05-23 NOTE — Nursing Note (Signed)
Patient here for Immunotherapy.  Patient and antigen were identified with 2 identifiers, including patient's confirmation of correct antigen bottle(s).  Patient's condition was then assessed, which included any immediate or delayed reaction to previous dose, current health status, Peak flow reading if indicated, use of any new meds such as antibiotic or Beta Blocker, if antihistamine had been taken within the previous 24 hours, and length of time since last allergy injection(s).    The allergy injection(s) were then administered per the build up schedule.  Patients dose was decreased due to remix.(reduced, repeated, advanced).   Patient received 0.20 of 1:1 Aq Tree/Grass/Weed in left arm .( dose, concentration, name of antigen).  Suzan Slick, RN

## 2014-05-23 NOTE — Nursing Note (Signed)
After 30 minutes observation, Patient's injection sites were checked for any local reaction. Patient then left the clinic feeling fine.  Local reaction was 0 (wheal/erythema).  Crisanto Nied C Micaella Gitto, RN

## 2014-06-05 ENCOUNTER — Ambulatory Visit: Payer: Commercial Managed Care - HMO | Admitting: FAMILY PRACTICE

## 2014-06-05 ENCOUNTER — Other Ambulatory Visit: Payer: Self-pay

## 2014-06-05 VITALS — BP 148/92 | HR 86 | Temp 98.2°F | Resp 14 | Wt 143.3 lb

## 2014-06-05 DIAGNOSIS — J069 Acute upper respiratory infection, unspecified: Principal | ICD-10-CM

## 2014-06-05 DIAGNOSIS — L03031 Cellulitis of right toe: Secondary | ICD-10-CM

## 2014-06-05 MED ORDER — CEPHALEXIN 500 MG CAPSULE
500.0000 mg | ORAL_CAPSULE | Freq: Three times a day (TID) | ORAL | 0 refills | Status: AC
Start: 2014-06-05 — End: 2014-07-06
  Filled 2014-06-05: qty 7, 3d supply, fill #0
  Filled 2014-06-05: qty 23, 7d supply, fill #0

## 2014-06-05 NOTE — Progress Notes (Signed)
4:16 PM       Chief Complaint   Patient presents with    Cough    Toe Problem     right foot ,large toe     Marisa  CHENAE Jenkins is a 41yr female who presents for a new problem of one week history of scratchy throat, cough, post nasal drainage, (fever x 2 days last week), sneezing, and right big toe pain since yesterday.      SOCIAL HISTORY  Married, empty nest    MEDICATIONS AS LISTED BEFORE TODAY'S REVIEW    Current outpatient prescriptions:     Aspirin 81 mg DR Tablet EC Tablet, Take 1 tablet by mouth every day., Disp: 30 tablet, Rfl: 5    Atorvastatin (LIPITOR) 40 mg tablet, Take 1 tablet by mouth every day., Disp: 90 tablet, Rfl: 3    Cetirizine (ZYRTEC) 10 mg Tablet, Take 1 tablet by mouth once daily if needed for Allergies., Disp: 30 tablet, Rfl: 5    Diazepam (VALIUM) 5 mg Tablet, Take 1 tablet by mouth every 6 hours if needed for anxiety., Disp: 30 tablet, Rfl: 1    Estradiol-Norethindrone Acet (COMBIPATCH) 0.05-0.25 mg/24 hr Semiweekly Patch, Apply 1 patch to clean dry area of skin twice weekly., Disp: 24 patch, Rfl: 2    Patient Active Problem List    Diagnosis Date Noted    Cerumen impaction 11/04/2013    Injury of right foot 11/04/2013     Overview Note:     June 2015, bruised top of foot hit it hard against      Libido, decreased 11/04/2013     Overview Note:     interested in decreased libido      Allergic rhinitis due to pollen 05/27/2012    Impaired fasting glucose     Pain in joint of right hand      Overview Note:     thumb joint      Hyperlipidemia 04/29/2012    Postmenopausal HRT (hormone replacement therapy) 04/29/2012    Migraine headache 04/29/2012    Allergic rhinitis 04/29/2012    Anxiety disorder 04/29/2012    Internal hemorrhoids 02/27/2006       OBJECTIVE:        BP 148/92 mmHg  Pulse 86  Temp(Src) 36.8 C (98.2 F) (Oral)  Resp 14  Wt 65 kg (143 lb 4.8 oz)  LMP 10/28/2010      GEN'L  APPEARANCE - middle aged woman in no acute distress  Eyes - PER; normal corneas  and conjunctivae.  EARS - normal canals and TM's bilaterally    THROAT - no erythema of pharynx, no exudates  CHEST - clear to auscultation.   NECK - supple & without adenopathy  NOSE - no discharge   SKIN normal  Right foot - tender, red medial right great toe but no MTP, PIP joint tenderness or pain on motion    (J06.9) URI (upper respiratory infection)  (primary encounter diagnosis)  Comment: viral illness, no antibiotics required     (L03.031) Cellulitis of great toe of right foot  Plan: Cephalexin (KEFLEX) 500 mg Capsule          Barriers to Learning: none.     Patient/Family Understanding: verbalizes.  Maretta Bees, MD    4:26 PM

## 2014-06-05 NOTE — Nursing Note (Signed)
Vital signs taken, allergies verified, screened for pain, med hx taken (if appropriate),   and verified immunization status.  Aubrey Voong D Milta Croson, LVN,

## 2014-06-08 ENCOUNTER — Ambulatory Visit: Payer: Commercial Managed Care - HMO

## 2014-06-08 DIAGNOSIS — J301 Allergic rhinitis due to pollen: Secondary | ICD-10-CM

## 2014-06-08 NOTE — Nursing Note (Signed)
After 30 minutes observation, Patient's injection sites were checked for any local reaction. Patient then left the clinic feeling fine.  Local reaction was 2/32 with a lump (wheal/erythema).  Suzan Slick, RN

## 2014-06-08 NOTE — Nursing Note (Signed)
Patient here for Immunotherapy.  Patient and antigen were identified with 2 identifiers, including patient's confirmation of correct antigen bottle(s).  Patient's condition was then assessed, which included any immediate or delayed reaction to previous dose, current health status, Peak flow reading if indicated, use of any new meds such as antibiotic or Beta Blocker, if antihistamine had been taken within the previous 24 hours, and length of time since last allergy injection(s).    The allergy injection(s) were then administered per the build up schedule.  Patients dose was advanced.(reduced, repeated, advanced).   Patient received 0.30 of 1:1 Aq Tree/Grass/Weed in right arm .( dose, concentration, name of antigen).  Suzan Slick, RN

## 2014-06-15 ENCOUNTER — Encounter: Payer: Self-pay | Admitting: Family Medicine

## 2014-06-15 ENCOUNTER — Ambulatory Visit: Payer: Commercial Managed Care - HMO

## 2014-06-15 DIAGNOSIS — J3089 Other allergic rhinitis: Secondary | ICD-10-CM

## 2014-06-15 MED ORDER — BUTALBITAL-ASPIRIN-CAFFEINE 50 MG-325 MG-40 MG CAPSULE
1.0000 | ORAL_CAPSULE | Freq: Four times a day (QID) | ORAL | 1 refills | Status: DC | PRN
  Filled 2014-06-16: qty 30, 8d supply, fill #0
  Filled 2014-10-19: qty 30, 8d supply, fill #1

## 2014-06-15 NOTE — Telephone Encounter (Signed)
Prescription printed, signed and ready to fax to pharmacy. Sivan Cuello, MD

## 2014-06-15 NOTE — Nursing Note (Signed)
Patient here for Immunotherapy.  Patient and antigen were identified with 2 identifiers, including patient's confirmation of correct antigen bottle(s).  Patient's condition was then assessed, which included any immediate or delayed reaction to previous dose, current health status, Peak flow reading if indicated, use of any new meds such as antibiotic or Beta Blocker, if antihistamine had been taken within the previous 24 hours, and length of time since last allergy injection(s).    The allergy injection(s) were then administered per the build up schedule.  Patients dose was advanced.(reduced, repeated, advanced).   Patient received 0.40 of 1:1 Aq Tree/Grass/Weed in left arm .( dose, concentration, name of antigen).  Suzan Slick, RN      After 30 minutes observation, Patient's injection sites were checked for any local reaction. Patient then left the clinic feeling fine.  Local reaction was 4/20 (wheal/erythema).  Suzan Slick, RN

## 2014-06-15 NOTE — Telephone Encounter (Signed)
From: [name]  To: [name], MD  Sent: 06/15/2014 1:45 PM PDT  Subject: Non-urgent Medical Advice Question    I do not see the request for renewal for my prescription of Fiorinal.

## 2014-06-16 ENCOUNTER — Other Ambulatory Visit: Payer: Self-pay

## 2014-06-16 NOTE — Telephone Encounter (Signed)
Prescription faxed to [place].   Onnie Graham M.A.II

## 2014-06-30 ENCOUNTER — Ambulatory Visit: Payer: Commercial Managed Care - HMO

## 2014-06-30 ENCOUNTER — Other Ambulatory Visit: Payer: Self-pay

## 2014-06-30 DIAGNOSIS — J301 Allergic rhinitis due to pollen: Secondary | ICD-10-CM

## 2014-06-30 NOTE — Nursing Note (Signed)
After 30 minutes observation, Patient's injection sites were checked for any local reaction. Patient then left the clinic feeling fine.  Local reaction was 0 (wheal/erythema).  Alyha Marines C Reona Zendejas, RN

## 2014-06-30 NOTE — Nursing Note (Signed)
Patient here for Immunotherapy.  Patient and antigen were identified with 2 identifiers, including patient's confirmation of correct antigen bottle(s).  Patient's condition was then assessed, which included any immediate or delayed reaction to previous dose, current health status, Peak flow reading if indicated, use of any new meds such as antibiotic or Beta Blocker, if antihistamine had been taken within the previous 24 hours, and length of time since last allergy injection(s).    The allergy injection(s) were then administered per the build up schedule.  Patients dose was advanced.(reduced, repeated, advanced).   Patient received 0.50 of 1:1 Aq Tree/Grass/Weed in right arm .( dose, concentration, name of antigen).  Suzan Slick, RN

## 2014-07-03 ENCOUNTER — Ambulatory Visit: Payer: Commercial Managed Care - HMO | Attending: Family Medicine

## 2014-07-03 DIAGNOSIS — R7301 Impaired fasting glucose: Secondary | ICD-10-CM | POA: Insufficient documentation

## 2014-07-03 DIAGNOSIS — Z0000 Encounter for general adult medical examination without abnormal findings: Principal | ICD-10-CM | POA: Insufficient documentation

## 2014-07-03 DIAGNOSIS — N390 Urinary tract infection, site not specified: Secondary | ICD-10-CM | POA: Insufficient documentation

## 2014-07-03 DIAGNOSIS — E785 Hyperlipidemia, unspecified: Secondary | ICD-10-CM | POA: Insufficient documentation

## 2014-07-03 LAB — LIPID PANEL
CHOLESTEROL: 200 mg/dL (ref 0–200)
HDL CHOLESTEROL: 52 mg/dL (ref >=35)
LDL CHOLESTEROL CALCULATION: 122 mg/dL (ref <130)
NON-HDL CHOLESTEROL: 148 mg/dL (ref 0–150)
TOTAL CHOLESTEROL:HDL RATIO: 3.9 (ref <4.0)
TRIGLYCERIDE: 132 mg/dL (ref 35–160)

## 2014-07-03 LAB — VITAMIN D, 25 HYDROXY: VITAMIN D, 25 HYDROXY: 24.4 ng/mL — AB (ref 30.0–100.0)

## 2014-07-03 LAB — COMPREHENSIVE METABOLIC PANEL
ALANINE TRANSFERASE (ALT): 40 U/L (ref 5–54)
ALBUMIN: 4.4 g/dL (ref 3.4–4.8)
ALKALINE PHOSPHATASE (ALP): 64 U/L (ref 35–115)
ASPARTATE TRANSAMINASE (AST): 31 U/L (ref 15–43)
BILIRUBIN TOTAL: 0.4 mg/dL (ref 0.3–1.3)
CALCIUM: 9.3 mg/dL (ref 8.6–10.5)
CARBON DIOXIDE TOTAL: 26 meq/L (ref 24–32)
CHLORIDE: 100 meq/L (ref 95–110)
CREATININE BLOOD: 0.66 mg/dL (ref 0.44–1.27)
GLUCOSE: 95 mg/dL (ref 70–99)
POTASSIUM: 4.2 meq/L (ref 3.3–5.0)
PROTEIN: 7.2 g/dL (ref 6.3–8.3)
SODIUM: 137 meq/L (ref 135–145)
UREA NITROGEN, BLOOD (BUN): 20 mg/dL (ref 8–22)

## 2014-07-03 LAB — HEMOGLOBIN A1C
HGB A1C,GLUCOSE EST AVG: 111 mg/dL
HGB A1C: 5.5 % (ref 3.9–5.6)

## 2014-07-03 LAB — CBC NO DIFFERENTIAL
HEMATOCRIT: 42.8 % (ref 36–46)
HEMOGLOBIN: 13.6 g/dL (ref 12.0–16.0)
MCH: 31.5 pg (ref 27–33)
MCHC: 31.7 % — AB (ref 32–36)
MCV: 99.5 UM3 (ref 80–100)
MPV: 8.2 UM3 (ref 6.8–10.0)
PLATELET COUNT: 221 10*3/uL (ref 130–400)
RDW: 13.3 U (ref 0–14.7)
RED CELL COUNT: 4.31 10*6/uL (ref 4.0–5.2)
WHITE BLOOD CELL COUNT: 5.9 10*3/uL (ref 4.5–11.0)

## 2014-07-05 LAB — CULTURE URINE, BACTI

## 2014-07-20 ENCOUNTER — Ambulatory Visit: Payer: Commercial Managed Care - HMO

## 2014-07-20 DIAGNOSIS — J301 Allergic rhinitis due to pollen: Secondary | ICD-10-CM

## 2014-07-20 NOTE — Nursing Note (Signed)
After 30 minutes observation, Patient's injection sites were checked for any local reaction. Patient then left the clinic feeling fine.  Local reaction was 0 (wheal/erythema).  Siyon Linck C Dreydon Cardenas, RN

## 2014-07-20 NOTE — Nursing Note (Signed)
Patient here for Immunotherapy.  Patient and antigen were identified with 2 identifiers, including patient's confirmation of correct antigen bottle(s).  Patient's condition was then assessed, which included any immediate or delayed reaction to previous dose, current health status, Peak flow reading if indicated, use of any new meds such as antibiotic or Beta Blocker, if antihistamine had been taken within the previous 24 hours, and length of time since last allergy injection(s).    The allergy injection(s) were then administered per the build up schedule.  Patients dose was repeated.(reduced, repeated, advanced).   Patient received 0.50 of 1:1 Aq Tree/Grass/Weed in left arm .( dose, concentration, name of antigen).  Suzan Slick, RN

## 2014-08-02 ENCOUNTER — Ambulatory Visit: Payer: Commercial Managed Care - HMO

## 2014-08-02 DIAGNOSIS — J301 Allergic rhinitis due to pollen: Secondary | ICD-10-CM

## 2014-08-02 NOTE — Nursing Note (Signed)
Patient here for Immunotherapy.  Patient and antigen were identified with 2 identifiers, including patient's confirmation of correct antigen bottle(s).  Patient's condition was then assessed, which included any immediate or delayed reaction to previous dose, current health status, Peak flow reading if indicated, use of any new meds such as antibiotic or Beta Blocker, if antihistamine had been taken within the previous 24 hours, and length of time since last allergy injection(s).    The allergy injection(s) were then administered per the build up schedule.  Patients dose was repeated.(reduced, repeated, advanced).   Patient received 0.50 of 1:1 Aq Tree/Grass/Weed in right arm .( dose, concentration, name of antigen).  Suzan Slick, RN      After 30 minutes observation, Patient's injection sites were checked for any local reaction. Patient then left the clinic feeling fine.  Local reaction was 0 with a lump.   (wheal/erythema).  Suzan Slick, RN

## 2014-08-17 ENCOUNTER — Other Ambulatory Visit: Payer: Self-pay

## 2014-08-17 ENCOUNTER — Ambulatory Visit: Payer: Commercial Managed Care - HMO

## 2014-08-17 DIAGNOSIS — J301 Allergic rhinitis due to pollen: Secondary | ICD-10-CM

## 2014-08-17 NOTE — Nursing Note (Signed)
Patient here for Immunotherapy.  Patient and antigen were identified with 2 identifiers, including patient's confirmation of correct antigen bottle(s).  Patient's condition was then assessed, which included any immediate or delayed reaction to previous dose, current health status, Peak flow reading if indicated, use of any new meds such as antibiotic or Beta Blocker, if antihistamine had been taken within the previous 24 hours, and length of time since last allergy injection(s).    The allergy injection(s) were then administered per the build up schedule.  Patients dose was repeated.(reduced, repeated, advanced).   Patient received 0.50 of 1:1 Aq Tree/Grass/Weed subcutaneous in left arm .( dose, concentration, name of antigen).  Suzan Slick, RN      After 30 minutes observation, Patient's injection sites were checked for any local reaction. Patient then left the clinic feeling fine.  Local reaction was 0 (wheal/erythema).  Suzan Slick, RN

## 2014-08-22 ENCOUNTER — Encounter: Payer: Self-pay | Admitting: Family Medicine

## 2014-08-22 ENCOUNTER — Ambulatory Visit: Payer: Commercial Managed Care - HMO | Admitting: Family Medicine

## 2014-08-22 VITALS — BP 118/74 | HR 88 | Temp 97.0°F | Resp 12 | Wt 144.2 lb

## 2014-08-22 DIAGNOSIS — Z1211 Encounter for screening for malignant neoplasm of colon: Secondary | ICD-10-CM

## 2014-08-22 DIAGNOSIS — D369 Benign neoplasm, unspecified site: Secondary | ICD-10-CM

## 2014-08-22 DIAGNOSIS — R109 Unspecified abdominal pain: Principal | ICD-10-CM

## 2014-08-22 DIAGNOSIS — R1012 Left upper quadrant pain: Secondary | ICD-10-CM

## 2014-08-22 DIAGNOSIS — Z79890 Hormone replacement therapy (postmenopausal): Secondary | ICD-10-CM

## 2014-08-22 LAB — POC URINALYSIS DIPSTICK AUTOMATED W/O MICROSCOPY
POC BILIRUBIN URINE: NEGATIVE
POC GLUCOSE URINE: NEGATIVE
POC KETONES: NEGATIVE
POC LEUK. ESTERASE: NEGATIVE
POC NITRITE URINE: NEGATIVE
POC OCCULT BLOOD URINE: NEGATIVE
POC PH URINALYSIS: 7.5
POC PROTEIN URINE: NEGATIVE
POC SP GRAVITY: 1.015
POC UROBILINOGEN: 0.2

## 2014-08-22 MED ORDER — ESTRADIOL 0.05 MG-NORETHINDRONE 0.25 MG/24 HR SEMIWKLY TRANSDERM PATCH
1.0000 | MEDICATED_PATCH | TRANSDERMAL | Status: DC
Start: 2014-08-22 — End: 2014-08-31

## 2014-08-22 NOTE — Patient Instructions (Signed)
To schedule your abdominal computed tomography scan, call [phone].     Family Practice, [place] [facility]  [facility]      I have ordered your non fasting blood tests.  You can drop in to the downstairs lab any time between 7:30 am and 4pm, except closed for lunch 12:30-1PM. (a lab appointment is not needed).  [name], MD

## 2014-08-22 NOTE — Nursing Note (Signed)
Vital signs taken,tobacco,allergies,pharmacy verified, screened for pain.  Gwenette Wellons, MA

## 2014-08-22 NOTE — Nursing Note (Signed)
UA done. Marisa Tuazon, MA I

## 2014-08-22 NOTE — Progress Notes (Signed)
08/22/2014   EPIC FAMILY MEDICINE VISIT: MRN# [id number]  Name: Marisa Jenkins  Date of birth: 12-Jan-1956  Primary care physician is [name], MD     Chief Complaint   Patient presents with    Pain     side pain X 4 years         SUBJECTIVE:  Marisa Jenkins is a 59yr old female who is here for an  Ongoing worsening left upper quadrant pain, no fever, nausea or vomiting; no urinary symptoms.            Patient Active Problem List   Diagnosis    Hyperlipidemia    Postmenopausal HRT (hormone replacement therapy)    Migraine headache    Allergic rhinitis    Anxiety disorder    Impaired fasting glucose    Pain in joint of right hand    Internal hemorrhoids    Allergic rhinitis due to pollen    Cerumen impaction    Injury of right foot    Libido, decreased       REVIEW of SYSTEMS :  Review of Systems   Constitutional: Positive for appetite change. Negative for fever and unexpected weight change.   HENT: Negative.  Negative for rhinorrhea and sore throat.    Eyes: Negative.    Respiratory: Negative.    Cardiovascular: Negative.    Gastrointestinal: Positive for abdominal pain and abdominal distention. Negative for nausea, vomiting, diarrhea, constipation and blood in stool.   Genitourinary: Negative for dysuria, flank pain, vaginal bleeding and vaginal discharge.   Neurological: Negative.    Psychiatric/Behavioral: Negative.        OBJECTIVE:  Vital signs:    BP 118/74 mmHg  Pulse 88  Temp(Src) 36.1 C (97 F) (Tympanic)  Resp 12  Wt 65.409 kg (144 lb 3.2 oz)  SpO2 99%  LMP 10/28/2010   General Appearance: healthy, alert, no distress, pleasant affect, cooperative.  Eyes:  conjunctivae and corneas clear. PERRL, EOM's intact. sclerae normal.  Mouth: normal.  Neck:  Neck supple. No adenopathy, thyroid symmetric, normal size.  Heart:  normal rate and regular rhythm, no murmurs, clicks, or gallops.  Lungs: clear to auscultation.  Abdomen: BS normal.  Abdomen soft, non-tender.  No masses or  organomegaly.  Extremities:  no cyanosis, clubbing, or edema and distal pulses normal.  Skin:  Skin color, texture, turgor normal. No rashes or lesions.  Pelvic:  External genitalia and vagina normal, bimanual and rectovaginal normal.  Mental Status: euthymic        ASSESSMENT:  (R10.9) Abdominal pain, unspecified abdominal location  (primary encounter diagnosis)  Comment: unknown diagnosis.  Suspect diverticulitis.    Plan: POC URINALYSIS DIPSTICK AUTOMATED W/O         MICROSCOPY, CT ABDOMEN WITH CONTRAST,         GASTROENTEROLOGY REFERRAL, CBC WITH         DIFFERENTIAL, BASIC METABOLIC PANEL, HEPATIC         FUNCTION PANEL, AMYLASE            (Z79.890) Postmenopausal HRT (hormone replacement therapy)  Comment:   Plan: Estradiol-Norethindrone Acet (COMBIPATCH)         0.05-0.25 mg/24 hr Semiweekly Patch        Can't find this medication at the pharmacy. May be production shortage. She will check with another pharmacy.     (D36.9) Tubular adenoma  Comment:  Plan: GASTROENTEROLOGY REFERRAL            (  Z12.11) Screen for colon cancer  Comment:   Plan: GASTROENTEROLOGY REFERRAL        Advised high fiber diet due to history of tubular adenoma       Return to clinic: as needed  Advised to call back if symptoms fail to improve as expected or worsen.     Total visit time: 25 minutes, of which  30 % was spent face-to-face counseling about the following diagnoses/ health problems:    1. Abdominal pain, unspecified abdominal location    2. Postmenopausal HRT (hormone replacement therapy)    3. Tubular adenoma    4. Screen for colon cancer       Barriers to Learning assessed: none. Patient verbalizes understanding of teaching and instructions and agrees with the plan of care.    I reviewed the patient's past medical and family/social history, and updated problem list and past medical history .  Barriers to Learning assessed: none. Patient verbalizes understanding of teaching and instructions.  [name], MD  [facility]  [facility]     This document has been created using Dragon Naturally Speaking and has been checked for content. Some voice recognition errors may have been missed inadvertantly.

## 2014-08-23 ENCOUNTER — Encounter: Payer: Self-pay | Admitting: Family Medicine

## 2014-08-23 DIAGNOSIS — R109 Unspecified abdominal pain: Secondary | ICD-10-CM | POA: Insufficient documentation

## 2014-08-23 DIAGNOSIS — D369 Benign neoplasm, unspecified site: Secondary | ICD-10-CM | POA: Insufficient documentation

## 2014-08-23 HISTORY — DX: Benign neoplasm, unspecified site: D36.9

## 2014-08-29 ENCOUNTER — Ambulatory Visit: Payer: Commercial Managed Care - HMO | Attending: Family Medicine

## 2014-08-29 DIAGNOSIS — R109 Unspecified abdominal pain: Principal | ICD-10-CM | POA: Insufficient documentation

## 2014-08-29 LAB — CBC WITH DIFFERENTIAL
BASOPHILS % AUTO: 0.4 %
BASOPHILS ABS AUTO: 0 10*3/uL (ref 0–0.2)
EOSINOPHIL % AUTO: 2.1 %
EOSINOPHIL ABS AUTO: 0.1 10*3/uL (ref 0–0.5)
HEMATOCRIT: 40.1 % (ref 36–46)
HEMOGLOBIN: 13.4 g/dL (ref 12.0–16.0)
LYMPHOCYTE ABS AUTO: 1.9 10*3/uL (ref 1.0–4.8)
LYMPHOCYTES % AUTO: 37 %
MCH: 32.4 pg (ref 27–33)
MCHC: 33.3 % (ref 32–36)
MCV: 97.4 UM3 (ref 80–100)
MONOCYTES % AUTO: 6.7 %
MONOCYTES ABS AUTO: 0.3 10*3/uL (ref 0.1–0.8)
MPV: 8.5 UM3 (ref 6.8–10.0)
NEUTROPHIL ABS AUTO: 2.7 10*3/uL (ref 1.80–7.70)
NEUTROPHILS % AUTO: 53.8 %
PLATELET COUNT: 238 10*3/uL (ref 130–400)
RDW: 12.9 U (ref 0–14.7)
RED CELL COUNT: 4.12 10*6/uL (ref 4.0–5.2)
WHITE BLOOD CELL COUNT: 5 10*3/uL (ref 4.5–11.0)

## 2014-08-29 LAB — BASIC METABOLIC PANEL
CALCIUM: 8.9 mg/dL (ref 8.6–10.5)
CARBON DIOXIDE TOTAL: 27 meq/L (ref 24–32)
CHLORIDE: 102 meq/L (ref 95–110)
CREATININE BLOOD: 0.54 mg/dL (ref 0.44–1.27)
GLUCOSE: 104 mg/dL — AB (ref 70–99)
POTASSIUM: 3.9 meq/L (ref 3.3–5.0)
SODIUM: 138 meq/L (ref 135–145)
UREA NITROGEN, BLOOD (BUN): 14 mg/dL (ref 8–22)

## 2014-08-29 LAB — HEPATIC FUNCTION PANEL
ALANINE TRANSFERASE (ALT): 36 U/L (ref 5–54)
ALBUMIN: 4.2 g/dL (ref 3.4–4.8)
ALKALINE PHOSPHATASE (ALP): 61 U/L (ref 35–115)
ASPARTATE TRANSAMINASE (AST): 30 U/L (ref 15–43)
BILIRUBIN TOTAL: 0.5 mg/dL (ref 0.3–1.3)
PROTEIN: 7.1 g/dL (ref 6.3–8.3)

## 2014-08-29 LAB — AMYLASE: AMYLASE: 27 U/L — AB (ref 33–130)

## 2014-08-31 ENCOUNTER — Telehealth: Payer: Self-pay | Admitting: Family Medicine

## 2014-08-31 ENCOUNTER — Ambulatory Visit: Payer: Commercial Managed Care - HMO

## 2014-08-31 DIAGNOSIS — J301 Allergic rhinitis due to pollen: Secondary | ICD-10-CM

## 2014-08-31 MED ORDER — ESTRADIOL 0.05 MG-NORETHINDRONE 0.14 MG/24 HR SEMIWKLY TRANSDERM PATCH
1.0000 | MEDICATED_PATCH | TRANSDERMAL | Status: DC
Start: 2014-08-31 — End: 2014-10-02

## 2014-08-31 NOTE — Nursing Note (Signed)
After 30 minutes observation, Patient's injection sites were checked for any local reaction. Patient then left the clinic feeling fine.  Local reaction was 0 (wheal/erythema).  Kinlie Janice C Leinani Lisbon, RN

## 2014-08-31 NOTE — Nursing Note (Signed)
Patient here for Immunotherapy.  Patient and antigen were identified with 2 identifiers, including patient's confirmation of correct antigen bottle(s).  Patient's condition was then assessed, which included any immediate or delayed reaction to previous dose, current health status, Peak flow reading if indicated, use of any new meds such as antibiotic or Beta Blocker, if antihistamine had been taken within the previous 24 hours, and length of time since last allergy injection(s).    The allergy injection(s) were then administered per the build up schedule.  Patients dose was repeateed.(reduced, repeated, advanced).   Patient received 0.50 of 1:1 Aq Tree/Grass/Weed in right arm .( dose, concentration, name of antigen).  Suzan Slick, RN

## 2014-08-31 NOTE — Telephone Encounter (Signed)
Thank you.  Prescription changed. Clodagh Odenthal, MD

## 2014-08-31 NOTE — Telephone Encounter (Signed)
Leveda Anna from [facility] called to get a change of order for the pt patches the order was 0.05-0.25 but what they have on hand is 0.05-.014. The patient called many pharmacies and they are the only ones who have the patches Leveda Anna reached out to the patient to let her know it is a lower dose and the pt said that that was fine. Number for Leveda Anna at [facility] is [phone] please advise   Adline Mango, [place]

## 2014-09-04 ENCOUNTER — Telehealth: Payer: Self-pay | Admitting: Family Medicine

## 2014-09-04 NOTE — Telephone Encounter (Signed)
Pharmacy notified. No refills . Tiana Loft, [place] I

## 2014-09-04 NOTE — Telephone Encounter (Signed)
Yes, 90 day supply is okay (dispense 25). Oneil Behney Eliezer Bottom, MD

## 2014-09-04 NOTE — Telephone Encounter (Signed)
Pharmacy [facility]  [place]  [id number] calling they received prescription for Estradiol-Norethindrone Acet (COMBIPATCH) 0.05-0.14 mg/24 hr Semiweekly Patch but they are asking if patient can have 90 day supply instead of 30?    Thank you,     Cindy Hazy

## 2014-09-05 ENCOUNTER — Ambulatory Visit
Admission: RE | Admit: 2014-09-05 | Discharge: 2014-09-05 | Disposition: A | Discharge status: Home / Self Care | Payer: Commercial Managed Care - HMO | Source: Ambulatory Visit | Attending: Family Medicine | Admitting: Family Medicine

## 2014-09-05 DIAGNOSIS — R1012 Left upper quadrant pain: Secondary | ICD-10-CM

## 2014-09-05 DIAGNOSIS — R109 Unspecified abdominal pain: Principal | ICD-10-CM | POA: Insufficient documentation

## 2014-09-05 MED ORDER — IOHEXOL 350 MG IODINE/ML INTRAVENOUS SOLUTION
120.0000 mL | INTRAVENOUS | Status: AC
Start: 2014-09-05 — End: 2014-09-05
  Administered 2014-09-05: 120 mL via INTRAVENOUS

## 2014-09-20 ENCOUNTER — Encounter: Payer: Self-pay | Admitting: Family Medicine

## 2014-09-20 NOTE — Telephone Encounter (Signed)
From: [name]  To: [name], MD  Sent: 09/19/2014 7:08 PM PDT  Subject: Non-urgent Medical Advice Question    I would like to request a renewal of the valium prescription

## 2014-09-22 ENCOUNTER — Ambulatory Visit: Payer: Commercial Managed Care - HMO

## 2014-09-22 ENCOUNTER — Other Ambulatory Visit: Payer: Self-pay

## 2014-09-22 DIAGNOSIS — J301 Allergic rhinitis due to pollen: Secondary | ICD-10-CM

## 2014-09-22 MED ORDER — DIAZEPAM 5 MG TABLET
5.0000 mg | ORAL_TABLET | Freq: Four times a day (QID) | ORAL | 0 refills | Status: DC | PRN
  Filled 2014-09-22: qty 30, 8d supply, fill #0

## 2014-09-22 NOTE — Telephone Encounter (Signed)
Prescription printed, signed and ready to fax to pharmacy. Taje Littler, MD

## 2014-09-22 NOTE — Nursing Note (Signed)
After 30 minutes observation, Patient's injection sites were checked for any local reaction. Patient then left the clinic feeling fine.  Local reaction was 0 wheal or erythema, but with a lump underneath the injection site. (wheal/erythema).  Suzan Slick, RN

## 2014-09-22 NOTE — Telephone Encounter (Signed)
Faxed. Kern Gingras, MA I

## 2014-09-22 NOTE — Nursing Note (Signed)
Patient here for Immunotherapy.  Patient and antigen were identified with 2 identifiers, including patient's confirmation of correct antigen bottle(s).  Patient's condition was then assessed, which included any immediate or delayed reaction to previous dose, current health status, Peak flow reading if indicated, use of any new meds such as antibiotic or Beta Blocker, if antihistamine had been taken within the previous 24 hours, and length of time since last allergy injection(s).    The allergy injection(s) were then administered per the build up schedule.  Patients dose was repeated.(reduced, repeated, advanced).   Patient received 0.50 of 1:1 Aq Tree/Grass/Weed subcutaneous in left arm .( dose, concentration, name of antigen).  Letasha Kershaw C Jahsiah Carpenter, RN

## 2014-10-02 ENCOUNTER — Encounter: Payer: Self-pay | Admitting: Family Medicine

## 2014-10-04 MED ORDER — ESTRADIOL 0.05 MG-NORETHINDRONE 0.14 MG/24 HR SEMIWKLY TRANSDERM PATCH
1.0000 | MEDICATED_PATCH | TRANSDERMAL | Status: DC
Start: 2014-10-05 — End: 2014-12-18

## 2014-10-11 ENCOUNTER — Other Ambulatory Visit: Payer: Self-pay

## 2014-10-11 MED ORDER — PEG 3350 240 GRAM-ELECTROLYTES 22.72 GRAM-6.72 G-5.84 G POWDR FOR SOLN
ORAL | 0 refills | Status: DC
  Filled 2014-10-11: qty 4000, 1d supply, fill #0

## 2014-10-13 ENCOUNTER — Ambulatory Visit: Payer: Commercial Managed Care - HMO

## 2014-10-13 DIAGNOSIS — J301 Allergic rhinitis due to pollen: Secondary | ICD-10-CM

## 2014-10-13 NOTE — Nursing Note (Signed)
Patient here for Immunotherapy.  Patient and antigen were identified with 2 identifiers, including patient's confirmation of correct antigen bottle(s).  Patient's condition was then assessed, which included any immediate or delayed reaction to previous dose, current health status, Peak flow reading if indicated, use of any new meds such as antibiotic or Beta Blocker, if antihistamine had been taken within the previous 24 hours, and length of time since last allergy injection(s).    The allergy injection(s) were then administered per the build up schedule.  Patients dose was repeated.(reduced, repeated, advanced).   Patient received 0.50 of 1:1 Aq Tree/Grass/Weed subcutaneous in right arm .( dose, concentration, name of antigen).  Suzan Slick, RN      After 30 minutes observation, Patient's injection sites were checked for any local reaction. Patient then left the clinic feeling fine.  Local reaction was 0 (wheal/erythema).  Suzan Slick, RN

## 2014-10-19 ENCOUNTER — Other Ambulatory Visit: Payer: Self-pay

## 2014-10-31 ENCOUNTER — Ambulatory Visit: Payer: Commercial Managed Care - HMO

## 2014-10-31 DIAGNOSIS — J301 Allergic rhinitis due to pollen: Secondary | ICD-10-CM

## 2014-10-31 NOTE — Nursing Note (Signed)
After 30 minutes observation, Patient's injection sites were checked for any local reaction. Patient then left the clinic feeling fine.  Local reaction was 0 (wheal/erythema).  Mahrosh Donnell C Shery Wauneka, RN

## 2014-10-31 NOTE — Nursing Note (Signed)
Patient here for Immunotherapy.  Patient and antigen were identified with 2 identifiers, including patient's confirmation of correct antigen bottle(s).  Patient's condition was then assessed, which included any immediate or delayed reaction to previous dose, current health status, Peak flow reading if indicated, use of any new meds such as antibiotic or Beta Blocker, if antihistamine had been taken within the previous 24 hours, and length of time since last allergy injection(s).    The allergy injection(s) were then administered per the build up schedule.  Patients dose was repeated.(reduced, repeated, advanced).   Patient received 0.50 of 1:1 Aq Tree/Grass/Weed subcutaneous in left arm .( dose, concentration, name of antigen).  Cid Agena C Edna Rede, RN

## 2014-11-09 DIAGNOSIS — C20 Malignant neoplasm of rectum: Secondary | ICD-10-CM | POA: Insufficient documentation

## 2014-11-09 HISTORY — DX: Malignant neoplasm of rectum: C20

## 2014-11-14 ENCOUNTER — Other Ambulatory Visit: Payer: Self-pay

## 2014-11-15 ENCOUNTER — Telehealth: Payer: Self-pay | Admitting: Gastroenterology

## 2014-11-15 NOTE — Telephone Encounter (Signed)
Unable to reach patient. Message left thru voicemail to confirm her upcoming procedure on 11/24/2014 @ 1130am  and to call back if she has any question.

## 2014-11-16 ENCOUNTER — Ambulatory Visit: Payer: Commercial Managed Care - HMO

## 2014-11-16 DIAGNOSIS — J301 Allergic rhinitis due to pollen: Secondary | ICD-10-CM

## 2014-11-16 NOTE — Nursing Note (Signed)
Patient here for Immunotherapy.  Patient and antigen were identified with 2 identifiers, including patient's confirmation of correct antigen bottle(s).  Patient's condition was then assessed, which included any immediate or delayed reaction to previous dose, current health status, Peak flow reading if indicated, use of any new meds such as antibiotic or Beta Blocker, if antihistamine had been taken within the previous 24 hours, and length of time since last allergy injection(s).    The allergy injection(s) were then administered per the build up schedule.  Patients dose was repeated.(reduced, repeated, advanced).   Patient received 0.50 of 1:1 Aq Tree/Grass/Weed subcutaneous in right arm .( dose, concentration, name of antigen).  Mammie Meras C Rylen Hou, RN

## 2014-11-16 NOTE — Nursing Note (Signed)
After 30 minutes observation, Patient's injection sites were checked for any local reaction. Patient then left the clinic feeling fine.  Local reaction was 0 (wheal/erythema).  Suede Greenawalt C Dylynn Ketner, RN

## 2014-11-23 MED ORDER — SODIUM CHLORIDE 0.9 % INTRAVENOUS SOLUTION
INTRAVENOUS | 0 refills | Status: DC
Start: 2014-11-23 — End: 2014-11-24

## 2014-11-23 MED ORDER — DIPHENHYDRAMINE 50 MG/ML INJECTION SOLUTION
INTRAMUSCULAR | 0 refills | Status: DC
Start: 2014-11-23 — End: 2014-11-24

## 2014-11-23 MED ORDER — LIDOCAINE HCL 2 % MUCOSAL SOLUTION
0 refills | Status: DC
Start: 2014-11-23 — End: 2014-11-24

## 2014-11-23 MED ORDER — MIDAZOLAM (PF) 1 MG/ML INJECTION SOLUTION
INTRAMUSCULAR | 0 refills | Status: DC
Start: 2014-11-23 — End: 2014-11-24

## 2014-11-23 MED ORDER — PROMETHAZINE 25 MG/ML INJECTION SOLUTION
INTRAMUSCULAR | 0 refills | Status: DC
Start: 2014-11-23 — End: 2014-11-24

## 2014-11-23 MED ORDER — FENTANYL (PF) 50 MCG/ML INJECTION SOLUTION
INTRAMUSCULAR | 0 refills | Status: DC
Start: 2014-11-23 — End: 2014-11-24

## 2014-11-24 ENCOUNTER — Other Ambulatory Visit: Payer: Self-pay

## 2014-11-24 ENCOUNTER — Ambulatory Visit: Payer: Commercial Managed Care - HMO | Attending: Psychiatry | Admitting: Gastroenterology

## 2014-11-24 ENCOUNTER — Encounter: Payer: Self-pay | Admitting: Gastroenterology

## 2014-11-24 VITALS — BP 123/56 | HR 88 | Temp 96.9°F | Resp 18 | Ht 65.0 in | Wt 135.9 lb

## 2014-11-24 DIAGNOSIS — K635 Polyp of colon: Secondary | ICD-10-CM | POA: Insufficient documentation

## 2014-11-24 DIAGNOSIS — D012 Carcinoma in situ of rectum: Principal | ICD-10-CM | POA: Insufficient documentation

## 2014-11-24 DIAGNOSIS — Z86010 Personal history of colonic polyps: Secondary | ICD-10-CM | POA: Insufficient documentation

## 2014-11-24 DIAGNOSIS — K259 Gastric ulcer, unspecified as acute or chronic, without hemorrhage or perforation: Secondary | ICD-10-CM | POA: Insufficient documentation

## 2014-11-24 DIAGNOSIS — R1012 Left upper quadrant pain: Secondary | ICD-10-CM | POA: Insufficient documentation

## 2014-11-24 DIAGNOSIS — K621 Rectal polyp: Secondary | ICD-10-CM | POA: Insufficient documentation

## 2014-11-24 HISTORY — PX: COLONOSCOPY: GILAB00002

## 2014-11-24 HISTORY — PX: ESOPHAGOGASTRODUODENOSCOPY (EGD): SHX000442

## 2014-11-24 HISTORY — PX: HC UPPER GI ENDOSCOPY,BIOPSY: 750000010

## 2014-11-24 HISTORY — PX: COLONOSCOPY: SHX000273

## 2014-11-24 MED ORDER — OMEPRAZOLE 20 MG CAPSULE,DELAYED RELEASE
20.0000 mg | DELAYED_RELEASE_CAPSULE | Freq: Two times a day (BID) | ORAL | 1 refills | Status: DC
Start: 2014-11-24 — End: 2015-07-20
  Filled 2014-11-24: qty 120, 60d supply, fill #0
  Filled 2015-02-07: qty 120, 60d supply, fill #1

## 2014-11-24 NOTE — Nursing Note (Signed)
Nursing Pre-Procedure Assessment    Marisa Jenkins arrived by ambulating  into clinic today at 1057 from home.      Patient has arranged for a ride home from [place] who can be contacted at [phone]. Patient agrees to having individual providing ride home present while the post procedure instructions and results are given.  Instructed patient that post sedation they are not to drive or drink alcohol for the remainder of the day.      Verified procedure with the patient scheduled today for: EGD and Colonoscopy.    Outpatient Prescriptions Marked as Taking for the 11/24/14 encounter (Office Visit) with Harley Alto, MD   Medication Sig Dispense Refill    0.9 % SODIUM CHLORIDE (NACL 0.9%) 0.9 % Parenteral Solution IV at [facility] or as directed by MD;  May use another 500 mL bag as needed. 2 BAG 0    0.9 % SODIUM CHLORIDE (NACL 0.9%) 0.9 % Parenteral Solution IV at [facility] or as directed by MD;  May use another 500 mL bag as needed. 2 BAG 0    DiphenhydrAMINE (BENADRYL) 50 mg/mL Injection 12.5-25 mg IV every 3 min PRN (NTE 50 mg); Give 25mg  PRN inadequate sedation after Midazolam given or as directed by MD 2 mL 0    Fentanyl (SUBLIMAZE) 50 mcg/mL Injection 12.5-50 mcg IV every 3 min PRN pain (NTE 200 mcg); Give 25 mcg until patient sedated but arousable or as directed by MD 4 mL 0    Lidocaine (XYLOCAINE) 2 % Viscous Solution Gargle and swallow 5 mL undiluted solution prior to GI Lab procedure. 15 mL 0    Midazolam (VERSED) 1 mg/mL Injection 0.5-2 mg IV every 3 min PRN sedation (NTE 10 mg); Give 1 mg until patient sedated but arousable or as directed by MD 10 mL 0    Promethazine (PHENERGAN) 25 mg/mL Injection 6.25-25 mg IV PRN (NTE 25 mg);Give 6.25 mg inadequate sedation after Diphenhydramine given or after Midazolam given if patient has Diphenhydramine contraindication or as directed by MD; Administer IV push over at least 2 minutes with minimum 10 mL dilution in NS. 1 mL 0     Anticoagulants: Patient denies taking  Aspirin, NSAIDs, or other anticoagulants for the past 5 days.  OTC/Herbal preparations: no    Reviewed with the patient her health status in regards to allergies (including latex), medications, pregnancy, hypertension, atrial fibrilation, liver disease, bleeding disorders, diabetes,  CVA's, seizures, sleep apnea, glaucoma, cancer, prosthesis or implants, infectious diseases, surgical history; and disease of heart, lungs, liver, or kidneys. Positive history reviewed and updated in Health History section of the EMR.    Patient Active Problem List   Diagnosis    Hyperlipidemia    Postmenopausal HRT (hormone replacement therapy)    Migraine headache    Allergic rhinitis    Anxiety disorder    Impaired fasting glucose    Pain in joint of right hand    Internal hemorrhoids    Allergic rhinitis due to pollen    Cerumen impaction    Injury of right foot    Libido, decreased    Pain, abdominal    Tubular adenoma     Allergies   Allergen Reactions    Clindamycin Other-Reaction in Comments     heartburn    Penicillin G Hives     About 2010     Past Medical History   Diagnosis Date    Allergic rhinitis 04/29/2012    Anxiety disorder 04/29/2012  Hyperlipidemia 04/29/2012    Impaired fasting glucose     Internal hemorrhoids 02/27/2006    Menorrhagia     Migraine headache 04/29/2012    Pain in joint of right hand 2009     thumb joint    Postmenopausal HRT (hormone replacement therapy) 04/29/2012    Sciatica 2006    Thrombocytopenia 06/11/2006    Tubular adenoma 08/23/2014     Past Surgical History   Procedure Laterality Date    Tonsillectomy      Pr colsc flx w/rmvl of tumor polyp lesion snare tq  04/22/2011     tubular adenoma: recheck 05/2014    Pr lap procedure, unlisted, bladder  2003     bladder repair    Pr removal of leg veins/ulcer  02/2003     vein stripping  Dr. Vira Agar    Pr colsc flx w/rmvl of tumor polyp lesion snare tq  05/18/06     tubular adenoma    Tonsillectomy and adenoidectomy  1962     Biopsy breast  2008     benign    Biopsy breast Left 10/2009     benign     History   Smoking Status    Former Smoker   Smokeless Tobacco    Never Used     Comment: quit at age 47     History   Drug Use Not on file       Pre Procedure Checks:    Identaband on and two forms of identification information verified: yes  Pt completed bowel prep: GI Procedure  NPO since: 10am today clear liquid(prep)  Belongings are with patient under gurney.  Patient has: no glasses, dentures, jewelry, or hearing aides.  Barriers to Learning assessed: none. Patient verbalizes understanding of teaching and instructions.    Past anesthesia responses:  No problem.  Nursing Assessment Data Base:  Ventilation   --  Respirations: normal, unlabored.  Circulation/Perfusion -- Skin: warm, normal color and dry.  Cognition/Communication -- Behavior: alert and oriented and cooperative.  Gastro-Intestinal: no distress.  Abdomen: soft and non-tender.  Level of Ambulation: self.    Reviewed written post procedure instructions with the patient (What To Expect After Your Procedure).   Barriers to Learning assessed: none. Patient verbalizes understanding of teaching and instructions.  Nursing assessment completed by:   Marian Sorrow, RN, RN    Date: 11/24/2014 Time: 6438

## 2014-11-24 NOTE — Procedures (Signed)
Patient: Marisa Jenkins  Location: [place] record number: [id number]  Sex: female  Age: 64yr(s)  Date of birth: 04/15/1955    Procedure: Upper Endoscopy  Sub-procedure: Biopsy     Date of Service: 11/24/2014    Time start: 11:57  Time end: 12:00    Referring Physician: PCP: [name], MD.     PERFORMING SURGEON: Harley Alto, MD. I performed the entire procedure.    ASSISTANT SURGEON: None    GI Pre-procedure Indications:  LUQ pain    Details of the Procedure:    Informed consent was obtained for the procedure, including sedation. Risks of perforation, hemorrhage, adverse drug reaction, pain, infection and aspiration were discussed. The patient was placed in the left lateral decubitus position. Based on the pre-procedure assessment, including review of the patient's medical history, medications, allergies, and review of systems, she had been deemed to be an appropriate candidate for conscious sedation; she was therefore sedated with the medications listed below. The patient was monitored continuously with EKG tracing, pulse oximetry, blood pressure monitoring, and direct observations.     An oral examination was performed. The Olympus endoscope was inserted into the mouth and advanced under direct vision to the second portion of the duodenum.   A careful inspection was made as the gastroscope was withdrawn, including a retroflexed view of the proximal stomach; findings and interventions are described below. Appropriate photo documentation was obtained. The patient tolerated the procedure well, and there were no complications. She was taken to the recovery area in stable condition.     Sedation:   Sedation was administered for EGD and colonoscopy   Versed 6 mg IV, Fentanyl 150 mcg IV and Benadryl 50 mg IV & viscous lidocaine 2% gargle and swallow      Findings and Interventions:   Esophageal mucosa normal  Esophageal-gastric junction localized at 36 cm.  Squamo-columnar junction  regular  Hiatal hernia absent  Gastric mucosa showed few shallow (3-4) ulcers in the antrum measuring about 3-4 mm in size, without any stigmata of recent bleed. There was also patchy erythema in the antrum  Duodenal mucosa normal  Retroflexed view normal    Impression:   Small shallow antral ulcers not requiring any endoscopic therapy. Biopsies of antrum and body were obtained    Recommendation/Plan:    Start taking omeprazole 20 mg twice daily, half hour before meals for next 2 months. After this, you can attempt to lower down to once daily, and eventually to as needed basis as permitted by your symptoms.   Strictly avoid any NSAIDs   If melena, or other signs of GI bleed seen, patient will need further intervention (endoscopic)        Harley Alto, MD   11/24/2014    Harley Alto, MD  Attending Physician

## 2014-11-24 NOTE — Procedures (Signed)
Patient: Marisa Jenkins  Location: [place] record number: [id number]  Sex: female  Age: 67yr(s)  Date of birth: 12-06-1955    Date of Service: 11/24/2014    GI Pre-procedure Indications:  Previous adenomatous polyp    Procedure: Colonoscopy  Subprocedure:   Snare cautery polypectomy   Hemoclip placement at site of polypectomy     Referring Physician: PCP: [name], MD    PERFORMING SURGEON: Harley Alto, MD I performed the entire procedure    Endoscope insertion time: 12:03  Cecal intubation time: 12:07  Endoscope withdrawal time: 12:21    Extent of examination: cecum  Boston Bowel Preparation Score: 3+3+3      Details of the Procedure:    Informed consent was obtained for the procedure, including sedation.  Risks of perforation, hemorrhage, adverse drug reaction, pain, infection, missed lesion(s), incomplete examination, and aspiration were discussed.  A timeout was performed by nursing and medical staff to identify the patient (using two patient identifiers) and appropriate procedure. The patient was placed in the left lateral decubitus position. The patient was monitored continuously with EKG tracing, pulse oximetry, blood pressure monitoring, and direct observations.      A rectal examination was performed.  The Olympus colonoscope was inserted into the rectum and advanced under direct vision to the cecum, which was identified by the ileocecal valve and appendiceal orifice.  The quality of the colonic preparation was good.  A careful inspection was made as the colonscope was withdrawn, including a detailed view of the rectum; findings and interventions are described below.  Photo documentation was obtained.  The patient tolerance of the procedure was good, and there were no complications.  She was taken to the recovery area in stable condition.      Sedation:  Sedation was administered for the procedure, in addition to dosage given for EGD done prior  Fentanyl 25 mcg IV  Versed 1 mg  IV  Benadryl 0 mg IV    Findings and Interventions:      Cecum: normal  Ascending colon: normal  Transverse colon: normal  Descending colon: normal  Sigmoid colon: normal  Rectum: large 1 cm pedunculated polyp removed by snare cautery, followed by 1 hemoclip placement at site of polypectomy    Impression:  S/p snare cautery removal of 1 cm pedunculated rectal polyp.       Recommendation/Plan:  Await pathology result  High fiber diet  Low red meat         Harley Alto, MD  Attending Physician

## 2014-11-24 NOTE — Progress Notes (Signed)
GASTROENTEROLOGY/HEPATOLOGY PRE-PROCEDURE HISTORY & PHYSICAL    Date of Service: 11/24/2014    Referring Provider:  PCP: [name], MD  Procedure:  EGD and Colonoscopy    Attending: Harley Alto, MD  [place] Surgeon:None    SUBJECTIVE  HPI: This is a 59-yr old female with a year's history of intermittent left upper quadrant/flank throbbing pain that is unrelated to food or bowel movements, with on aggravating or relieving factors, which lasts a few minutes to hours at a time. There is no associated nausea, vomiting, diarrhea, bloating, constipation.     Family history of colon cancer- no  Family history of colon polyps- no  Last colonoscopy - 2013  Previous history of polyps- one tubular adenoma      History:  The patient's past medical, family, and social history was reviewed and confirmed.    ROS:    Constitutional: negative.  CV: negative.  Resp: negative.  GI: as above.  All other systems negative except as noted in the HPI.    I did review all available medical, surgical, personal/social history.    Physical Exam:   General Appearance: healthy, alert, no distress, pleasant affect, cooperative.   Eyes: conjunctivae and corneas clear. PERRL, EOM's intact. sclerae normal.   Ears: not examined.   Nose: not examined  Mouth: normal.   Neck: Neck supple. No adenopathy, thyroid symmetric, normal size.   Heart: normal rate and regular rhythm, no murmurs, clicks, or gallops.   Lungs: clear to auscultation.   Abdomen: BS normal.  Abdomen soft, non-tender.  No masses or organomegaly.   Extremities: no cyanosis, clubbing, or edema.   Skin: Skin color, texture, turgor normal. No rashes or lesions.   Rectal: not examined.   Neuro: not examined.   Mental Status: Appearance/Cooperation: in no apparent distress   Musculoskeletal: not examined    Labs/Imaging:  Performed at [facility], reviewed:  See computer database.  Performed outside of [place] (reviewed if applicable):  N/A    Impression: Colonoscopy for surveillance of  adenomatous polyps in the colon   EGD to evaluate left sided abdominal pain    Per patient, may discuss bx/procedure results with family member and/or leave message on voicemail at home phone.    Patient is a candidate for moderate sedation.  Patient is ASA status:  2 - Mild, controlled systemic disease and no functional limitations    Airway Assessment:    Mallampati image    Mallampati Class:  2, Oral Eval: Mouth opening normal, Neck ROM:  full, Thyroid-mentum distance in fingerbreaths: 4.    The procedure, risks, benefits, and alternatives were explained.  All patient questions were answered.  The informed consent was signed.    Patient barriers to learning:  none    Patient/family understanding:  verbalizes     Harley Alto, MD  Attending Physician

## 2014-11-24 NOTE — Nursing Note (Signed)
Pt dc instruction was discussed with family and patient. Both verbalyzed understanding. Pt dc via wheelchair in no acute distress.

## 2014-11-24 NOTE — Nursing Note (Signed)
Procedures well tolerated,biopsies taken without complications,patient to RR,report to RN.

## 2014-11-24 NOTE — Patient Instructions (Signed)
Discharge instructions for endoscopy and colonoscopy:     1. Small ulcers were found in your stomach. or erosions).   2. A large 1 cm rectal polyp was completely removed. I place a clip at this spot to prevent it from bleeding.        Activity:    Rest today, resume usual activitiy tomorrow     Diet:    Follow a diet as recommended for control of acid reflux symptoms (detailed below)  High fiber diet with low red meat promotes colon health    Medication:     Resume all usual medications    Start taking omeprazole 20 mg twice daily, half hour before meals for next 2 months. After this, you can attempt to lower down to once daily, and eventually to as needed basis as permitted by your symptoms.   Avoid Gastrotoxic Drugs (Motrin, Aleve, etc.). Talk to your PCP about the need for aspirin, since aspirin can cause ulcers.   If taking anti-acid medications chronically (few months or more at a stretch), take calcium supplementation with anti-acid medications as decreased gastric acid content can impeded dietary calcium absorption. It is recommended to take a total of 1500 mg of calcium daily. This amount should include both dietary intake and supplemental calcium taken as pills.      Follow up:    We will notify you of your biopsy results via phone call or letter.If you do not receive notice of your results by three weeks, please call us at the phone number provided below.     If you need to take anti-acid medications chronically (few months or more at a stretch), then you should have your magnesium levels checked by a blood test annually. Anti-acid medications can cause the magnesium levels to be low. If this does happen, the anti-acid medications should be stopped. Low magnesium levels can potential cause dangerous heart beat rhythms (arrhythmias). Vitamin B 12 level can also drop when taking anti-acid medication and should be checked annually   Follow-up with your primary care physician as  scheduled/needed    During your procedure, air was pumped into your GI tract so your doctor could see clearly to make a diagnosis and/or treat your problem.     Some possible side effects you may experience are:   - Discomfort due to a distended (bloated) abdomen which will subside after a few hours to two days.   - Nausea may be a side effect of the medication and will subside.   - The medications you received may make you dizzy and sleepy, it is important that you do not drive, operate machinery or drink alcohol for at least one day.   - Severe pain is not expected and should be reported    Other side effects may include:  Upper Endoscopy: A sore throat can be helped by cough drops, or a salt-water gargle.  Flex. Sig. Or Colonoscopy: A small amount of diarrhea may follow the exam.  Polyp Removal in Colon: A small amount of bleeding is not unusual. If it continues after one day, notify the doctor. If there is a large amount of bright red blood, go to the emergency Room and be seen by a doctor.    If problems, call [facility] at [phone] during business hours Monday through Friday 7:30am to 4:30 pm.  After hours call [phone] and ask to speak to the GI Fellow on call.  Colon Polyps    A polyp is extra tissue that grows inside your body. Colon polyps grow in the large intestine.  The large intestine, also called the colon, is part of your digestive system. It's a long, hollow tube at the end of your digestive tract where your body makes and stores stool.    Are polyps dangerous?  Most polyps are not dangerous. Most are benign which means they are not cancer. But over time, some types of polyps can turn into cancer. Usually, polyps that are smaller than a pea aren't harmful. But larger polyps could someday become cancer or may already be cancer. There are two common types of polyps, hyperplastic and adenomatous polyps.   Hyperplastic polyps generally remain small and are not precnacerous.  Adenoumatous polyps or adenomas, have the potential to grow large and become cancerous.  To be safe, doctors remove all polyps and test them to determine what type they are.    Who gets polyps?  Anyone can get polyps, but certain people are more likely than others. You may have a greater chance of getting polyps if you're over 75, you've had polyps before, someone in your family has had polyps, someone in your family has had cancer of the large intestine.   You may also be more likely to get polyps if you eat a lot of fatty foods, smoke, drink alcohol, don't exercise, or weigh too much.    What are the symptoms?  Most small polyps don't cause symptoms. Often, people don't know they have one until the doctor finds it during a regular checkup or while testing them for something else.  Some people do have symptoms including bleeding from the anus, constipation or diarrhea that lasts more than a week, blood in the stool. Blood can make stool look black, or it can show up as red streaks in the stool.  If you have any of these symptoms, see a doctor to find out what the problem is.    How does the doctor test for polyps?  The doctor can use four tests to check for polyps:   Digital rectal exam. The doctor wears gloves and checks your rectum, the last part of the large intestine, to see if it feels normal. This test would find polyps only in the rectum, so the doctor may need to do one of the other tests listed below to find polyps higher up in the intestine.   Barium enema. The doctor puts a liquid called barium into your rectum before taking x rays of your large intestine. Barium makes your intestine look white in the pictures. Polyps are dark, so they're easy to see.   Sigmoidoscopy:  With this test, the doctor can see inside your large intestine. The doctor puts a thin flexible tube into your rectum. The device is called a sigmoidoscope, and it  has a light and a tiny video camera in it. The doctor uses the sigmoidoscope to look at the last third of your large intestine.   Colonoscopy:  This test is like sigmoidoscopy, but the doctor looks at all of the large intestine. It usually requires sedation.  Who should get tested for polyps?  Talk to your doctor about getting tested for polyps if you have symptoms, you're 7 years old or older, someone in your family has had polyps or colon cancer.    How are polyps treated?  The doctor will remove the polyp. Sometimes, the doctor takes it out during sigmoidoscopy or colonoscopy.  Or the doctor may decide to operate through the abdomen.  The polyp is then tested for cancer.  If you've had polyps, the doctor may want you to get tested regularly in the future.    How can I prevent polyps?  Doctors don't know of any one sure way to prevent polyps. But you might be able to lower your risk of getting them if you eat more fruits and vegetables and less fatty food, don't smoke, avoid alcohol, exercise every day, lose weight if you're overweight.      Eating more calcium and folate can also lower your risk of getting polyps. Some foods that are rich in calcium are milk, cheese, and broccoli. Some foods that are rich in folate are chickpeas, kidney beans, and spinach.  Some doctors think that aspirin might help prevent polyps. Studies are under way.     Points to Remember   A polyp is extra tissue that grows inside the body. Most polyps are not harmful.   Symptoms may include constipation or diarrhea for more than a week or blood on your underwear, on toilet paper, or in your stool.   Many polyps do not cause symptoms.   Doctors remove all polyps and test them for cancer.   Talk to your doctor about getting tested for polyps if  o you have any symptoms  o you're 83 years old or older  o someone in your family has had polyps or colon  cancer          Heartburn & Gastroesophageal Reflux Disease (GERD)  Gastroesophageal reflux disease, or GERD, occurs when the lower esophageal sphincter (LES) does not close properly and stomach contents leak back, or reflux, into the esophagus. The LES is a ring of muscle at the bottom of the esophagus that acts like a valve between the esophagus and stomach. The esophagus carries food from the mouth to the stomach.  When refluxed stomach acid touches the lining of the esophagus, it causes a burning sensation in the chest or throat called heartburn. The fluid may even be tasted in the back of the mouth, and this is called acid indigestion. Occasional heartburn is common but does not necessarily mean one has GERD. Heartburn that occurs more than twice a week may be considered GERD, and it can eventually lead to more serious health problems.  What are the symptoms of GERD?  The main symptoms are persistent heartburn and acid regurgitation. Some people have GERD without heartburn. Instead, they experience pain in the chest, hoarseness in the morning, or trouble swallowing. You may feel like you have food stuck in your throat or like you are choking or your throat is tight. GERD can also cause a dry cough and bad breath.    What causes GERD?  No one knows why people get GERD. A hiatal hernia may contribute. A hiatal hernia occurs when the upper part of the stomach is above the diaphragm, the muscle wall that separates the stomach from the chest. The diaphragm helps the LES keep acid from coming up into the esophagus. When a hiatal hernia is present, it is easier for the acid to come up. In this way, a hiatal hernia can cause reflux. A hiatal hernia can happen in people of any age; many otherwise healthy people over 60 have a small one.  Other factors that may contribute to GERD  include, alcohol use, overweight, pregnancy, and smoking.    Also, certain foods can be associated with reflux  events, including citrus fruits, chocolate, drinks with caffeine, fatty and fried foods, garlic and onions, mint, flavorings, spicy foods, tomato-based foods, like spaghetti sauce, chili, and pizza.    How is GERD treated?  If you have had heartburn or any of the other symptoms for a while, you should see your doctor.  Depending on how severe your GERD is, treatment may involve one or more of the following lifestyle changes and medications or surgery.  Lifestyle changes that may improve GERD include smoking cessation, eliminating alcohol, losing weight if needed, eating small meals, wearing loose-fitting clothing, avoiding bedtime snacks and lying down for 3 hours after a meal and raising the head of your bed 6-8 inches by placing blocks of wood under the bedposts.    Medications  Your doctor may recommend over-the-counter antacids, which you can buy without a prescription, or medications that stop acid production or help the muscles that empty your stomach.    Antacids, such as Alka-Seltzer, Maalox, Mylanta, Pepto-Bismol, Rolaids, and Riopan, are usually the first drugs recommended to relieve heartburn and other mild GERD symptoms. Many brands on the market use different combinations of three basic salts-magnesium, calcium, and aluminum-with hydroxide or bicarbonate ions to neutralize the acid in your stomach. Antacids, however, have side effects. Magnesium salt can lead to diarrhea, and aluminum salts can cause constipation. Aluminum and magnesium salts are often combined in a single product to balance these effects. Calcium carbonate antacids, such as Tums, Titralac, and Alka-2, can also be a supplemental source of calcium. They can cause constipation as well.     Foaming agents, such as Gaviscon, work by Barista contents with foam to prevent reflux. These drugs may help those who have no  damage to the esophagus.    H2 blockers, such as cimetidine (Tagamet HB), famotidine (Pepcid AC), nizatidine (Axid AR), and ranitidine (Zantac 75), impede acid production. They are available in prescription strength and over the counter. These drugs provide short-term relief, but over-the-counter H2 blockers should not be used for more than a few weeks at a time. They are effective for about half of those who have GERD symptoms. Many people benefit from taking H2 blockers at bedtime in combination with a proton pump inhibitor.     Proton pump inhibitors include omeprazole (Prilosec), lansoprazole (Prevacid), pantoprazole (Protonix), rabeprazole (Aciphex), and esomeprazole (Nexium), which are all available by prescription. Proton pump inhibitors are more effective than H2 blockers and can relieve symptoms in almost everyone who has GERD.    What if your symptoms persist?  If your heartburn does not improve with lifestyle changes or drugs, you may need additional tests.   A barium swallow radiograph uses x rays to help spot abnormalities such as a hiatal hernia and severe inflammation of the esophagus. With this test, you drink a solution and then x rays are taken. Mild irritation will not appear on this test, although narrowing of the esophagus-called stricture-ulcers, hiatal hernia, and other problems will.    Upper endoscopy is more accurate than a barium swallow radiograph and may be performed in a hospital or a doctor's office. The doctor will spray your throat to numb it and slide down a thin, flexible plastic tube called an endoscope. A tiny camera in the endoscope allows the doctor to see the surface of the esophagus and to search for abnormalities. If you have had moderate to severe symptoms and this procedure reveals injury to the esophagus, usually no other tests are needed to confirm GERD.  The doctor may use tiny tweezers (forceps) in the endoscope to remove a small piece of tissue for biopsy. A  biopsy viewed under a microscope can reveal damage caused by acid reflux and rule out other problems if no infecting organisms or abnormal growths are found.    In an ambulatory pH monitoring examination, the doctor puts a tiny tube into the esophagus that will stay there for 24 hours. While you go about your normal activities, it measures when and how much acid comes up into your esophagus. This test is useful in people with GERD symptoms but no esophageal damage. The procedure is also helpful in detecting whether respiratory symptoms, including wheezing and coughing, are triggered by reflux.  Surgery is an option when medicine and lifestyle changes do not work. Surgery may also be a reasonable alternative to a lifetime of drugs and discomfort.    What are the long-term complications of GERD?  Sometimes GERD can cause serious complications. Inflammation of the esophagus from stomach acid causes bleeding or ulcers. In addition, scars from tissue damage can narrow the esophagus and make swallowing difficult. Some people develop Barrett's esophagus, where cells in the esophageal lining take on an abnormal shape and color, which over time can lead to cancer.  Also, studies have shown that asthma, chronic cough, and pulmonary fibrosis may be aggravated or even caused by GERD.

## 2014-11-28 ENCOUNTER — Encounter: Payer: Self-pay | Admitting: Family Medicine

## 2014-11-28 ENCOUNTER — Telehealth: Payer: Self-pay | Admitting: Family Medicine

## 2014-11-28 ENCOUNTER — Ambulatory Visit: Payer: Commercial Managed Care - HMO | Admitting: Family Medicine

## 2014-11-28 VITALS — BP 135/70 | HR 77 | Temp 98.3°F | Resp 12 | Wt 139.3 lb

## 2014-11-28 DIAGNOSIS — K644 Residual hemorrhoidal skin tags: Principal | ICD-10-CM

## 2014-11-28 DIAGNOSIS — K648 Other hemorrhoids: Secondary | ICD-10-CM

## 2014-11-28 MED ORDER — HYDROCORTISONE 1 %-PRAMOXINE 1 % RECTAL FOAM
1.0000 | Freq: Two times a day (BID) | RECTAL | 0 refills | Status: AC
Start: 2014-11-28 — End: 2015-11-23
  Filled 2014-11-28: qty 10, 30d supply, fill #0

## 2014-11-28 NOTE — Patient Instructions (Signed)
If bleeding does not stop within the next 3 days, please return.

## 2014-11-28 NOTE — Nursing Note (Signed)
vital signs taken, allergies verified,screened for pain,and medication history taken.  Bryant Lester, MAII

## 2014-11-28 NOTE — Telephone Encounter (Signed)
Marisa Jenkins is a 59yr old female  3 patient identifiers used.  Per:   patient  Reason for Call:  Hemorrhoids/ blood in stool  Symptoms:    Patient states she tried over the counter medications preparation H, tucks and sitz baths and not helping her hemorrhoids since yesterday. Patient states she has pain and swelling. Patient states she feels bloated and gassy. Patient states she noticed bright red blood in her stool and on the toilet tissue. Patient states she noticed a dark red clot in the toilet water. Patient states she had a colonoscopy and endoscopy on Friday. Offered appointment with another provider and patient accepted.    Homecare and/or Medications given:  Observation,preparation H  Advice:  Appointment made for today for evaluation    Pain: yes  Pain location and 1-10:  rectum         Disposition: Appointment given per [facility] hemmorhoids protocol. Appointment at 1300 with MD Valetta Close.   Per:   patient verbalizes agreement to plan. Agrees to callback with any increase in symptoms/concerns or questions.     Charlotte Sanes RN  PCN Triage

## 2014-11-28 NOTE — Progress Notes (Signed)
Chief Complaint   Patient presents with    Hemorrhoids     had colonoscopy recently    Blood In Stool     SUBJECTIVE:   Marisa Jenkins is a 59yr old female presents for hemorrhoids. Having pain and blood since yesterday. Tried tucks and prep H. Had colonoscopy Friday. Also endoscopy. Now taking Prilosec for ulcers.    Also has small raised bump on the right index finger and ridging of the nail on the same finger. Thinks bump causing ridging. Present at least two months.    History:  I did review patient's past medical and family/social history, no changes noted.    Problem List:  Patient Active Problem List   Diagnosis    Hyperlipidemia    Postmenopausal HRT (hormone replacement therapy)    Migraine headache    Allergic rhinitis    Anxiety disorder    Impaired fasting glucose    Pain in joint of right hand    Internal hemorrhoids    Allergic rhinitis due to pollen    Cerumen impaction    Injury of right foot    Libido, decreased    Pain, abdominal    Tubular adenoma    Polyp of colon       Fam Hx:  family history includes Cancer in her maternal grandmother; Genetic in her father; Heart in her brother and father; Lipids in her father; motor vehicle accident in her mother; osteoporosis in her paternal grandmother.    Social Hx:   reports that she has quit smoking. She has never used smokeless tobacco. She reports that she drinks alcohol. She reports that she does not use illicit drugs.    Medications:  Current Outpatient Prescriptions   Medication Sig    Aspirin 81 mg DR Tablet EC Tablet Take 1 tablet by mouth every day.    Atorvastatin (LIPITOR) 40 mg tablet Take 1 tablet by mouth every day for 360 days.    Butalbital-Aspirin-Caffeine [facility]) 50-325-40 mg per capsule Take 1 capsule by mouth 4 times daily if needed for pain or headache. May repeat dose in 4 hours if response not obtained (not to exceed 6 caps per day)    Cetirizine (ZYRTEC) 10 mg Tablet Take 1 tablet by mouth once daily if needed  for Allergies.    Estradiol-Norethindrone Acet (COMBIPATCH) 0.05-0.14 mg/24 hr Semiweekly Patch Apply 1 patch to the skin every Monday and Thursday.    Omeprazole (PRILOSEC) 20 mg Delayed Release Capsule Take 1 capsule by mouth 2 times daily before meals for acid.    PEG 3350-Electrolytes (GAVILYTE-C) 240-22.72-6.72 -5.84 gram Reconstitute Solution Take as directed     No current facility-administered medications for this visit.      ROS:   Constitutional: negative.  Eyes: negative.  Ears, Nose, Mouth, Throat: negative.  CV: negative.  Resp: negative.  GI: negative.  TI:[id number], pain and bleeding from rectum  Musculoskeletal: negative.  Integumentary: bump on finger  Neuropsych: negative.    Objective:    BP 135/70  Pulse 77  Temp 36.8 C (98.3 F) (Oral)  Resp 12  Wt 63.2 kg (139 lb 5.3 oz)  LMP 10/28/2010  BMI 23.19 kg/m2    Physical Exam   Constitutional:   The patient is well developed, well nourished, alert, pleasant and cooperative.   Rectal: there is a small non inflamed tag hemorrhoid external at 10'Oclock. The anoscope is inserted to 4 cm without difficulty. No enlarged or inflamed hemorrhoidal veins noted. Did get some  dark blood on the tip of the anoscope.  Musculoskeletal:   There is a 2 mm soft, slightly erythematous, non tender raised area proximal to the nail bed of the right index finger. The adjacent nail is ridged vertically. no pigment change on nail.  Skin:   The skin is warm and dry without rashes or lesions.   Psychiatric:   The mood and affect are appropriate. The speech is normal cadence. The thought processes appear normal.     Assessment/Plan:   (K64.8) External hemorrhoids  (primary encounter diagnosis)  Comment: appears the problem is mostly treated already. Will give stronger med to help resolve symptoms.  Plan: proctofoam HC    Advised watch bump on finger. Does not appear concerning.     See Diagnoses/Orders Section.    Barriers to Learning assessed: none. Patient verbalizes  understanding of teaching and instructions.    [place], DO

## 2014-11-29 ENCOUNTER — Encounter: Payer: Self-pay | Admitting: Family Medicine

## 2014-11-29 ENCOUNTER — Other Ambulatory Visit: Payer: Self-pay

## 2014-11-29 NOTE — Telephone Encounter (Signed)
From: [name]  To: [name], MD  Sent: 11/29/2014 10:52 AM PDT  Subject: Non-urgent Medical Advice Question    Good morning,    With the endoscopy revealing I have an ulcer, I didn't know if the Fiorinal prescription should be changed to include Tylenol in lieu of aspirin? Also, do you still recommend that I take a baby aspirin daily?

## 2014-12-05 ENCOUNTER — Ambulatory Visit: Payer: Commercial Managed Care - HMO

## 2014-12-05 DIAGNOSIS — J301 Allergic rhinitis due to pollen: Secondary | ICD-10-CM

## 2014-12-05 NOTE — Nursing Note (Signed)
After 30 minutes observation, Patient's injection sites were checked for any local reaction. Patient then left the clinic feeling fine.  Local reaction was 0 (wheal/erythema).  Marshaun Lortie C Tyneshia Stivers, RN

## 2014-12-05 NOTE — Nursing Note (Signed)
Patient here for Immunotherapy.  Patient and antigen were identified with 2 identifiers, including patient's confirmation of correct antigen bottle(s).  Patient's condition was then assessed, which included any immediate or delayed reaction to previous dose, current health status, Peak flow reading if indicated, use of any new meds such as antibiotic or Beta Blocker, if antihistamine had been taken within the previous 24 hours, and length of time since last allergy injection(s).    The allergy injection(s) were then administered per the build up schedule.  Patients dose was repeated.(reduced, repeated, advanced).   Patient received 0.50 of 1:1 Aq Tree/Grass/Weed subcutaneous in left arm .( dose, concentration, name of antigen).  Estelita Iten C Lisaann Atha, RN

## 2014-12-06 LAB — SURGICAL PATHOLOGY

## 2014-12-12 ENCOUNTER — Encounter: Payer: Self-pay | Admitting: Gastroenterology

## 2014-12-12 ENCOUNTER — Ambulatory Visit: Payer: Commercial Managed Care - HMO | Admitting: Gastroenterology

## 2014-12-12 DIAGNOSIS — K621 Rectal polyp: Secondary | ICD-10-CM

## 2014-12-12 DIAGNOSIS — Z23 Encounter for immunization: Secondary | ICD-10-CM

## 2014-12-12 DIAGNOSIS — D012 Carcinoma in situ of rectum: Secondary | ICD-10-CM

## 2014-12-12 NOTE — Nursing Note (Signed)
Vital signs taken, allergies verified, screened for pain, med hx taken,  screened for chicken pox, and verified immunization status.  Stefanie Murphy, MA

## 2014-12-12 NOTE — Nursing Note (Signed)
The Influenza Vaccine VIS document for the flu injection was given to patient to review. Patient or person named in permission has answered no to any history of egg allergy, previous serious reaction to a influenza vaccine or current illness which would preclude them receiving an immunization. Any questions were referred to the physician. The Influenza Vaccine was then administered per protocol. The patient was observed for immediate reactions to the vaccine per protocol. None were observed.   Stefanie Murphy

## 2014-12-12 NOTE — Progress Notes (Signed)
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     HPI:  This is a 59-yr old Caucasian female with a history of adenomatous colon polyps (last in 2013 at [place] where 3 subcentimeter adenomatous polyps were found) who presented for her 3rd colonoscopy for surveillance of adenomas in her colon on 11/24/14.   A 1 cm pedunculated rectal polyp was seen and removed by snare cautery followed by 1 hemoclip placement at polypectomy site.     B.  RECTUM, POLYP (BIOPSY):       -  At least intramucosal carcinoma arising in a tubular adenoma (see comment)        COMMENT    (B) Rectal polyp: Sections demonstrate a tubular adenoma. At one edge of the adenoma, marked cytologic atypia is present along with a prominent pseudo-papillary pattern (which appears to dive deeply into the mucosa of the polyp) and focal cribriform architecture. The findings are interpreted as at least intramucosal    carcinoma (pTis). The margins of the lesion cannot be assessed due to the angle of sectioning. Repeat endoscopy, with consideration of ultrasound guidance, is suggested for further evaluation.        The neoplastic proliferation is diffusely positive for CDX2 and negative for PAX8, supporting lower GI origin. Additional step level and serial sections were reviewed.        REVIEW/CONCUR: Dr. Macarthur Critchley has reviewed part B1 (level 2 of 3 and serial 4 of 5) and concurs with the above interpretation.     She states her brother has had partial colectomy for removal of a colon polyp. She has 4 other siblings.   She has 3 children in their 9s.     Work-up so far:  06/05/11  MICROSCOPIC DIAGNOSIS:  A. SIGMOID COLON, POLYPECTOMY (2 FRAGMENTS):  BENIGN SERRATED POLYP, FAVOR BENIGN HYPERPLASTIC POLYP.    B. DISTAL SIGMOID COLON, POLYPECTOMY:  1. BENIGN TUBULAR ADENOMA.  2. RIMMED BY BENIGN COLONIC  MUCOSA IN PLANE OF SECTION.    C. SIGMOID COLON, POLYPECTOMY (2 FRAGMENTS):  TWO FRAGMENTS OF BENIGN TUBULAR ADENOMA WITH MODERATE SURFACE ATYPIA,  INFLAMED.      05/19/06  DIAGNOSIS:  POLYP, TRANSVERSE: TUBULAR ADENOMA.    Received in Prefer fixative in a container, labeled with the patient's name and "polyp transverse," is a 0.2 cm irregular pale tan gray fragment of soft tissue, which is submitted in toto in one cassette.    EGD 11/24/14   To evaluate LUQ pain   Shallow antral ulcers found. Biopsies showed no evidence of H Pylori.   She is on prilosec BID to help heal these  Pt is talking to PCP about avoid NSAIDs.      Medications:  GI Medication             Omeprazole (PRILOSEC) 20 mg Delayed Release Capsule Take 1 capsule by mouth 2 times daily before meals for acid.    PEG 3350-Electrolytes (GAVILYTE-C) 240-22.72-6.72 -5.84 gram Reconstitute Solution Take as directed          Current symptoms:  None today     ROS:  Constitutional: negative.  Eyes: negative.  Ears, Nose, Mouth, Throat: negative.  CV: negative.  Resp: negative.  GI: negative.  GU: negative.  Musculoskeletal: negative.  Integumentary: negative.  Neuro: negative.  Psych: Mood pt's report, euthymic.  Endo: negative.  Heme/Lymphatic: negative.  Allergy/Immun:  negative.  GYN: negative.    History:  I did review patient's past medical and family/social history, no changes noted.    Physical exam:  BP 149/89  Pulse 88  Temp 36.6 C (97.8 F) (Temporal)  Resp 18  LMP 10/28/2010  General Appearance: healthy, alert, no distress, pleasant affect, cooperative  Eye exam:conjunctivae and corneas clear. PERRL, EOM's intact. sclerae normal  Oropharynx:normal  Neck:Neck supple. No adenopathy, thyroid symmetric, normal size  Cardiovascular:normal rate and regular rhythm, no murmurs, clicks, or gallops  Lungs:clear to auscultation  Abdomen:abdomen is soft without significant tenderness, masses, organomegaly or guarding  Extremities:no cyanosis, clubbing, or  edema  Skin:Skin color, texture, turgor normal. 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   - will discuss with EUS Faculty about need/role for EUS evaluation of rectum to evaluate for deeper spread/evidence of completeness of resection   - will also obtain a surgery consultation   - she will continue to need surveillance in future  - she will discuss with family members about need for colon cancer screening  - her children should start screening at age 46    RTC: 3 month(s)     Patient barriers to learning: none    Pt/family understanding: verbalizes    Electronically signed by:  Harley Alto, MD, [facility]  Assistant Clinical Professor

## 2014-12-18 ENCOUNTER — Encounter: Payer: Self-pay | Admitting: Family Medicine

## 2014-12-18 NOTE — Telephone Encounter (Signed)
This is a duplicate request for medication.  This medication was refilled on 10/05/14 for #8 w/11 refills .  A MyChart message has been sent to patient advising her to activate refill through pharmacy.  Evie Lacks, MA1

## 2014-12-18 NOTE — Telephone Encounter (Signed)
From: [name]  To: Harley Alto, MD  Sent: 12/18/2014 4:06 PM PDT  Subject: Visit Follow-up Question    Just checking in to see what next steps that you are recommending regarding the results from my colonoscopy?

## 2014-12-20 ENCOUNTER — Telehealth: Payer: Self-pay | Admitting: Gastroenterology

## 2014-12-20 MED ORDER — PEG 3350-ELECTROLYTES 236 GRAM-22.74 GRAM-6.74 GRAM-5.86 GRAM SOLUTION
4.0000 L | Freq: Once | ORAL | 0 refills | Status: AC
  Filled 2014-12-20: fill #0

## 2014-12-20 MED ORDER — ESTRADIOL 0.05 MG-NORETHINDRONE 0.14 MG/24 HR SEMIWKLY TRANSDERM PATCH
1.0000 | MEDICATED_PATCH | TRANSDERMAL | 11 refills | Status: DC
Start: 2014-12-21 — End: 2015-07-19

## 2014-12-20 NOTE — Telephone Encounter (Signed)
Pertinent parts of chart reviewed. Pt with recent completed EGD and colonoscopy done on 11/14/14. Message routed to Dr. Arbutus Leas. Renard Matter, RN

## 2014-12-20 NOTE — Telephone Encounter (Signed)
Called pt to discuss plan for sigmoidoscopy.   I do not do procedures at [facility], hence I suggested she come to [facility] in Dec 2016 for her sigmoidoscopy. She is agreeable.     Sincerely,     Electronically signed by:  Harley Alto, MD, [facility]  Assistant Clinical Professor

## 2014-12-20 NOTE — Addendum Note (Signed)
Addended byHarley Alto on: 12/20/2014 12:56 PM     Modules accepted: Orders

## 2014-12-20 NOTE — Telephone Encounter (Signed)
Called pt to discuss plan for sigmoidoscopy.   I do not do procedures at [facility], hence I suggested she come to [facility] in Dec 2016 for her sigmoidoscopy. She is agreeable.     Sincerely,     Electronically signed by:  Harley Alto, MD, [facility]  Assistant Clinical Professor

## 2014-12-20 NOTE — Telephone Encounter (Signed)
Patient called to get an appointment for a colonoscopy, however she was scheduled In the [place] office. Patient is requesting to [place]. i called to to speak with someone because patient told me she was told she can just call and get the appointment. i spoke to [facility] at [phone] in the [facility] office and she is going to redirect the referral.     Thank You  [place]  [phone]  [phone]

## 2014-12-22 ENCOUNTER — Other Ambulatory Visit: Payer: Self-pay | Admitting: Gastroenterology

## 2014-12-22 ENCOUNTER — Ambulatory Visit: Payer: Commercial Managed Care - HMO

## 2014-12-22 DIAGNOSIS — D128 Benign neoplasm of rectum: Principal | ICD-10-CM

## 2014-12-22 DIAGNOSIS — J301 Allergic rhinitis due to pollen: Secondary | ICD-10-CM

## 2014-12-22 MED ORDER — PEG 3350-ELECTROLYTES 236 GRAM-22.74 GRAM-6.74 GRAM-5.86 GRAM SOLUTION
4.0000 L | Freq: Once | ORAL | 0 refills | Status: AC
  Filled 2014-12-22: fill #0

## 2014-12-22 NOTE — Nursing Note (Signed)
Patient here for Immunotherapy.  Patient and antigen were identified with 2 identifiers, including patient's confirmation of correct antigen bottle(s).  Patient's condition was then assessed, which included any immediate or delayed reaction to previous dose, current health status, Peak flow reading if indicated, use of any new meds such as antibiotic or Beta Blocker, if antihistamine had been taken within the previous 24 hours, and length of time since last allergy injection(s).    The allergy injection(s) were then administered per the build up schedule.  Patients dose was repeatedd.(reduced, repeated, advanced).   Patient received 0..50 of 1:1 Aq Tree/Grass/Weed subcutaneous in right arm .( dose, concentration, name of antigen).  Suzan Slick, RN      After 30 minutes observation, Patient's injection sites were checked for any local reaction. Patient then left the clinic feeling fine.  Local reaction was 0 (wheal/erythema).  Suzan Slick, RN

## 2015-01-04 ENCOUNTER — Encounter: Payer: Self-pay | Admitting: Family Medicine

## 2015-01-04 DIAGNOSIS — E7800 Pure hypercholesterolemia, unspecified: Secondary | ICD-10-CM

## 2015-01-04 DIAGNOSIS — R7301 Impaired fasting glucose: Principal | ICD-10-CM

## 2015-01-04 NOTE — Telephone Encounter (Signed)
From: [name]  To: [name], MD  Sent: 01/04/2015 12:30 PM PDT  Subject: Preventive Care    I believe it has been 6 months since I had my last lipid panel screening. Could you please submit for the blood work?

## 2015-01-22 ENCOUNTER — Encounter: Payer: Self-pay | Admitting: Family Medicine

## 2015-01-24 ENCOUNTER — Other Ambulatory Visit: Payer: Self-pay

## 2015-01-24 MED ORDER — DIAZEPAM 5 MG TABLET
5.0000 mg | ORAL_TABLET | Freq: Four times a day (QID) | ORAL | 0 refills | Status: DC | PRN
  Filled 2015-01-24: qty 30, 8d supply, fill #0

## 2015-01-24 NOTE — Telephone Encounter (Signed)
Faxed. Marisa Ardolino, MA I

## 2015-01-24 NOTE — RN Handoff (Signed)
Prescription printed, signed and ready to fax to pharmacy. Brent Taillon, MD

## 2015-01-25 ENCOUNTER — Other Ambulatory Visit: Payer: Self-pay | Admitting: Family Medicine

## 2015-01-25 ENCOUNTER — Encounter: Payer: Self-pay | Admitting: Family Medicine

## 2015-01-25 ENCOUNTER — Ambulatory Visit: Payer: Commercial Managed Care - HMO | Attending: Family Medicine

## 2015-01-25 ENCOUNTER — Ambulatory Visit (INDEPENDENT_AMBULATORY_CARE_PROVIDER_SITE_OTHER): Payer: Commercial Managed Care - HMO

## 2015-01-25 DIAGNOSIS — R7301 Impaired fasting glucose: Principal | ICD-10-CM | POA: Insufficient documentation

## 2015-01-25 DIAGNOSIS — R748 Abnormal levels of other serum enzymes: Secondary | ICD-10-CM | POA: Insufficient documentation

## 2015-01-25 DIAGNOSIS — J301 Allergic rhinitis due to pollen: Secondary | ICD-10-CM | POA: Insufficient documentation

## 2015-01-25 DIAGNOSIS — E7800 Pure hypercholesterolemia, unspecified: Secondary | ICD-10-CM | POA: Insufficient documentation

## 2015-01-25 LAB — COMPREHENSIVE METABOLIC PANEL
ALANINE TRANSFERASE (ALT): 70 U/L — AB (ref 5–54)
ALBUMIN: 4.7 g/dL (ref 3.4–4.8)
ALKALINE PHOSPHATASE (ALP): 77 U/L (ref 35–115)
ASPARTATE TRANSAMINASE (AST): 54 U/L — AB (ref 15–43)
BILIRUBIN TOTAL: 0.5 mg/dL (ref 0.3–1.3)
CALCIUM: 9.5 mg/dL (ref 8.6–10.5)
CARBON DIOXIDE TOTAL: 25 meq/L (ref 24–32)
CHLORIDE: 103 meq/L (ref 95–110)
CREATININE BLOOD: 0.66 mg/dL (ref 0.44–1.27)
GLUCOSE: 115 mg/dL — AB (ref 70–99)
POTASSIUM: 4.5 meq/L (ref 3.3–5.0)
PROTEIN: 7.5 g/dL (ref 6.3–8.3)
SODIUM: 138 meq/L (ref 135–145)
UREA NITROGEN, BLOOD (BUN): 20 mg/dL (ref 8–22)

## 2015-01-25 LAB — LIPID PANEL
CHOLESTEROL: 211 mg/dL — AB (ref 0–200)
HDL CHOLESTEROL: 67 mg/dL (ref >=35)
LDL CHOLESTEROL CALCULATION: 127 mg/dL (ref <130)
NON-HDL CHOLESTEROL: 144 mg/dL (ref 0–150)
TOTAL CHOLESTEROL:HDL RATIO: 3.2 (ref <4.0)
TRIGLYCERIDE: 85 mg/dL (ref 35–160)

## 2015-01-25 LAB — HEMOGLOBIN A1C
HGB A1C,GLUCOSE EST AVG: 117 mg/dL
HGB A1C: 5.7 % — AB (ref 3.9–5.6)

## 2015-01-25 NOTE — Nursing Note (Signed)
Patient here for Immunotherapy.  Patient and antigen were identified with 2 identifiers, including patient's confirmation of correct antigen bottle(s).  Patient's condition was then assessed, which included any immediate or delayed reaction to previous dose, current health status, Peak flow reading if indicated, use of any new meds such as antibiotic or Beta Blocker, if antihistamine had been taken within the previous 24 hours, and length of time since last allergy injection(s).    The allergy injection(s) were then administered per the build up schedule.  Patients dose was decreased as it has been 5 weeks since last IT.(reduced, repeated, advanced).   Patient received 0.40 of 1:1 Aq Tree/Grass/Weed subcutaneous in left arm .( dose, concentration, name of antigen).  Suzan Slick, RN      After 30 minutes observation, Patient's injection sites were checked for any local reaction. Patient then left the clinic feeling fine.  Local reaction was 0 (wheal/erythema).  Suzan Slick, RN

## 2015-02-07 ENCOUNTER — Ambulatory Visit: Payer: Commercial Managed Care - HMO

## 2015-02-07 ENCOUNTER — Encounter: Payer: Self-pay | Admitting: Family Medicine

## 2015-02-07 ENCOUNTER — Other Ambulatory Visit: Payer: Self-pay

## 2015-02-07 DIAGNOSIS — J301 Allergic rhinitis due to pollen: Secondary | ICD-10-CM

## 2015-02-07 NOTE — Nursing Note (Signed)
Patient here for Immunotherapy.  Patient and antigen were identified with 2 identifiers, including patient's confirmation of correct antigen bottle(s).  Patient's condition was then assessed, which included any immediate or delayed reaction to previous dose, current health status, Peak flow reading if indicated, use of any new meds such as antibiotic or Beta Blocker, if antihistamine had been taken within the previous 24 hours, and length of time since last allergy injection(s).    The allergy injection(s) were then administered per the build up schedule.  Patients dose was advanced.(reduced, repeated, advanced).   Patient received 0.50 of 1:1 Aq Tree/Grass/Weed subcutaneous in right arm .( dose, concentration, name of antigen).  Teshara Moree C Issabela Lesko, RN

## 2015-02-07 NOTE — Nursing Note (Signed)
After 30 minutes observation, Patient's injection sites were checked for any local reaction. Patient then left the clinic feeling fine.  Local reaction was 0/24 (wheal/erythema).  Halah Whiteside C Corabelle Spackman, RN

## 2015-02-08 ENCOUNTER — Ambulatory Visit: Payer: Self-pay

## 2015-02-22 ENCOUNTER — Ambulatory Visit: Payer: Commercial Managed Care - HMO

## 2015-02-22 DIAGNOSIS — J301 Allergic rhinitis due to pollen: Secondary | ICD-10-CM

## 2015-02-22 NOTE — Nursing Note (Signed)
Patient here for Immunotherapy.  Patient and antigen were identified with 2 identifiers, including patient's confirmation of correct antigen bottle(s).  Patient's condition was then assessed, which included any immediate or delayed reaction to previous dose, current health status, Peak flow reading if indicated, use of any new meds such as antibiotic or Beta Blocker, if antihistamine had been taken within the previous 24 hours, and length of time since last allergy injection(s).    The allergy injection(s) were then administered per the build up schedule.  Patients dose was repeated.(reduced, repeated, advanced).   Patient received 0.50 of 1:1 Aq Tree/Grass/Weed subcutaneous in left arm .( dose, concentration, name of antigen).  Renelle Stegenga C Anjalee Cope, RN      After 30 minutes observation, Patient's injection sites were checked for any local reaction. Patient then left the clinic feeling fine.  Local reaction was 0 (wheal/erythema).  Lynzee Lindquist C Erendira Crabtree, RN

## 2015-03-01 ENCOUNTER — Other Ambulatory Visit: Payer: Self-pay

## 2015-03-07 ENCOUNTER — Telehealth: Payer: Self-pay | Admitting: Gastroenterology

## 2015-03-07 ENCOUNTER — Encounter: Payer: Self-pay | Admitting: Gastroenterology

## 2015-03-07 NOTE — Telephone Encounter (Signed)
From: [name]  To: Harley Alto, MD  Sent: 03/07/2015 3:02 PM PST  Subject: Visit Follow-up Question    Is there anything I need to drink for my sigmoidoscopy on Friday, 1/6?

## 2015-03-07 NOTE — Telephone Encounter (Signed)
Unable to reach patient. Message left thru voicemail to confirm her upcoming procedure on 03/15/2014 @ 900am  and to call back if she has any question.

## 2015-03-13 ENCOUNTER — Other Ambulatory Visit: Payer: Self-pay

## 2015-03-13 ENCOUNTER — Encounter: Payer: Self-pay | Admitting: FAMILY PRACTICE

## 2015-03-13 ENCOUNTER — Ambulatory Visit: Payer: Commercial Managed Care - HMO | Admitting: FAMILY PRACTICE

## 2015-03-13 VITALS — BP 137/80 | HR 99 | Temp 98.0°F | Resp 12 | Wt 139.1 lb

## 2015-03-13 DIAGNOSIS — B351 Tinea unguium: Secondary | ICD-10-CM

## 2015-03-13 DIAGNOSIS — L03012 Cellulitis of left finger: Principal | ICD-10-CM

## 2015-03-13 DIAGNOSIS — Q846 Other congenital malformations of nails: Secondary | ICD-10-CM

## 2015-03-13 MED ORDER — SULFAMETHOXAZOLE 800 MG-TRIMETHOPRIM 160 MG TABLET
1.0000 | ORAL_TABLET | Freq: Two times a day (BID) | ORAL | 0 refills | Status: AC
  Filled 2015-03-13: qty 20, 10d supply, fill #0

## 2015-03-13 MED ORDER — SODIUM PHOSPHATES 19 GRAM-7 GRAM/118 ML ENEMA
1.0000 | ENEMA | Freq: Once | RECTAL | 0 refills | Status: AC | PRN
Start: 2015-03-16 — End: 2015-03-17
  Filled 2015-03-13: fill #0

## 2015-03-13 NOTE — Nursing Note (Signed)
Vitals taken, allergies verified, screened for pain. Alvera Tourigny, MA1

## 2015-03-13 NOTE — Progress Notes (Signed)
Subjective:  Marisa Jenkins is a 60yr old female who is here for the following concern(s): Infection (left toe and right middle finger)    Erythematous left 3rd toe for the last few days, worse with time, has tried warm compresses without improvement. Denies fevers.     Abnormality of the 3rd right nail bed, there is a lump there, does not hurt, persistent for months but not worsening.     Thickened nails of the feet for years.     Review of Systems:  Denies fever, chills, chest pain, shortness of breath, nausea, vomiting, diarrhea.       I reviewed the patient's active problem list in the EMR and have reviewed and confirmed current medications in medical record. Reviewed tobacco: reports that she has quit smoking. She has never used smokeless tobacco.     Objective:  BP 137/80  Pulse 99  Temp 36.7 C (98 F) (Temporal)  Resp 12  Wt 63.1 kg (139 lb 1.8 oz)  LMP 10/28/2010  BMI 23.15 kg/m2  General Appearance: healthy, alert, no distress, pleasant affect, cooperative  Neck: supple  Extremities: no cyanosis, clubbing, or edema  Skin:    Third digit, hand, right: sub q nodule at the nail bed with disruption of the normal nail   Third digit, foot, left: erythematous with pus at the nail bed    Nails: thickened nails on most with flaky, demonstrating onycholysis, and dystrophy; pain not present  Neuro: gait normal  Mental Status: seems euthymic    Assessment/Plan:  1. Paronychia, left  New to me. Differential diagnosis discussed. Needs prescription medication to manage symptoms/disease process. Risks discussed. Precautions reviewed.     - Trimethoprim 160 mg/Sulfamethoxazole 800 mg (BACTRIM DS) Tablet; Take 1 tablet by mouth 2 times daily.  Dispense: 20 tablet; Refill: 0    2. Onychomycosis  Needs further workup.   - DERMATOLOGY PATHOLOGY; Future    3. Abnormality of nail tissue  Uncertain etiology, given growth will send for referral. Recommend referral for further evaluation by Dermatology.     - DERMATOLOGY CLINIC  REFERRAL    Discussed with the patient and all questions fully answered. She will call me if any problems arise.     Barriers to Learning assessed: none. Patient and/or caregiver verbalizes and return demos understanding of teaching and instructions. The patient and/or caregiver was educated regarding his/her medical care. No guarantees were made regarding the patient's medical care or treatment outcome.    Follow-up made as appropriate.     electronically signed by:   Azucena Fallen, MD  Associate Physician  [facility], [facility]es

## 2015-03-13 NOTE — Patient Instructions (Signed)
A referral has been placed for DERMATOLOGY.  Please allow 14 business days for your referral to be processed.  If you do not receive a call from DERMATOLOGY in 15 business days, please call them directly at [phone].

## 2015-03-16 ENCOUNTER — Other Ambulatory Visit: Payer: Self-pay | Admitting: Rheumatology

## 2015-03-16 ENCOUNTER — Encounter: Payer: Self-pay | Admitting: Gastroenterology

## 2015-03-16 ENCOUNTER — Encounter: Payer: Self-pay | Admitting: Family Medicine

## 2015-03-16 ENCOUNTER — Ambulatory Visit: Payer: Commercial Managed Care - HMO | Admitting: Gastroenterology

## 2015-03-16 VITALS — BP 133/78 | HR 85 | Temp 97.3°F | Resp 18 | Ht 65.0 in | Wt 137.1 lb

## 2015-03-16 DIAGNOSIS — J301 Allergic rhinitis due to pollen: Principal | ICD-10-CM

## 2015-03-16 DIAGNOSIS — C189 Malignant neoplasm of colon, unspecified: Secondary | ICD-10-CM | POA: Insufficient documentation

## 2015-03-16 DIAGNOSIS — Z85038 Personal history of other malignant neoplasm of large intestine: Principal | ICD-10-CM

## 2015-03-16 HISTORY — PX: FLEXIBLE SIGMOIDOSCOPY: GILAB00006

## 2015-03-16 HISTORY — PX: PR SIGMOIDOSCOPY,BIOPSY: 45331

## 2015-03-16 LAB — DERMATOLOGY PATHOLOGY

## 2015-03-16 MED ORDER — PEG 3350 240 GRAM-ELECTROLYTES 22.72 GRAM-6.72 G-5.84 G POWDR FOR SOLN
ORAL | 0 refills | Status: AC
  Filled 2015-03-16: fill #0
  Filled 2015-06-11: qty 4000, 1d supply, fill #0

## 2015-03-16 MED ORDER — SODIUM CHLORIDE 0.9 % INTRAVENOUS SOLUTION
INTRAVENOUS | 0 refills | Status: DC
Start: 2015-03-16 — End: 2015-03-16

## 2015-03-16 NOTE — Progress Notes (Signed)
Pt's antigen expires in February.  Suzan Slick, RN

## 2015-03-16 NOTE — Procedures (Signed)
Patient: Marisa Jenkins  Location: [facility]    Medical record number: [id number]  Sex: female  Age: 70yr(s)  Date of birth: 11/29/1955    Date of Service: 03/16/2015    GI Pre-procedure Indications:  Previous rectal pedunculated adenomatous polyp with intramucosal cancer (Sep 2016)    Procedure: Sigmoidoscopy  Subprocedure:None     Referring Physician: PCP: [name], MD    PERFORMING SURGEON: Harley Alto, MD I performed the entire procedure    Endoscope insertion time: 9:31  Endoscope withdrawal time: 9:44    Extent of examination: sigmoid  Boston Bowel Preparation Score: 3 (after cleaning up)      Details of the Procedure:    Informed consent was obtained for the procedure, including sedation.  Risks of perforation, hemorrhage, adverse drug reaction, pain, infection, missed lesion(s), incomplete examination, and aspiration were discussed.  A timeout was performed by nursing and medical staff to identify the patient (using two patient identifiers) and appropriate procedure. The patient was placed in the left lateral decubitus position. The patient was monitored continuously with EKG tracing, pulse oximetry, blood pressure monitoring, and direct observations.      A rectal examination was performed.  The Olympus upper endoscope was inserted into the rectum and advanced under direct vision to the sigmoid colon.  The quality of the colonic preparation was good.  A careful inspection was made as the colonscope was withdrawn, including a retroflexed view of the rectum; findings and interventions are described below.  Photo documentation was obtained.  The patient tolerance of the procedure was good, and there were no complications.  She was taken to the recovery area in stable condition.      Sedation:  Sedation was not administered for the procedure    Findings and Interventions:    Sigmoid colon: normal  Rectum: normal    Impression:  Previous polypectomy site in rectum was not identified despite  multiple passes including retroflexed view in proximal rectosigmoid region.       Recommendation/Plan:  Repeat colonoscopy as planned this year in view of cancerous polyp last year  High fiber diet  Low red meat         Harley Alto, MD  Attending Physician

## 2015-03-16 NOTE — Progress Notes (Signed)
GASTROENTEROLOGY/HEPATOLOGY PRE-PROCEDURE HISTORY & PHYSICAL    Date of Service: 03/16/2015    Referring Provider:  PCP: [name], MD  Procedure:  Sigmoidoscopy    Attending: Harley Alto, MD  [place] Surgeon:None    SUBJECTIVE  HPI: This is a 60 yr old female with intramucosal carcinoma in a 1 cm pedunculated rectal polyp removed in Sep 2016.     History:  The patient's past medical, family, and social history was reviewed and confirmed.    ROS:    Constitutional: negative.  CV: negative.  Resp: negative.  GI: as above.  All other systems negative except as noted in the HPI.    I did review all available medical, surgical, personal/social history.    Physical Exam:   General Appearance: healthy, alert, no distress, pleasant affect, cooperative.   Eyes: conjunctivae and corneas clear. PERRL, EOM's intact. sclerae normal.   Ears: not examined.   Nose: not examined  Mouth: normal.   Neck: Neck supple. No adenopathy, thyroid symmetric, normal size.   Heart: normal rate and regular rhythm, no murmurs, clicks, or gallops.   Lungs: clear to auscultation.   Abdomen: BS normal.  Abdomen soft, non-tender.  No masses or organomegaly.   Extremities: no cyanosis, clubbing, or edema.   Skin: Skin color, texture, turgor normal. No rashes or lesions.   Rectal: not examined.   Neuro: not examined.   Mental Status: Appearance/Cooperation: in no apparent distress   Musculoskeletal: not examined    Labs/Imaging:  Performed at [facility], reviewed:  See computer database.  Performed outside of [place] (reviewed if applicable):  N/A    Impression: Sigmoidoscopy for re-evaluation of site of polypectomy in rectum that showed intramucosal carcinoma on pathology    Per patient, may discuss bx/procedure results with family member and/or leave message on voicemail at home phone.    Patient is a candidate for no sedation/moderate sedation if she requests.  Patient is ASA status:  2 - Mild, controlled systemic disease and no functional  limitations    Airway Assessment:    Mallampati image    Mallampati Class:  2, Oral Eval: Mouth opening normal, Neck ROM:  full, Thyroid-mentum distance in fingerbreaths: 4.    The procedure, risks, benefits, and alternatives were explained.  All patient questions were answered.  The informed consent was signed.    Patient barriers to learning:  none    Patient/family understanding:  verbalizes     Harley Alto, MD  Attending Physician

## 2015-03-16 NOTE — Patient Instructions (Addendum)
Discharge Instructions for colonoscopy.    Findings:  No evidence of a remaining polyp was seen during today's exam.     Activity:    Rest today, resume usual activitiy tomorrow     Diet:     High fiber diet   Low red meat    Medication:     Resume all usual medications, including aspirin if advised by your PCP      Follow up:   Repeat colonoscopy this year since you had a malignant polyp in Sep 2016   Follow-up with your primary care physician as scheduled/needed    During your procedure, air was pumped into your GI tract so your doctor could see clearly to make a diagnosis and/or treat your problem.     Some possible side effects you may experience are:   - Discomfort due to a distended (bloated) abdomen which will subside after a few hours to two days.   - Nausea may be a side effect of the medication and will subside.   - The medications you received may make you dizzy and sleepy, it is important that you do not drive, operate machinery or drink alcohol for at least one day.   - Severe pain is not expected and should be reported    Other side effects may include:  Flex. Sig. Or Colonoscopy: A small amount of diarrhea may follow the exam.    If problems, call [place] lab at [phone] during business hours Monday through Friday 7:30am to 4:30 pm.  After hours call [phone] and ask to speak to the GI Fellow on call.

## 2015-03-16 NOTE — Nursing Note (Signed)
Post-procedure; patient to RR via gurney, easy to arouse with good air exchange. Tolerated procedure well. See MD's notes for reports. Endorsed to RN Jenifer in stable condition. Luka Reisch, RN

## 2015-03-16 NOTE — Nursing Note (Signed)
Nursing Pre-Procedure Assessment    Marisa Jenkins arrived by ambulating  into clinic today at 849 from home.      Patient has arranged for a ride home from husband frank who can be contacted at [phone]. Patient agrees to having individual providing ride home present while the post procedure instructions and results are given.  Instructed patient that post sedation they are not to drive or drink alcohol for the remainder of the day.      Verified procedure with the patient scheduled today for: Flexible Sigmoidoscopy.    Outpatient Prescriptions Marked as Taking for the 03/16/15 encounter (Office Visit) with Harley Alto, MD   Medication Sig Dispense Refill    0.9 % SODIUM CHLORIDE (NACL 0.9%) 0.9 % Parenteral Solution IV at [facility] or as directed by MD;  May use another 500 mL bag as needed. 2 BAG 0    Atorvastatin (LIPITOR) 40 mg tablet Take 1 tablet by mouth every day. 90 tablet 3    Cetirizine (ZYRTEC) 10 mg Tablet Take 1 tablet by mouth once daily if needed for Allergies. 30 tablet 5    Omeprazole (PRILOSEC) 20 mg Delayed Release Capsule Take 1 capsule by mouth 2 times daily before meals for acid. 120 capsule 1    Sodium Phosphates (FLEET) 19-7 gram/118 mL Enema Insert 1 enema into the rectum one time if needed. Take Fleet enemas at home and allow time for bowel movements, on day of procedure. 133 mL 0     Anticoagulants: Patient denies taking Aspirin, NSAIDs, or other anticoagulants for the past 5 days.  OTC/Herbal preparations: no    Reviewed with the patient her health status in regards to allergies (including latex), medications, pregnancy, hypertension, atrial fibrilation, liver disease, bleeding disorders, diabetes,  CVA's, seizures, sleep apnea, glaucoma, cancer, prosthesis or implants, infectious diseases, surgical history; and disease of heart, lungs, liver, or kidneys. Positive history reviewed and updated in Health History section of the EMR.    Patient Active Problem List   Diagnosis     Hyperlipidemia    Postmenopausal HRT (hormone replacement therapy)    Migraine headache    Allergic rhinitis    Anxiety disorder    Impaired fasting glucose    Pain in joint of right hand    Internal hemorrhoids    Allergic rhinitis due to pollen    Cerumen impaction    Injury of right foot    Libido, decreased    Pain, abdominal    Tubular adenoma    Polyp of colon    Elevated liver enzymes     Allergies   Allergen Reactions    Clindamycin Other-Reaction in Comments     heartburn    Penicillin G Hives     About 2010     Past Medical History   Diagnosis Date    Allergic rhinitis 04/29/2012    Anxiety disorder 04/29/2012    Elevated liver enzymes     Hyperlipidemia 04/29/2012    Impaired fasting glucose     Internal hemorrhoids 02/27/2006    Menorrhagia     Migraine headache 04/29/2012    Pain in joint of right hand 2009     thumb joint    Postmenopausal HRT (hormone replacement therapy) 04/29/2012    Sciatica 2006    Thrombocytopenia 06/11/2006    Tubular adenoma 08/23/2014     Past Surgical History   Procedure Laterality Date    Tonsillectomy      Pr colsc flx  w/rmvl of tumor polyp lesion snare tq  04/22/2011     tubular adenoma: recheck 05/2014    Pr lap procedure, unlisted, bladder  2003     bladder repair    Pr removal of leg veins/ulcer  02/2003     vein stripping  Dr. Vira Agar    Pr colsc flx w/rmvl of tumor polyp lesion snare tq  05/18/06     tubular adenoma    Tonsillectomy and adenoidectomy  1962    Biopsy breast  2008     benign    Biopsy breast Left 10/2009     benign    Esophagogastroduodendoscopy (egd)  11/24/2014     small antral ulcerations    Colonoscopy  11/24/2014     large pedunculated polyp    Hc upper gi endoscopy,biopsy  11/24/2014    Colonoscopy  11/24/2014     History   Smoking Status    Former Smoker   Smokeless Tobacco    Never Used     Comment: quit at age 31     History   Drug Use No       Pre Procedure Checks:    Identaband on and two forms of  identification information verified: yes  Pt completed bowel prep: GI Procedure  NPO since: 630am liquid  Belongings are with patient under gurney.  Patient has: no glasses, dentures, jewelry, or hearing aides.  Barriers to Learning assessed: none. Patient verbalizes understanding of teaching and instructions.    Past anesthesia responses:  No problem.  Nursing Assessment Data Base:  Ventilation   --  Respirations: normal, unlabored.  Circulation/Perfusion -- Skin: warm, normal color and dry.  Cognition/Communication -- Behavior: alert and oriented and cooperative.  Gastro-Intestinal: no distress.  Abdomen: soft and non-tender.  Level of Ambulation: self.    Reviewed written post procedure instructions with the patient (What To Expect After Your Procedure).   Barriers to Learning assessed: none. Patient verbalizes understanding of teaching and instructions.  Nursing assessment completed by:   Marian Sorrow, RN, RN    Date: 03/16/2015 Time: 900

## 2015-03-20 ENCOUNTER — Other Ambulatory Visit: Payer: Self-pay | Admitting: Family Medicine

## 2015-03-20 ENCOUNTER — Ambulatory Visit: Payer: Commercial Managed Care - HMO

## 2015-03-20 DIAGNOSIS — Z1231 Encounter for screening mammogram for malignant neoplasm of breast: Principal | ICD-10-CM

## 2015-03-20 DIAGNOSIS — J301 Allergic rhinitis due to pollen: Secondary | ICD-10-CM

## 2015-03-20 NOTE — Nursing Note (Signed)
Patient here for Immunotherapy.  Patient and antigen were identified with 2 identifiers, including patient's confirmation of correct antigen bottle(s).  Patient's condition was then assessed, which included any immediate or delayed reaction to previous dose, current health status, Peak flow reading if indicated, use of any new meds such as antibiotic or Beta Blocker, if antihistamine had been taken within the previous 24 hours, and length of time since last allergy injection(s).    The allergy injection(s) were then administered per the build up schedule.  Patients dose was repeated.(reduced, repeated, advanced).   Patient received 0.50 of 1:1 Aq Tree/Grass/Weed subcutaneous in right arm .( dose, concentration, name of antigen).  Suzan Slick, RN      After 30 minutes observation, Patient's injection sites were checked for any local reaction. Patient then left the clinic feeling fine.  Local reaction was 0/30 (wheal/erythema).  Suzan Slick, RN

## 2015-03-27 ENCOUNTER — Ambulatory Visit: Payer: Commercial Managed Care - HMO

## 2015-03-27 DIAGNOSIS — J301 Allergic rhinitis due to pollen: Secondary | ICD-10-CM

## 2015-03-27 NOTE — Nursing Note (Signed)
20 doses Aq Tree/Grass/Weed remixed.  Heinz Eckert C Damian Hofstra, RN

## 2015-04-19 ENCOUNTER — Ambulatory Visit: Payer: Commercial Managed Care - HMO

## 2015-04-19 DIAGNOSIS — J301 Allergic rhinitis due to pollen: Secondary | ICD-10-CM

## 2015-04-19 NOTE — Nursing Note (Signed)
Patient here for Immunotherapy.  Patient and antigen were identified with 2 identifiers, including patient's confirmation of correct antigen bottle(s).  Patient's condition was then assessed, which included any immediate or delayed reaction to previous dose, current health status, Peak flow reading if indicated, use of any new meds such as antibiotic or Beta Blocker, if antihistamine had been taken within the previous 24 hours, and length of time since last allergy injection(s).    The allergy injection(s) were then administered per the build up schedule.  Patients dose was advanced.(reduced, repeated, advanced).   Patient received 0.50 of 1:1 Aq Tree/Grass/Weed subcutaneous in left arm  .( dose, concentration, name of antigen).  Suzan Slick, RN      After 30 minutes observation, Patient's injection sites were checked for any local reaction. Patient then left the clinic feeling fine.  Local reaction was 0 (wheal/erythema).  Suzan Slick, RN

## 2015-04-20 ENCOUNTER — Encounter: Payer: Self-pay | Admitting: Family Medicine

## 2015-04-20 MED ORDER — BUTALBITAL-ASPIRIN-CAFFEINE 50 MG-325 MG-40 MG CAPSULE
1.0000 | ORAL_CAPSULE | Freq: Four times a day (QID) | ORAL | 1 refills | Status: DC | PRN
  Filled 2015-04-23: qty 30, 8d supply, fill #0

## 2015-04-20 NOTE — Telephone Encounter (Signed)
From: [name]  To: [name], MD  Sent: 04/20/2015 8:00 AM PST  Subject: MyChart Refill Request    I would like to request a refill of Butalbital-tylenol-caffeine.

## 2015-04-23 ENCOUNTER — Other Ambulatory Visit: Payer: Self-pay

## 2015-04-24 ENCOUNTER — Ambulatory Visit
Admission: RE | Admit: 2015-04-24 | Discharge: 2015-04-24 | Disposition: A | Discharge status: Home / Self Care | Payer: Commercial Managed Care - HMO | Source: Ambulatory Visit | Attending: Family Medicine | Admitting: Family Medicine

## 2015-04-24 DIAGNOSIS — Z1231 Encounter for screening mammogram for malignant neoplasm of breast: Principal | ICD-10-CM | POA: Insufficient documentation

## 2015-04-26 ENCOUNTER — Ambulatory Visit: Payer: Commercial Managed Care - HMO | Admitting: Rheumatology

## 2015-04-26 ENCOUNTER — Encounter: Payer: Self-pay | Admitting: Rheumatology

## 2015-04-26 VITALS — BP 132/80 | HR 79 | Temp 97.4°F | Wt 140.7 lb

## 2015-04-26 DIAGNOSIS — J301 Allergic rhinitis due to pollen: Secondary | ICD-10-CM

## 2015-04-26 NOTE — Progress Notes (Signed)
The patient is seen in followup for her allergic rhinitis.  The patient has tolerated her immunotherapy with minimal local reactions and she feels it is of benefit.  She has not had any asthma.  She takes cetirizine daily but has not required any supplemental medications during the pollen season.    Examination: Her homonculus; notable for clear nares and clear chest.    Assessment/plan: Allergic rhinitis.  As the patient occasionally notes return of symptoms after 21 days having had 2+ years of immunotherapy, increase in the potency of the immunotherapy was discussed and the patient agrees.  She will continue her use of cetirizine.  She will return in one year.

## 2015-04-26 NOTE — Nursing Note (Signed)
Vital signs taken, allergies verified, and screened for pain.    Jabez Molner, MA

## 2015-05-01 ENCOUNTER — Other Ambulatory Visit: Payer: Self-pay | Admitting: Family Medicine

## 2015-05-01 NOTE — Telephone Encounter (Signed)
-----   Message from [name] sent at 04/30/2015  5:23 PM PST -----  Regarding: MyChart Refill Request  Contact: [phone]  Please re-fill my Diazepam prescription [facility]

## 2015-05-02 ENCOUNTER — Other Ambulatory Visit: Payer: Self-pay

## 2015-05-02 MED ORDER — DIAZEPAM 5 MG TABLET
5.0000 mg | ORAL_TABLET | Freq: Four times a day (QID) | ORAL | 0 refills | Status: DC | PRN
  Filled 2015-05-02: qty 30, 8d supply, fill #0

## 2015-05-02 NOTE — Telephone Encounter (Signed)
Prescription printed, signed and ready to fax to pharmacy. Donnae Michels, MD

## 2015-05-02 NOTE — Telephone Encounter (Signed)
Faxed. Kacyn Souder, MA I

## 2015-05-10 ENCOUNTER — Ambulatory Visit (INDEPENDENT_AMBULATORY_CARE_PROVIDER_SITE_OTHER): Payer: Commercial Managed Care - HMO

## 2015-05-10 ENCOUNTER — Ambulatory Visit: Payer: Commercial Managed Care - HMO | Attending: Family Medicine

## 2015-05-10 DIAGNOSIS — E7800 Pure hypercholesterolemia, unspecified: Secondary | ICD-10-CM

## 2015-05-10 DIAGNOSIS — R7301 Impaired fasting glucose: Secondary | ICD-10-CM | POA: Insufficient documentation

## 2015-05-10 DIAGNOSIS — J301 Allergic rhinitis due to pollen: Secondary | ICD-10-CM | POA: Insufficient documentation

## 2015-05-10 DIAGNOSIS — R748 Abnormal levels of other serum enzymes: Principal | ICD-10-CM | POA: Insufficient documentation

## 2015-05-10 LAB — HEPATIC FUNCTION PANEL
ALANINE TRANSFERASE (ALT): 40 U/L (ref 5–54)
ALBUMIN: 4.4 g/dL (ref 3.4–4.8)
ALKALINE PHOSPHATASE (ALP): 85 U/L (ref 35–115)
ASPARTATE TRANSAMINASE (AST): 28 U/L (ref 15–43)
BILIRUBIN DIRECT: 0.1 mg/dL (ref 0.0–0.2)
BILIRUBIN TOTAL: 0.3 mg/dL (ref 0.3–1.3)
PROTEIN: 7.4 g/dL (ref 6.3–8.3)

## 2015-05-10 LAB — HEPATITIS C AB SCREEN: HEPATITIS C AB SCREEN: NONREACTIVE

## 2015-05-10 LAB — HEPATITIS B SURFACE ANTIGEN: HEPATITIS B SURFACE ANTIGEN: NONREACTIVE

## 2015-05-10 NOTE — Nursing Note (Signed)
Patient here for Immunotherapy.  Patient and antigen were identified with 2 identifiers, including patient's confirmation of correct antigen bottle(s).  Patient's condition was then assessed, which included any immediate or delayed reaction to previous dose, current health status, Peak flow reading if indicated, use of any new meds such as antibiotic or Beta Blocker, if antihistamine had been taken within the previous 24 hours, and length of time since last allergy injection(s).    The allergy injection(s) were then administered per the build up schedule.  Patients dose was decreased due to remix.(reduced, repeated, advanced).   Patient received 0.30 of 1:1 Aq Tree/Grass/Weed subcutaneous in right arm.( dose, concentration, name of antigen).  Suzan Slick, RN      After 30 minutes observation, Patient's injection sites were checked for any local reaction. Patient then left the clinic feeling fine.  Local reaction was 2/2 (wheal/erythema).  Suzan Slick, RN

## 2015-05-11 LAB — HEMOGLOBIN A1C
HGB A1C,GLUCOSE EST AVG: 117 mg/dL
HGB A1C: 5.7 % — AB (ref 3.9–5.6)

## 2015-05-21 ENCOUNTER — Encounter: Payer: Self-pay | Admitting: Student in an Organized Health Care Education/Training Program

## 2015-05-21 ENCOUNTER — Ambulatory Visit
Payer: Commercial Managed Care - HMO | Attending: Dermatology | Admitting: Student in an Organized Health Care Education/Training Program

## 2015-05-21 ENCOUNTER — Other Ambulatory Visit: Payer: Self-pay

## 2015-05-21 VITALS — BP 134/74 | HR 79 | Resp 18

## 2015-05-21 DIAGNOSIS — M67449 Ganglion, unspecified hand: Secondary | ICD-10-CM

## 2015-05-21 DIAGNOSIS — M67441 Ganglion, right hand: Secondary | ICD-10-CM | POA: Insufficient documentation

## 2015-05-21 DIAGNOSIS — B351 Tinea unguium: Principal | ICD-10-CM | POA: Insufficient documentation

## 2015-05-21 MED ORDER — TERBINAFINE HCL 250 MG TABLET
250.0000 mg | ORAL_TABLET | Freq: Every day | ORAL | 4 refills | Status: AC
Start: 2015-05-21 — End: 2016-05-20
  Filled 2015-05-21: qty 30, 30d supply, fill #0
  Filled 2015-06-20: qty 30, 30d supply, fill #1
  Filled 2015-09-28: qty 30, 30d supply, fill #2

## 2015-05-21 NOTE — Progress Notes (Signed)
[name], MD  [place] [address]    05/21/2015    Dear Dr. [name]:    I had the pleasure of seeing your patient Marisa Jenkins, a 25yr  female at your request in consultation for nail changes.     The patient's chief concern is nail changes.      Questionnaire dated 05/21/2015 reviewed by me.  Past Medical History:   has a past medical history of Allergic rhinitis (04/29/2012); Anxiety disorder (04/29/2012); Elevated liver enzymes; Hyperlipidemia (04/29/2012); Impaired fasting glucose; Internal hemorrhoids (02/27/2006); Menorrhagia; Migraine headache (04/29/2012); Pain in joint of right hand (2009); Postmenopausal HRT (hormone replacement therapy) (04/29/2012); Sciatica (2006); Thrombocytopenia (06/11/2006); and Tubular adenoma (08/23/2014).   Prior history of skin cancer none  Allergies:   Clindamycin and Penicillin g    Family History:                                             There is a family history of skin cancer, type: BCC   Social History:  The patient does not smoke or use tobacco products. Reviewed in EMR 05/21/2015  Review of Systems: The patient has been asked about current:  fever, fatigue, weight loss, visual changes, wheezing, cough, hay fever/pollen allergy, headaches, sore throat, bleeding gums, joint or muscle pain, shortness of breath, chest pain, lymph node swelling, infections, pains in calves, nausea, vomiting, diarrhea, burning with urination, depression/anxiety, numbness/tingling, or dizziness.  Positives are: none. All other ROS are negative.     Physical Examination:    A full body skin exam was done relating to prior skin cancer, moles/pigmented lesions, skin photodamage.    LMP 10/28/2010  On physical examination, the patient is well developed, well nourished, and pleasant with normal affect. The patient is alert and oriented to time, place, and person.     The following areas were inspected and the findings were normal except for  the following pertinent abnormal findings listed in the integrated HPI/Exam/Assessment/Plan section below:    Head/scalp including palpation, face, eyes, eyelids and conjunctiva, nose, mouth/oral mucosa, lips, neck including palpation, chest (including the breasts and axillae), abdomen, buttocks/groin, back, right and left upper extremities, digit, nail, and the right and left lower extremities, digits, and nails. Digits/nails were inspected and palpated (for clubbing, cyanosis, inflammation). The lower extremities were inspected for peripheral swelling, varicosities.     Lab/Imaging was reviewed 05/21/2015 by me.     Lab Results   Lab Name Value Date/Time    WBC 5.0 08/29/2014 08:05 AM    HGB 13.4 08/29/2014 08:05 AM    HCT 40.1 08/29/2014 08:05 AM    PLT 238 08/29/2014 08:05 AM     Lab Results   Component Value Date    FRTN 123 06/28/2013     Lab Results   Lab Name Value Date/Time    AST 28 05/10/2015 07:54 AM    ALT 40 05/10/2015 07:54 AM    ALP 85 05/10/2015 07:54 AM    ALB 4.4 05/10/2015 07:54 AM    TP 7.4 05/10/2015 07:54 AM    TBIL 0.3 05/10/2015 07:54 AM     Lab Results   Lab Name Value Date/Time    TSH 1.72 06/28/2013 02:12 PM        Lab Results   Lab Name Value  Date/Time    CHOL 211 (H) 01/25/2015 09:19 AM    LDLC 127 01/25/2015 09:19 AM    HDL 67 01/25/2015 09:19 AM    TRIG 85 01/25/2015 09:19 AM     Results for orders placed or performed in visit on 03/13/15   DERMATOLOGY PATHOLOGY   Result Value Ref Range    TRANSCRIPTIONS          DIAGNOSIS                              SKIN, LEFT THIRD TOE, (BIOPSY-NAIL CLIPPING)          ONYCHOMYCOSIS, SEE NOTE.           Note: The PASD stain is "negative" for fungi but limited by technical artifact     (tissue fragmentation); fungal hyphae are seen on the H&E stain.     TISSUES                                A. Skin - LEFT 3RD TOE     ORDER INFORMATION                      Anatomic Site:                                                                                      left 3rd toe                                                                    Suspected Diagnosis:     onychomycosis                                                                   Lesion Description/ Other Clinical Information:     Not provided                                                                    Procedure:                            Collected by:  JABAD  Specimen Handling:                                Special Requests:     PAS stain                                                                        GROSS DESCRIPTION                      Received in formalin is a skin segment measuring 0.5 cm x 0.1 cm x 0.1 cm, bisected,     entirely submitted in 1 block.jf.     MICROSCOPIC DESCRIPTION             Sections show fragments of compact orthokeratosis consistent with nail plate containing  hyphae (H&E only).     My electronic signature below is attestation that I have personally reviewed  the submitted specimen(s) and the description and diagnosis on this report  reflects this evaluation.     Signed (electronically signed) Barbaraann Faster, MD 03/16/15 0818                                                 Dermatopathologist                 Lab Results   Component Value Date    HBSAG Nonreactive 05/10/2015    HEPCABSCR Nonreactive 05/10/2015        Impression/Problems/Plan:   Dx: Onychomycosis  HPI: reports having changes in two toenails on left third and right great toe since a few years ago. Has not treated these yet. Reports her husband has had more extensive disease. She had nail clipping in the last visit with Dr. Vilinda Blanks as above that came back as onychomycosis.  PE: slight subungual hyperkeratosis, distal onycholysis, slight yellow discoloration of left third and right first toenails  Plan:   - Terbinafine (LAMISIL) 250 mg Tablet; Take 1 tablet by mouth every day.    - CBC WITH MANUAL DIFFERENTIAL; Standing, Q 4 weeks   - HEPATIC FUNCTION  PANEL; Standing, Q 4 weeks     Dx: Mucinous cyst  HPI: reports having a cyst spot on the right middle finger, present for a few months. Not so painful, or itching, but has caused slight unwanted changes in the nail. Has not treated the lesion yet.Notes no pain or irritation but does not like nail canal produced.    PE: a 3 mm skin colored cystic lesion peri ungually close to right third fingernail, longitudinal depresssion distally on nail.   Plan:   -- different options including drainage of mucinous ingredients, as well as shave removal of the lesion was explained. Will continue to monitor for now, and will seek further treatment if becomes symptomatic.        Education needs were addressed and there were no barriers to learning.     The patient was seen and discussed with Dr. Corrinne Eagle.    Otho Najjar, MD  Department of Dermatology, PGY-3  [facility] [place]  [place] #: [id number]

## 2015-05-21 NOTE — Nursing Note (Signed)
Vital signs taken, allergies verified, screened for pain, and pharmacy verified.  Youssouf Shipley, LVN

## 2015-05-21 NOTE — Progress Notes (Signed)
I was present with the resident during the history and exam.  I discussed the case with the resident and agree with the assessment and plan we developed as documented in the resident's note.     The patient was counseled about each problem and about sun protection, Vitamin D3 supplementation, and the ABCDs of melanoma.  Information sheets given and explained.     Report electronically signed by Leotha Westermeyer Ann Kitzia Camus, MD. Attending

## 2015-05-22 ENCOUNTER — Ambulatory Visit: Payer: Commercial Managed Care - HMO

## 2015-05-22 DIAGNOSIS — J301 Allergic rhinitis due to pollen: Secondary | ICD-10-CM

## 2015-05-22 NOTE — Nursing Note (Signed)
Patient here for Immunotherapy.  Patient and antigen were identified with 2 identifiers, including patient's confirmation of correct antigen bottle(s).  Patient's condition was then assessed, which included any immediate or delayed reaction to previous dose, current health status, Peak flow reading if indicated, use of any new meds such as antibiotic or Beta Blocker, if antihistamine had been taken within the previous 24 hours, and length of time since last allergy injection(s).    The allergy injection(s) were then administered per the build up schedule.  Patients dose was advanced.(reduced, repeated, advanced).   Patient received 0.40 of 1:1 Aq Tree/Grass/Weed subcutaneous in left arm .( dose, concentration, name of antigen).  Tonio Seider C Uniqua Kihn, RN      After 30 minutes observation, Patient's injection sites were checked for any local reaction. Patient then left the clinic feeling fine.  Local reaction was 0 (wheal/erythema).  Jakye Mullens C Anayla Giannetti, RN

## 2015-06-07 ENCOUNTER — Ambulatory Visit: Payer: Commercial Managed Care - HMO

## 2015-06-07 DIAGNOSIS — J301 Allergic rhinitis due to pollen: Secondary | ICD-10-CM

## 2015-06-07 NOTE — Nursing Note (Signed)
Patient here for Immunotherapy.  Patient and antigen were identified with 2 identifiers, including patient's confirmation of correct antigen bottle(s). Per Allergy protocol the patient's condition was then assessed, which included any immediate or delayed reaction to previous dose, current health status, Peak flow reading if indicated, use of any new meds such as antibiotic or Beta Blocker, if antihistamine had been taken within the previous 24 hours, and length of time since last allergy injection(s).    The allergy injection(s) were then administered per the build up schedule.  Patients dose was advanced.(reduced, repeated, advanced).   Patient received 0.50 mLof 1:1 Aq Tree/Grass/Weed subcutaneous in right arm( dose, concentration, name of antigen).  Calil Amor C Maleeyah Mccaughey, RN

## 2015-06-07 NOTE — Nursing Note (Signed)
After 30 minutes observation, Patient's injection sites were checked for any local reaction. Patient then left the clinic feeling fine.  Local reaction was 0 (wheal/erythema).  Mycah Mcdougall C Callaway Hailes, RN

## 2015-06-11 ENCOUNTER — Other Ambulatory Visit: Payer: Self-pay

## 2015-06-15 ENCOUNTER — Telehealth: Payer: Self-pay | Admitting: Gastroenterology

## 2015-06-15 NOTE — Telephone Encounter (Signed)
Spoke with patient and confirmed her upcoming appointment for Colonoscopy on 06/22/2015 @ 0900 AM. Verbalized understanding of her pre-op instruction. Advised to call back if any questions or concerns prior to her procedure. Kaleen Odea, RN

## 2015-06-20 ENCOUNTER — Other Ambulatory Visit: Payer: Self-pay

## 2015-06-22 ENCOUNTER — Ambulatory Visit: Payer: Commercial Managed Care - HMO | Attending: Anatomic Pathology & Clinical Pathology | Admitting: Gastroenterology

## 2015-06-22 ENCOUNTER — Encounter: Payer: Self-pay | Admitting: Gastroenterology

## 2015-06-22 VITALS — BP 118/67 | HR 76 | Temp 96.8°F | Resp 18 | Ht 65.0 in | Wt 138.2 lb

## 2015-06-22 DIAGNOSIS — K635 Polyp of colon: Secondary | ICD-10-CM | POA: Insufficient documentation

## 2015-06-22 DIAGNOSIS — Z85038 Personal history of other malignant neoplasm of large intestine: Secondary | ICD-10-CM | POA: Insufficient documentation

## 2015-06-22 DIAGNOSIS — Z86010 Personal history of colonic polyps: Secondary | ICD-10-CM | POA: Insufficient documentation

## 2015-06-22 DIAGNOSIS — D122 Benign neoplasm of ascending colon: Secondary | ICD-10-CM | POA: Insufficient documentation

## 2015-06-22 HISTORY — PX: COLONOSCOPY: GILAB00002

## 2015-06-22 MED ORDER — FENTANYL (PF) 50 MCG/ML INJECTION SOLUTION
INTRAMUSCULAR | 0 refills | Status: DC
Start: 2015-06-22 — End: 2015-06-22

## 2015-06-22 MED ORDER — MIDAZOLAM (PF) 1 MG/ML INJECTION SOLUTION
INTRAMUSCULAR | 0 refills | Status: DC
Start: 2015-06-22 — End: 2015-06-22

## 2015-06-22 MED ORDER — SODIUM CHLORIDE 0.9 % INTRAVENOUS SOLUTION
INTRAVENOUS | 0 refills | Status: DC
Start: 2015-06-22 — End: 2015-06-22

## 2015-06-22 MED ORDER — DIPHENHYDRAMINE 50 MG/ML INJECTION SOLUTION
INTRAMUSCULAR | 0 refills | Status: DC
Start: 2015-06-22 — End: 2015-06-22

## 2015-06-22 MED ORDER — PROMETHAZINE 25 MG/ML INJECTION SOLUTION
INTRAMUSCULAR | 0 refills | Status: DC
Start: 2015-06-22 — End: 2015-06-22

## 2015-06-22 NOTE — Nursing Note (Signed)
Post procedure nursing note:  Colonoscopy completed. See MD Notes. Procedure tolerated well. To RR, stable. Report to Maria RN

## 2015-06-22 NOTE — Progress Notes (Signed)
Amount of time spent educating patient: 30 minutes    Education     Title: AMB (Adult) GI Endoscopy (Resolved)     Topic: Gastrointestinal Endoscopy Overview (Resolved)     Point: Description (Resolved)    Description: Description    Learning Progress Summary    Learner Readiness Method Response Comment Documented by Status   Patient Acceptance E VU  HG 06/22/15 0842 Done               Point: Anatomy (Resolved)    Description: Anatomy    Learning Progress Summary    Learner Readiness Method Response Comment Documented by Status   Patient Acceptance E VU  HG 06/22/15 0842 Done               Point: Physiology (Resolved)    Description: Physiology    Learning Progress Summary    Learner Readiness Method Response Comment Documented by Status   Patient Acceptance E VU  HG 06/22/15 0842 Done                Topic: Treatment Plan (Resolved)     Point: Medication: Analgesics (Resolved)    Description: Medication: Analgesics    Learning Progress Summary    Learner Readiness Method Response Comment Documented by Status   Patient Acceptance E VU  FT 06/22/15 1035 Done               Point: Medication: Sedation Agents (Resolved)    Description: Medication: Sedation Agents    Learning Progress Summary    Learner Readiness Method Response Comment Documented by Status   Patient Acceptance E VU  FT 06/22/15 1035 Done               Point: Diet: NPO Prior to Exam (Resolved)    Description: Diet: NPO Prior to Exam    Learning Progress Summary    Learner Readiness Method Response Comment Documented by Status   Patient Acceptance E VU  FT 06/22/15 1035 Done    Acceptance E VU  HG 06/22/15 0842 Done               Point: Diet: Advance As Tolerated Post Exam (Resolved)    Description: Diet: Advance As Tolerated Post Exam    Learning Progress Summary    Learner Readiness Method Response Comment Documented by Status   Patient Acceptance E VU  FT 06/22/15 1035 Done    Acceptance E VU  HG 06/22/15 0842 Done               Point: Treatment: Oxygen  Therapy (Resolved)    Description: Treatment: Oxygen Therapy    Learning Progress Summary    Learner Readiness Method Response Comment Documented by Status   Patient Acceptance E VU  FT 06/22/15 1035 Done               Point: Procedure: Intravenous Access (Resolved)    Description: Procedure: Intravenous Access    Learning Progress Summary    Learner Readiness Method Response Comment Documented by Status   Patient Acceptance E VU  FT 06/22/15 1035 Done    Acceptance E VU  HG 06/22/15 0842 Done               Point: Procedure: Monitoring (Resolved)    Description: Procedure: Monitoring    Learning Progress Summary    Learner Readiness Method Response Comment Documented by Status   Patient Acceptance E VU  FT 06/22/15 1035 Done  Topic: Self-Management (Resolved)     Point: Diet As Tolerated (Resolved)    Description: Diet As Tolerated    Learning Progress Summary    Learner Readiness Method Response Comment Documented by Status   Patient Acceptance E VU  MD 06/22/15 1127 Done   Family Acceptance E VU  MD 06/22/15 1127 Done               Point: Rest the Day of Procedure (Resolved)    Description: Rest the Day of Procedure    Learning Progress Summary    Learner Readiness Method Response Comment Documented by Status   Patient Acceptance E VU  MD 06/22/15 1127 Done   Family Acceptance E VU  MD 06/22/15 1127 Done               Point: Resume Activities As Directed (Resolved)    Description: Resume Activities As Directed    Learning Progress Summary    Learner Readiness Method Response Comment Documented by Status   Patient Acceptance E VU  MD 06/22/15 1127 Done   Family Acceptance E VU  MD 06/22/15 1127 Done               Point: Resume Home Medications As Directed (Resolved)    Description: Resume Home Medications As Directed    Learning Progress Summary    Learner Readiness Method Response Comment Documented by Status   Patient Acceptance E VU  MD 06/22/15 1127 Done   Family Acceptance E VU  MD 06/22/15 1127  Done                Topic: When to Seek Medical Attention (Resolved)     Point: Blood In Stool (Resolved)    Description: Blood In Stool    Learning Progress Summary    Learner Readiness Method Response Comment Documented by Status   Patient Acceptance E VU  MD 06/22/15 1127 Done   Family Acceptance E VU  MD 06/22/15 1127 Done               Point: Blood In Vomitus (Resolved)    Description: Blood In Vomitus    Learning Progress Summary    Learner Readiness Method Response Comment Documented by Status   Patient Acceptance E VU  MD 06/22/15 1127 Done   Family Acceptance E VU  MD 06/22/15 1127 Done               Point: Fever (Resolved)    Description: Fever    Learning Progress Summary    Learner Readiness Method Response Comment Documented by Status   Patient Acceptance E VU  MD 06/22/15 1127 Done   Family Acceptance E VU  MD 06/22/15 1127 Done               Point: Pain, Unresolved or Worsening (Resolved)    Description: Pain, Unresolved or Worsening    Learning Progress Summary    Learner Readiness Method Response Comment Documented by Status   Patient Acceptance E VU  MD 06/22/15 1127 Done   Family Acceptance E VU  MD 06/22/15 1127 Done               Point: Swallowing Difficulties (Resolved)    Description: Swallowing Difficulties    Learning Progress Summary    Learner Readiness Method Response Comment Documented by Status   Patient Acceptance E VU  MD 06/22/15 1127 Done   Family Acceptance E VU  MD 06/22/15 1127 Done  Point: Vomiting (Resolved)    Description: Vomiting    Learning Progress Summary    Learner Readiness Method Response Comment Documented by Status   Patient Acceptance E VU  MD 06/22/15 1127 Done   Family Acceptance E VU  MD 06/22/15 1127 Done                      User Key     Initials Effective Dates Name Provider Type Discipline    MD 11/09/14 -  Ian Malkin, RN .NURSE: (RN or LVN) Interdisciplinary    FT 11/09/14 -  Fe B Tio, RN .NURSE: (RN or LVN) Interdisciplinary    HG 11/09/14  -  Kaleen Odea, RN .NURSE: Investment banker, corporate or LVN) Interdisciplinary                     gi

## 2015-06-22 NOTE — Procedures (Signed)
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  Risks of perforation, hemorrhage, adverse drug reaction, pain, infection, missed lesion(s), incomplete examination, and aspiration were discussed.  A timeout was performed by nursing and medical staff to identify the patient (using two patient identifiers) and appropriate procedure. The patient was placed in the left lateral decubitus position. The patient was monitored continuously with EKG tracing, pulse oximetry, blood pressure monitoring, and direct observations.      A rectal examination was performed.  The Olympus colonoscope was inserted into the rectum and advanced under direct vision to the cecum, which was identified by the ileocecal valve and appendiceal orifice.  The quality of the colonic preparation was good.  A careful inspection was made as the colonscope was withdrawn, including a detailed view of the rectum- due to a short rectal vault retroflexion was not possible; findings and interventions are described below.  Photo documentation was obtained.  The patient tolerance of the procedure was good, and there were no complications.  She was taken to the recovery area in stable condition.      Sedation:  Sedation was administered for the procedure  Fentanyl  125 mcg IV  Versed 5 mg IV  Benadryl 50 mg IV    Findings and Interventions:      Cecum: normal  Ascending colon: sessile 3 mm polyp removed by cold forceps  Transverse colon: normal  Descending colon: sessile 2 mm polyp removed by cold forceps  Sigmoid colon: normal  Rectum: normal            Impression:  Two diminutive polyps (ascending and descending) removed  No evidence of remnant cancerous polyp tissue in the rectum      Recommendation/Plan:  Await pathology result  High fiber diet  Low red meat       Harley Alto, MD  Attending Physician

## 2015-06-22 NOTE — Nursing Note (Signed)
Nursing Pre-Procedure Assessment    Marisa Jenkins arrived by ambulating  into clinic today at [place] from home.      Patient has arranged for a ride home from husband, Marisa Jenkins who can be contacted at [phone]. Patient agrees to having individual providing ride home present while the post procedure instructions and results are given.  Instructed patient that post sedation they are not to drive or drink alcohol for the remainder of the day.      Verified procedure with the patient scheduled today for: Colonoscopy.    Outpatient Prescriptions Marked as Taking for the 06/22/15 encounter (Office Visit) with Harley Alto, MD   Medication Sig Dispense Refill    0.9 % SODIUM CHLORIDE (NACL 0.9%) 0.9 % Parenteral Solution IV at [facility] or as directed by MD;  May use another 500 mL bag as needed. 2 BAG 0    Cetirizine (ZYRTEC) 10 mg Tablet Take 1 tablet by mouth once daily if needed for Allergies. 30 tablet 5    DiphenhydrAMINE (BENADRYL) 50 mg/mL Injection 12.5-25 mg IV every 3 min PRN (NTE 50 mg); Give 25mg  PRN inadequate sedation after Midazolam given or as directed by MD 2 mL 0    Estradiol-Norethindrone Acet (COMBIPATCH) 0.05-0.14 mg/24 hr Semiweekly Patch Apply 1 patch to the skin every Monday and Thursday. 8 patch 11    Fentanyl (SUBLIMAZE) 50 mcg/mL Injection 12.5-50 mcg IV every 3 min PRN pain (NTE 200 mcg); Give 25 mcg until patient sedated but arousable or as directed by MD 4 mL 0    Midazolam (VERSED) 1 mg/mL Injection 0.5-2 mg IV every 3 min PRN sedation (NTE 10 mg); Give 1 mg until patient sedated but arousable or as directed by MD 10 mL 0    PEG 3350-Electrolytes (GAVILYTE-C) 240-22.72-6.72 -5.84 gram Reconstitute Solution Take as directed 4000 mL 0    Promethazine (PHENERGAN) 25 mg/mL Injection 6.25-25 mg IV PRN (NTE 25 mg);Give 6.25 mg inadequate sedation after Diphenhydramine given or after Midazolam given if patient has Diphenhydramine contraindication or as directed by MD; Administer IV push over at  least 2 minutes with minimum 10 mL dilution in NS. 1 mL 0     Anticoagulants: Patient denies taking Aspirin, NSAIDs, or other anticoagulants for the past 5 days.  OTC/Herbal preparations: see med list    Reviewed with the patient her health status in regards to allergies (including latex), medications, pregnancy, hypertension, atrial fibrilation, liver disease, bleeding disorders, diabetes,  CVA's, seizures, sleep apnea, glaucoma, cancer, prosthesis or implants, infectious diseases, surgical history; and disease of heart, lungs, liver, or kidneys. Positive history reviewed and updated in Health History section of the EMR.    Patient Active Problem List   Diagnosis    Hyperlipidemia    Postmenopausal HRT (hormone replacement therapy)    Migraine headache    Allergic rhinitis    Anxiety disorder    Impaired fasting glucose    Pain in joint of right hand    Internal hemorrhoids    Allergic rhinitis due to pollen    Cerumen impaction    Injury of right foot    Libido, decreased    Pain, abdominal    Tubular adenoma    Polyp of colon    Elevated liver enzymes    Colon cancer     Allergies   Allergen Reactions    Clindamycin Other-Reaction in Comments     heartburn    Penicillin G Hives     About 2010  Past Medical History   Diagnosis Date    Allergic rhinitis 04/29/2012    Anxiety disorder 04/29/2012    Elevated liver enzymes     Hyperlipidemia 04/29/2012    Impaired fasting glucose     Internal hemorrhoids 02/27/2006    Menorrhagia     Migraine headache 04/29/2012    Pain in joint of right hand 2009     thumb joint    Postmenopausal HRT (hormone replacement therapy) 04/29/2012    Sciatica 2006    Thrombocytopenia 06/11/2006    Tubular adenoma 08/23/2014     Past Surgical History   Procedure Laterality Date    Tonsillectomy      Pr colsc flx w/rmvl of tumor polyp lesion snare tq  04/22/2011     tubular adenoma: recheck 05/2014    Pr lap procedure, unlisted, bladder  2003     bladder repair     Pr removal of leg veins/ulcer  02/2003     vein stripping  Dr. Vira Agar    Pr colsc flx w/rmvl of tumor polyp lesion snare tq  05/18/06     tubular adenoma    Tonsillectomy and adenoidectomy  1962    Biopsy, breast  2008     benign    Biopsy, breast Left 10/2009     benign    Esophagogastroduodenoscopy (egd)  11/24/2014     small antral ulcerations    Colonoscopy  11/24/2014     large pedunculated polyp    Hc upper gi endoscopy,biopsy  11/24/2014    Colonoscopy  11/24/2014    Pr sigmoidoscopy,biopsy  03/16/2015    Flexible sigmoidoscopy  03/16/2015     History   Smoking Status    Former Smoker   Smokeless Tobacco    Never Used     Comment: quit at age 36     History   Drug Use No       Pre Procedure Checks:    Identaband on and two forms of identification information verified: yes  Pt completed bowel prep: GI Procedure  NPO since: last dose of her prep at 0630 AM  Belongings are with patient under gurney.  Patient has: no glasses, dentures, jewelry, or hearing aides.  Barriers to Learning assessed: none. Patient verbalizes understanding of teaching and instructions.    Past anesthesia responses:  No problem.  Nursing Assessment Data Base:  Ventilation   --  Respirations: normal, unlabored.  Circulation/Perfusion -- Skin: warm, normal color and dry.  Cognition/Communication -- Behavior: alert and oriented, cooperative and calm.  Gastro-Intestinal: no distress.  Abdomen: soft, round and non-tender.  Level of Ambulation: self.    Reviewed written post procedure instructions with the patient (What To Expect After Your Procedure).   Barriers to Learning assessed: none. Patient verbalizes understanding of teaching and instructions.  Nursing assessment completed by:   Kaleen Odea, RN, RN    Date: 06/22/2015 Time: [phone]

## 2015-06-22 NOTE — Patient Instructions (Addendum)
Discharge Instructions for colonoscopy.    Findings:  1. Two tiny colon polyps (1 in the right and 1 in the left) completely removed  2. No evidence of a remnant of the previously resected cancerous polyp in the rectum    Activity:    Rest today, resume usual activitiy tomorrow     Diet:     High fiber diet   Low red meat    Medication:     Resume all usual medications      Follow up:    We will notify you of your biopsy results via phone call or letter.If you do not receive notice of your results by three weeks, please call us at the phone number provided below.     Follow-up with your primary care physician as scheduled/needed    During your procedure, air was pumped into your GI tract so your doctor could see clearly to make a diagnosis and/or treat your problem.     Some possible side effects you may experience are:   - Discomfort due to a distended (bloated) abdomen which will subside after a few hours to two days.   - Nausea may be a side effect of the medication and will subside.   - The medications you received may make you dizzy and sleepy, it is important that you do not drive, operate machinery or drink alcohol for at least one day.   - Severe pain is not expected and should be reported    Other side effects may include:  Flex. Sig. Or Colonoscopy: A small amount of diarrhea may follow the exam.  Polyp Removal in Colon: A small amount of bleeding is not unusual. If it continues after one day, notify the doctor. If there is a large amount of bright red blood, go to the emergency Room and be seen by a doctor.    If problems, call [facility] at [phone] during business hours Monday through Friday 8:00 am to 5:00 pm.  After hours call [phone] and ask to speak to the GI Fellow on call.         Colon Polyps    A polyp is extra tissue that grows inside your body. Colon polyps grow in the large intestine.  The large  intestine, also called the colon, is part of your digestive system. It's a long, hollow tube at the end of your digestive tract where your body makes and stores stool.    Are polyps dangerous?  Most polyps are not dangerous. Most are benign which means they are not cancer. But over time, some types of polyps can turn into cancer. Usually, polyps that are smaller than a pea aren't harmful. But larger polyps could someday become cancer or may already be cancer. There are two common types of polyps, hyperplastic and adenomatous polyps.  Hyperplastic polyps generally remain small and are not precnacerous.  Adenoumatous polyps or adenomas, have the potential to grow large and become cancerous.  To be safe, doctors remove all polyps and test them to determine what type they are.    Who gets polyps?  Anyone can get polyps, but certain people are more likely than others. You may have a greater chance of getting polyps if you're over 58, you've had polyps before, someone in your family has had polyps, someone in your family has had cancer of the large intestine.   You may also be more likely to get polyps if you eat a lot  of fatty foods, smoke, drink alcohol, don't exercise, or weigh too much.    What are the symptoms?  Most small polyps don't cause symptoms. Often, people don't know they have one until the doctor finds it during a regular checkup or while testing them for something else.  Some people do have symptoms including bleeding from the anus, constipation or diarrhea that lasts more than a week, blood in the stool. Blood can make stool look black, or it can show up as red streaks in the stool.  If you have any of these symptoms, see a doctor to find out what the problem is.    How does the doctor test for polyps?  The doctor can use four tests to check for polyps:   Digital rectal exam. The doctor wears gloves and checks your rectum, the last part of the large intestine, to see if it feels normal. This test would  find polyps only in the rectum, so the doctor may need to do one of the other tests listed below to find polyps higher up in the intestine.   Barium enema. The doctor puts a liquid called barium into your rectum before taking x rays of your large intestine. Barium makes your intestine look white in the pictures. Polyps are dark, so they're easy to see.   Sigmoidoscopy:  With this test, the doctor can see inside your large intestine. The doctor puts a thin flexible tube into your rectum. The device is called a sigmoidoscope, and it has a light and a tiny video camera in it. The doctor uses the sigmoidoscope to look at the last third of your large intestine.   Colonoscopy:  This test is like sigmoidoscopy, but the doctor looks at all of the large intestine. It usually requires sedation.  Who should get tested for polyps?  Talk to your doctor about getting tested for polyps if you have symptoms, you're 83 years old or older, someone in your family has had polyps or colon cancer.    How are polyps treated?  The doctor will remove the polyp. Sometimes, the doctor takes it out during sigmoidoscopy or colonoscopy. Or the doctor may decide to operate through the abdomen.  The polyp is then tested for cancer.  If you've had polyps, the doctor may want you to get tested regularly in the future.    How can I prevent polyps?  Doctors don't know of any one sure way to prevent polyps. But you might be able to lower your risk of getting them if you eat more fruits and vegetables and less fatty food, don't smoke, avoid alcohol, exercise every day, lose weight if you're overweight.      Eating more calcium and folate can also lower your risk of getting polyps. Some foods that are rich in calcium are milk, cheese, and broccoli. Some foods that are rich in folate are chickpeas, kidney beans, and spinach.  Some doctors think that aspirin might help prevent polyps. Studies are under way.     Points to Remember   A polyp is extra  tissue that grows inside the body. Most polyps are not harmful.   Symptoms may include constipation or diarrhea for more than a week or blood on your underwear, on toilet paper, or in your stool.   Many polyps do not cause symptoms.   Doctors remove all polyps and test them for cancer.   Talk to your doctor about getting tested for polyps if  o you have  any symptoms  o you're 86 years old or older  o someone in your family has had polyps or colon cancer

## 2015-06-22 NOTE — Progress Notes (Signed)
GASTROENTEROLOGY/HEPATOLOGY PRE-PROCEDURE HISTORY & PHYSICAL    Date of Service: 06/22/2015    Referring Provider:  PCP: [name], MD  Procedure:  Colonoscopy    Attending: Harley Alto, MD  [place] Surgeon:None    SUBJECTIVE  HPI: This is a 67yr female    Family history of colon cancer- no  Family history of colon polyps- no  Last colonoscopy - Sep 2016  Previous history of polyps- yes, rectal pedunculated adenomatous polyp with intramucosal cancer (Sep 2016)      History:  The patient's past medical, family, and social history was reviewed and confirmed.    ROS:    Constitutional: negative.  CV: negative.  Resp: negative.  GI: as above.  All other systems negative except as noted in the HPI.    I did review all available medical, surgical, personal/social history.    Physical Exam:   General Appearance: healthy, alert, no distress, pleasant affect, cooperative.   Eyes: conjunctivae and corneas clear. PERRL, EOM's intact. sclerae normal.   Ears: not examined.   Nose: not examined  Mouth: normal.   Neck: Neck supple. No adenopathy, thyroid symmetric, normal size.   Heart: normal rate and regular rhythm, no murmurs, clicks, or gallops.   Lungs: clear to auscultation.   Abdomen: BS normal.  Abdomen soft, non-tender.  No masses or organomegaly.   Extremities: no cyanosis, clubbing, or edema.   Skin: Skin color, texture, turgor normal. No rashes or lesions.   Rectal: not examined.   Neuro: not examined.   Mental Status: Appearance/Cooperation: in no apparent distress   Musculoskeletal: not examined    Labs/Imaging:  Performed at [facility], reviewed:  See computer database.  Performed outside of [place] (reviewed if applicable):  N/A    Impression: Colonoscopy for surveillance of adenomas in the colon     Per patient, may discuss bx/procedure results with family member and/or leave message on voicemail at home phone.    Patient is a candidate for moderate sedation.  Patient is ASA status:  2 - Mild, controlled  systemic disease and no functional limitations    Airway Assessment:    Mallampati image    Mallampati Class:  2, Oral Eval: Mouth opening normal, Neck ROM:  full, Thyroid-mentum distance in fingerbreaths: 4.    The procedure, risks, benefits, and alternatives were explained.  All patient questions were answered.  The informed consent was signed.    Patient barriers to learning:  none    Patient/family understanding:  Cresenciano Lick, MD  Associate Clinical Professor

## 2015-06-26 ENCOUNTER — Encounter: Payer: Self-pay | Admitting: Family Medicine

## 2015-06-26 LAB — SURGICAL PATHOLOGY

## 2015-06-27 ENCOUNTER — Other Ambulatory Visit: Payer: Self-pay | Admitting: Family Medicine

## 2015-06-28 ENCOUNTER — Ambulatory Visit (INDEPENDENT_AMBULATORY_CARE_PROVIDER_SITE_OTHER): Payer: Commercial Managed Care - HMO

## 2015-06-28 ENCOUNTER — Ambulatory Visit: Payer: Commercial Managed Care - HMO | Attending: Dermatology

## 2015-06-28 DIAGNOSIS — J301 Allergic rhinitis due to pollen: Secondary | ICD-10-CM | POA: Insufficient documentation

## 2015-06-28 DIAGNOSIS — B351 Tinea unguium: Principal | ICD-10-CM

## 2015-06-28 LAB — CBC WITH MANUAL DIFFERENTIAL
BASOPHILS %: 1 %
BASOPHILS ABS: 0.05 10*3/uL (ref 0.0–0.5)
EOSINOPHILS %: 2 %
EOSINOPHILS ABS: 0.1 10*3/uL (ref 0.1–0.3)
HEMATOCRIT: 41.1 % (ref 36–46)
HEMOGLOBIN: 14.1 g/dL (ref 12.0–16.0)
LYMPHOCYTES ABS: 1.54 10*3/uL (ref 1.0–4.8)
LYMPHS %: 32 %
MCH: 32.9 pg (ref 27–33)
MCHC: 34.3 % (ref 32–36)
MCV: 96.1 UM3 (ref 80–100)
MONOCYTES %: 7 %
MONOCYTES ABS: 0.34 10*3/uL (ref 0.2–0.4)
MPV: 8.3 UM3 (ref 6.8–10.0)
NEUTROPHIL ABS: 2.78 10*3/uL (ref 1.80–7.70)
PLATELET COUNT: 219 10*3/uL (ref 130–400)
PLATELET ESTIMATE, SMEAR: ADEQUATE
POLYS (SEGS)%: 58 %
RDW: 12.7 U (ref 0–14.7)
RED CELL COUNT: 4.28 10*6/uL (ref 4.0–5.2)
WHITE BLOOD CELL COUNT: 4.8 10*3/uL (ref 4.5–11.0)

## 2015-06-28 LAB — HEPATIC FUNCTION PANEL
ALANINE TRANSFERASE (ALT): 47 U/L (ref 5–54)
ALBUMIN: 4.6 g/dL (ref 3.2–4.6)
ALKALINE PHOSPHATASE (ALP): 87 U/L (ref 35–115)
ASPARTATE TRANSAMINASE (AST): 30 U/L (ref 15–43)
BILIRUBIN TOTAL: 0.3 mg/dL (ref 0.3–1.3)
PROTEIN: 7.4 g/dL (ref 6.3–8.3)

## 2015-06-28 MED ORDER — DIAZEPAM 5 MG TABLET
5.0000 mg | ORAL_TABLET | Freq: Four times a day (QID) | ORAL | 0 refills | Status: DC | PRN
  Filled 2015-06-29: qty 30, 8d supply, fill #0

## 2015-06-28 NOTE — Nursing Note (Addendum)
Patient here for Immunotherapy.  Patient and antigen were identified with 2 identifiers, including patient's confirmation of correct antigen bottle(s). Per Allergy protocol the patient's condition was then assessed, which included any immediate or delayed reaction to previous dose, current health status, Peak flow reading if indicated, use of any new meds such as antibiotic or Beta Blocker, if antihistamine had been taken within the previous 24 hours, and length of time since last allergy injection(s).    The allergy injection(s) were then administered per the build up schedule.  Patients dose was repeated.(reduced, repeated, advanced).   Patient received 0.50 mL of 1:1 Aq Tree/Grass/Weed subcutaneous in left arm.( dose, concentration, name of antigen).  Kyrie Fludd C Debra Calabretta, RN    After 30 minutes observation, Patient's injection sites were checked for any local reaction. Patient then left the clinic feeling fine.  Local reaction was 0 (wheal/erythema).  Donovan Persley C Royanne Warshaw, RN

## 2015-06-28 NOTE — Telephone Encounter (Signed)
Prescription printed, signed and ready to fax to pharmacy. Tammey Deeg, MD

## 2015-06-29 ENCOUNTER — Other Ambulatory Visit: Payer: Self-pay

## 2015-06-29 ENCOUNTER — Encounter: Payer: Self-pay | Admitting: Family Medicine

## 2015-06-29 NOTE — Telephone Encounter (Signed)
Duplicate request. Mayela Bullard, M.D.

## 2015-06-29 NOTE — Telephone Encounter (Signed)
Prescription in MD's Rx folder to be signed.  Arno Cullers, MA1

## 2015-06-29 NOTE — Telephone Encounter (Signed)
Prescription signed and ready to fax. [name], MD

## 2015-07-03 ENCOUNTER — Encounter: Payer: Self-pay | Admitting: Family Medicine

## 2015-07-03 MED ORDER — ATORVASTATIN 20 MG TABLET
20.0000 mg | ORAL_TABLET | Freq: Every day | ORAL | 3 refills | Status: DC
  Filled 2015-07-03: qty 30, 30d supply, fill #0
  Filled 2015-08-01: qty 30, 30d supply, fill #1
  Filled 2015-09-12: qty 30, 30d supply, fill #2
  Filled 2015-10-18: qty 30, 30d supply, fill #3

## 2015-07-03 NOTE — Telephone Encounter (Signed)
Records indicate it was faxed. Tiana Loft, [place]

## 2015-07-04 ENCOUNTER — Other Ambulatory Visit: Payer: Self-pay

## 2015-07-12 ENCOUNTER — Ambulatory Visit: Payer: Commercial Managed Care - HMO

## 2015-07-12 DIAGNOSIS — J301 Allergic rhinitis due to pollen: Secondary | ICD-10-CM

## 2015-07-12 NOTE — Nursing Note (Signed)
Patient here for Immunotherapy.  Patient and antigen were identified with 2 identifiers, including patient's confirmation of correct antigen bottle(s). Per Allergy protocol the patient's condition was then assessed, which included any immediate or delayed reaction to previous dose, current health status, Peak flow reading if indicated, use of any new meds such as antibiotic or Beta Blocker, if antihistamine had been taken within the previous 24 hours, and length of time since last allergy injection(s).    The allergy injection(s) were then administered per the build up schedule.  Patients dose was repeated.(reduced, repeated, advanced).   Patient received 0.50 mL of 1:1 Aq Tree/Grass/Weed subcutaneous in right arm.( dose, concentration, name of antigen).  Aava Deland C Patrick Sohm, RN    After 30 minutes observation, Patient's injection sites were checked for any local reaction. Patient then left the clinic feeling fine.  Local reaction was 0 (wheal/erythema).  Alieu Finnigan C Zoltan Genest, RN

## 2015-07-19 ENCOUNTER — Encounter: Payer: Self-pay | Admitting: Family Medicine

## 2015-07-19 ENCOUNTER — Ambulatory Visit: Payer: Commercial Managed Care - HMO | Attending: Family Medicine | Admitting: Family Medicine

## 2015-07-19 ENCOUNTER — Other Ambulatory Visit: Payer: Self-pay

## 2015-07-19 VITALS — BP 130/83 | HR 101 | Temp 97.9°F | Resp 16 | Ht 64.75 in | Wt 141.1 lb

## 2015-07-19 DIAGNOSIS — Z124 Encounter for screening for malignant neoplasm of cervix: Secondary | ICD-10-CM | POA: Insufficient documentation

## 2015-07-19 DIAGNOSIS — Z0000 Encounter for general adult medical examination without abnormal findings: Principal | ICD-10-CM | POA: Insufficient documentation

## 2015-07-19 DIAGNOSIS — K259 Gastric ulcer, unspecified as acute or chronic, without hemorrhage or perforation: Secondary | ICD-10-CM | POA: Insufficient documentation

## 2015-07-19 DIAGNOSIS — R3915 Urgency of urination: Secondary | ICD-10-CM

## 2015-07-19 DIAGNOSIS — R7301 Impaired fasting glucose: Secondary | ICD-10-CM | POA: Insufficient documentation

## 2015-07-19 DIAGNOSIS — Z01419 Encounter for gynecological examination (general) (routine) without abnormal findings: Secondary | ICD-10-CM | POA: Insufficient documentation

## 2015-07-19 DIAGNOSIS — E7800 Pure hypercholesterolemia, unspecified: Secondary | ICD-10-CM | POA: Insufficient documentation

## 2015-07-19 DIAGNOSIS — N852 Hypertrophy of uterus: Secondary | ICD-10-CM | POA: Insufficient documentation

## 2015-07-19 DIAGNOSIS — Z79890 Hormone replacement therapy (postmenopausal): Secondary | ICD-10-CM | POA: Insufficient documentation

## 2015-07-19 DIAGNOSIS — Z23 Encounter for immunization: Secondary | ICD-10-CM | POA: Insufficient documentation

## 2015-07-19 MED ORDER — PROGESTERONE MICRONIZED 200 MG CAPSULE
200.0000 mg | ORAL_CAPSULE | Freq: Every day | ORAL | 11 refills | Status: DC
Start: 2015-07-19 — End: 2016-06-03
  Filled 2015-07-19 – 2015-08-07 (×2): qty 30, 30d supply, fill #0
  Filled 2015-09-08: qty 30, 30d supply, fill #1
  Filled 2015-10-17: qty 30, 30d supply, fill #2
  Filled 2015-11-12: qty 30, 30d supply, fill #3
  Filled 2015-12-14: qty 30, 30d supply, fill #4
  Filled 2016-01-18: qty 30, 30d supply, fill #5
  Filled 2016-02-22: qty 30, 30d supply, fill #6
  Filled 2016-04-01: qty 30, 30d supply, fill #7
  Filled 2016-05-05: qty 30, 30d supply, fill #8

## 2015-07-19 MED ORDER — ESTRADIOL 0.0375 MG/24 HR SEMIWEEKLY TRANSDERMAL PATCH
1.0000 | MEDICATED_PATCH | TRANSDERMAL | 11 refills | Status: DC
  Filled 2015-07-19 – 2015-08-07 (×2): qty 24, 84d supply, fill #0
  Filled 2015-11-21: qty 24, 84d supply, fill #1
  Filled 2016-03-17: qty 24, 84d supply, fill #2

## 2015-07-19 MED ORDER — FAMOTIDINE 40 MG TABLET
40.0000 mg | ORAL_TABLET | Freq: Every day | ORAL | 11 refills | Status: DC
Start: 2015-07-19 — End: 2016-08-07
  Filled 2015-07-19: qty 30, 30d supply, fill #0
  Filled 2015-08-16: qty 30, 30d supply, fill #1
  Filled 2015-10-28: qty 30, 30d supply, fill #2
  Filled 2015-12-03: qty 30, 30d supply, fill #3
  Filled 2016-01-18: qty 30, 30d supply, fill #4
  Filled 2016-02-26: qty 30, 30d supply, fill #5
  Filled 2016-04-09: qty 30, 30d supply, fill #6
  Filled 2016-05-19: qty 30, 30d supply, fill #7
  Filled 2016-06-29: qty 30, 30d supply, fill #8

## 2015-07-19 NOTE — Nursing Note (Signed)
Vital signs taken,tobacco,allergies,pharmacy verified, screened for pain.  Aziah Brostrom, MA

## 2015-07-19 NOTE — Patient Instructions (Signed)
Peptic Ulcer Disease: Care Instructions  Your Care Instructions     Peptic ulcers are sores on the inside of the stomach or the small intestine. They are usually caused by an infection with bacteria or from use of nonsteroidal anti-inflammatory drugs (NSAIDs). NSAIDs include aspirin, ibuprofen (Advil), and naproxen (Aleve).  Your doctor may have prescribed medicine to reduce stomach acid. You also may need to take antibiotics if your peptic ulcers are caused by an infection. You can help your stomach heal and keep ulcers from coming back by making some changes in your lifestyle. Quit smoking, limit caffeine and alcohol, and reduce stress.  Follow-up care is a key part of your treatment and safety. Be sure to make and go to all appointments, and call your doctor if you are having problems. It's also a good idea to know your test results and keep a list of the medicines you take.  How can you care for yourself at home?   Take your medicines exactly as prescribed. Call your doctor if you think you are having a problem with your medicine.   Do not take aspirin or other NSAIDs such as ibuprofen (Advil or Motrin) or naproxen (Aleve). Ask your doctor what you can take for pain.   Do not smoke. Smoking can make ulcers worse. If you need help quitting, talk to your doctor about stop-smoking programs and medicines. These can increase your chances of quitting for good.   Drink in moderation or avoid drinking alcohol.   Do not drink beverages that have caffeine if they bother your stomach. These include coffee, tea, and soda.   Eat a balanced diet of small, frequent meals. Make an appointment with a dietitian if you need help planning your meals.   Reduce stress. Avoid people and places that make you feel anxious, if you can. Learn ways to reduce stress, such as biofeedback, guided imagery, and meditation.  When should you call for help?  Call 911 anytime you think you may need emergency care. For example, call  if:   You passed out (lost consciousness).   You vomit blood or what looks like coffee grounds.   You pass maroon or very bloody stools.  Call your doctor now or seek immediate medical care if:   You have severe pain in your belly, back, or shoulders.   You have new or worsening belly pain.   You are dizzy or lightheaded, or you feel like you may faint.   Your stools are black and tarlike or have streaks of blood.  Watch closely for changes in your health, and be sure to contact your doctor if:   You have new symptoms such as weight loss, nausea or vomiting.   You do not feel better as expected.   Where can you learn more?   Go to https://www.healthwise.net/patiented  Enter Z086 in the search box to learn more about "Peptic Ulcer Disease: Care Instructions."    2006-2015 [employer]. Care instructions adapted under license by [facility]. This care instruction is for use with your licensed healthcare professional. If you have questions about a medical condition or this instruction, always ask your healthcare professional. Healthwise, Incorporated disclaims any warranty or liability for your use of this information.  Content Version: 10.6.465758; Current as of: January 21, 2013

## 2015-07-19 NOTE — Nursing Note (Signed)
Immunization VIS documentation(s) were given to patient to review. All questions were answered and the patient consented to the Immunization(s) being given. Patient allergies were reviewed and no contraindications were found. The immunization(s) were given as ordered. The patient was observed for any immediate reactions to the vaccine. None were observed. Gianne Shugars Pierson, LVN

## 2015-07-19 NOTE — Progress Notes (Signed)
07/19/2015     Chief Complaint   Patient presents with    Well Woman (GYN)     last PAP 2013 with Judi Cong, mammo: 04/2015        SUBJECTIVE: Marisa Jenkins is a 60yr old female who presents today for a routine prevention physical.   Her last physical exam was 1 years ago.   In general she has been feeling well,  Has some night sweats due to menopause,  Denies any hot flashes.  Recently went down on hormone replacement therapy but willing to try going down further.   Has been trying to improve on her diet for elevated glucose and hyperlipidemia but struggling with this.  Has a history of an ulcer in the past.  No       needs zostavax  tdap utd  last mammogram 04/2015 nl  last pap over 4 years ago  hx rectal cancer  last colonoscopy 06/2015 serrated adenoma   repeat in 3 years  Last labs 05/2015      OB/GYN HISTORY: Menstrual history :  Patient's last menstrual period was 10/28/2010.       reports that she has quit smoking. She has never used smokeless tobacco. She reports that she drinks alcohol. She reports that she does not use illicit drugs.   She exercises intermittently about 3 times a week at most.    She is occasionally performing periodic breast self examinations. She has not noted any breast problems.     ROS:   Review of Systems   Constitutional: Negative.  Negative for activity change.   HENT: Negative.    Eyes: Negative.    Respiratory: Negative.    Cardiovascular: Negative.    Gastrointestinal: Negative.    Genitourinary: Positive for frequency and urgency. Negative for decreased urine volume, difficulty urinating, dyspareunia, dysuria, hematuria, menstrual problem and pelvic pain.   Musculoskeletal: Negative.    Allergic/Immunologic: Positive for environmental allergies.   Neurological: Negative.    Hematological: Negative.    Psychiatric/Behavioral: The patient is nervous/anxious.         Worries about history of rectal cancer          PAST MEDICAL HISTORY:   Past Medical History   Diagnosis Date     Allergic rhinitis 04/29/2012    Anxiety disorder 04/29/2012    Elevated liver enzymes     Hyperlipidemia 04/29/2012    Impaired fasting glucose     Internal hemorrhoids 02/27/2006    Menorrhagia     Migraine headache 04/29/2012    Pain in joint of right hand 2009     thumb joint    Postmenopausal HRT (hormone replacement therapy) 04/29/2012    Rectal cancer 11/2014     found in rectal polyp, removed during colonoscopy    Sciatica 2006    Thrombocytopenia 06/11/2006    Tubular adenoma 08/23/2014      Past Surgical History   Procedure Laterality Date    Tonsillectomy      Pr colsc flx w/rmvl of tumor polyp lesion snare tq  04/22/2011     tubular adenoma: recheck 05/2014    Pr lap procedure, unlisted, bladder  2003     bladder repair    Pr removal of leg veins/ulcer  02/2003     vein stripping  Dr. Vira Agar    Pr colsc flx w/rmvl of tumor polyp lesion snare tq  05/18/06     tubular adenoma    Tonsillectomy and adenoidectomy  1962  Biopsy, breast  2008     benign    Biopsy, breast Left 10/2009     benign    Esophagogastroduodenoscopy (egd)  11/24/2014     small antral ulcerations    Colonoscopy  11/24/2014     large pedunculated polyp    Hc upper gi endoscopy,biopsy  11/24/2014    Colonoscopy  11/24/2014    Pr sigmoidoscopy,biopsy  03/16/2015    Flexible sigmoidoscopy  03/16/2015    Colonoscopy  06/22/2015     serrated tubular adenoma; no recurrence of rectal cancerhyperplastic polyp, repeat in 3 years        ALLERGIES:   Allergies   Allergen Reactions    Clindamycin Other-Reaction in Comments     heartburn    Penicillin G Hives     About 2010          Immunization History   Administered Date(s) Administered    Influenza Vaccine, Quadrivalent (Fluzone/Fluarix) 01/04/2013, 12/29/2013, 12/12/2014    Tdap (Boostrix) 03/11/2013, 12/28/2013   Pended Date(s) Pended    Zoster Vaccine (live) 07/19/2015        OBJECTIVE:  BP 130/83  Pulse 101  Temp 36.6 C (97.9 F) (Temporal)  Resp 16  Ht 1.645 m (5'  4.75")  Wt 64 kg (141 lb 1.5 oz)  LMP 10/28/2010  BMI 23.66 kg/m2   Patient's last menstrual period was 10/28/2010.      General Appearance: healthy, alert, no distress, pleasant affect, cooperative.  Eyes:  conjunctivae and corneas clear. PERRL, EOM's intact. sclerae normal.  Ears:  normal TMs and canal and external inspection of ears show no abnormality.  Nose:  normal.  Mouth: normal.  Neck:  Neck supple. No adenopathy, thyroid symmetric, normal size.  Heart:  normal rate and regular rhythm, no murmurs, clicks, or gallops.  Lungs: clear to auscultation.  Breast:  inspection negative. No nipple discharge, bleeding or masses.  Abdomen: BS normal.  Abdomen soft, non-tender.  No masses or organomegaly.  Extremities:  no cyanosis, clubbing, or edema and distal pulses normal.  Skin:  Skin color, texture, turgor normal. No rashes or lesions.  Pelvic:  External genitalia normal, vagina with 2+ cystocele; bimanual exam with mild anterior uterine enlargement and rectovaginal normal, .  Rectal: negative.  Neuro: Gait normal. Reflexes normal and symmetric. Sensation and strength grossly normal.           ASSESSMENT:             (Z00.00) Routine general medical examination at a health care facility  (primary encounter diagnosis)  Comment:   Plan: ZOSTER VACCINE (LIVE), CYTOLOGY GYN, HPV DNA,         CYTOLOGY GYN, HPV DNA            (Z79.890) Postmenopausal HRT (hormone replacement therapy)  Comment: taper down/ off.  Reviewed potential side effects and recommendation to taper off.   Plan: Estradiol Biweekly 0.0375 mg/24 hr         (VIVELLE-DOT) Patch, Progesterone Micronized         (PROMETRIUM) 200 mg capsule            (E78.00) Pure hypercholesterolemia  Comment:   Plan: LIPID PANEL            (R73.01) Impaired fasting glucose  Comment:   Plan: BASIC METABOLIC PANEL, HEMOGLOBIN A1C            (R39.15) Urgency of urination  Comment:   Plan: has had Burch procedure in the past.  Symptoms  are mild.  She declined any treatment  or evaluation for this currently.    (K25.9) Gastric ulcer, unspecified chronicity  Comment: low acid diet recommended.  Plan: FamoTIDine (PEPCID) 40 mg Tablet            (N85.2) Bulky or enlarged uterus  Comment:   Plan: US PELVIS, TRANSABDOMINAL, NON-OB, COMPLETE        Advised to decrease/ taper off hormone replacement therapy.         Prevention counseling: The patient was counseled regarding routine female prevention issues to include breast self examination, annual mammograms. She was also counseled regarding screening for hyperlipidemia, healthy diet and exercise, routine immunizations, sun protection and routine examinations. A Gynecologic Cancers pamphlet was  provided to the patient. Counselling in regards to timing for colon cancer screening and types of available screening discussed. Self care measures discussed include skin care, dental hygiene and cleanings, seatbelt and helmet use.     Advised annual follow up exam.    I reviewed the patient's past medical and family/social history, and updated problem list and past medical history .  Barriers to Learning assessed: none. Patient verbalizes understanding of teaching and instructions.  [name], MD  Family Practice, [facility]

## 2015-07-24 LAB — HPV DNA
HPV HIGH RISK GROUP: NEGATIVE
HPV TYPE 16: NEGATIVE
HPV TYPE 18: NEGATIVE

## 2015-07-31 ENCOUNTER — Ambulatory Visit: Payer: Commercial Managed Care - HMO

## 2015-07-31 DIAGNOSIS — J301 Allergic rhinitis due to pollen: Secondary | ICD-10-CM

## 2015-07-31 NOTE — Nursing Note (Signed)
Patient here for Immunotherapy.  Patient and antigen were identified with 2 identifiers, including patient's confirmation of correct antigen bottle(s). Per Allergy protocol the patient's condition was then assessed, which included any immediate or delayed reaction to previous dose, current health status, Peak flow reading if indicated, use of any new meds such as antibiotic or Beta Blocker, if antihistamine had been taken within the previous 24 hours, and length of time since last allergy injection(s).    The allergy injection(s) were then administered per the build up schedule.  Patients dose was repeated.(reduced, repeated, advanced).   Patient received 0.50 mL of 1:1 Aq Tree/Grass/Weed subcutaneous in left arm.( dose, concentration, name of antigen).  Suzan Slick, RN    After 30 minutes observation, Patient's injection sites were checked for any local reaction. Patient then left the clinic feeling fine.  Local reaction was 3/3 (wheal/erythema).  Suzan Slick, RN

## 2015-08-01 ENCOUNTER — Other Ambulatory Visit: Payer: Self-pay

## 2015-08-05 LAB — CYTOLOGY GYN

## 2015-08-07 ENCOUNTER — Other Ambulatory Visit: Payer: Self-pay

## 2015-08-08 ENCOUNTER — Other Ambulatory Visit: Payer: Self-pay

## 2015-08-16 ENCOUNTER — Other Ambulatory Visit: Payer: Self-pay

## 2015-08-30 ENCOUNTER — Ambulatory Visit: Payer: Commercial Managed Care - HMO

## 2015-08-30 DIAGNOSIS — J301 Allergic rhinitis due to pollen: Secondary | ICD-10-CM

## 2015-08-30 NOTE — Nursing Note (Signed)
Patient here for Immunotherapy.  Patient and antigen were identified with 2 identifiers, including patient's confirmation of correct antigen bottle(s). Per Allergy protocol the patient's condition was then assessed, which included any immediate or delayed reaction to previous dose, current health status, Peak flow reading if indicated, use of any new meds such as antibiotic or Beta Blocker, if antihistamine had been taken within the previous 24 hours, and length of time since last allergy injection(s).    The allergy injection(s) were then administered per the build up schedule.  Patients dose was decreased as it has been 31 days since her last injection.(reduced, repeated, advanced).   Patient received 0.40 mL of 1:1 Aq Tree/Grass/Weed subcutaneous in right arm.( dose, concentration, name of antigen).  Suzan Slick, RN    After 30 minutes observation, Patient's injection sites were checked for any local reaction. Patient then left the clinic feeling fine.  Local reaction was 0 (wheal/erythema).  Suzan Slick, RN

## 2015-09-10 ENCOUNTER — Other Ambulatory Visit: Payer: Self-pay

## 2015-09-12 ENCOUNTER — Other Ambulatory Visit: Payer: Self-pay

## 2015-09-13 ENCOUNTER — Ambulatory Visit: Payer: Commercial Managed Care - HMO

## 2015-09-13 DIAGNOSIS — J301 Allergic rhinitis due to pollen: Secondary | ICD-10-CM

## 2015-09-13 NOTE — Nursing Note (Addendum)
Patient here for Immunotherapy.  Patient and antigen were identified with 2 identifiers, including patient's confirmation of correct antigen bottle(s). Per Allergy protocol the patient's condition was then assessed, which included any immediate or delayed reaction to previous dose, current health status, Peak flow reading if indicated, use of any new meds such as antibiotic or Beta Blocker, if antihistamine had been taken within the previous 24 hours, and length of time since last allergy injection(s).    The allergy injection(s) were then administered per the build up schedule.  Patients dose was advanced.(reduced, repeated, advanced).   Patient received 0.50 mL of 1:1 Aq Tree/Grass/Weed subcutaneous in left arm.( dose, concentration, name of antigen).  Suzan Slick, RN    After 30 minutes observation, Patient's injection sites were checked for any local reaction. Patient then left the clinic feeling fine.  Local reaction was 0 (wheal/erythema).  Suzan Slick, RN

## 2015-09-28 ENCOUNTER — Other Ambulatory Visit: Payer: Self-pay

## 2015-10-04 ENCOUNTER — Ambulatory Visit: Payer: Commercial Managed Care - HMO

## 2015-10-04 DIAGNOSIS — J301 Allergic rhinitis due to pollen: Secondary | ICD-10-CM

## 2015-10-04 NOTE — Nursing Note (Addendum)
Patient here for Immunotherapy.  Patient and antigen were identified with 2 identifiers, including patient's confirmation of correct antigen bottle(s). Per Allergy protocol the patient's condition was then assessed, which included any immediate or delayed reaction to previous dose, current health status, Peak flow reading if indicated, use of any new meds such as antibiotic or Beta Blocker, if antihistamine had been taken within the previous 24 hours, and length of time since last allergy injection(s).    The allergy injection(s) were then administered per the build up schedule.  Patients dose was repeated.(reduced, repeated, advanced).   Patient received in Rt. Arm:  0.50 mL of 1:1 Aq Tree/Grass/Weed subcutaneous.  Suzan Slick, RN    After 30 minutes observation, Patient's injection sites were checked for any local reaction. Patient then left the clinic feeling fine.  Local reaction was 0 (wheal/erythema).  Suzan Slick, RN

## 2015-10-05 ENCOUNTER — Ambulatory Visit: Payer: Commercial Managed Care - HMO | Admitting: Family Medicine

## 2015-10-09 ENCOUNTER — Ambulatory Visit: Payer: Commercial Managed Care - HMO | Admitting: Family Medicine

## 2015-10-09 ENCOUNTER — Other Ambulatory Visit: Payer: Self-pay

## 2015-10-09 ENCOUNTER — Encounter: Payer: Self-pay | Admitting: Family Medicine

## 2015-10-09 VITALS — BP 121/81 | HR 95 | Temp 97.6°F | Resp 16 | Wt 134.0 lb

## 2015-10-09 DIAGNOSIS — M533 Sacrococcygeal disorders, not elsewhere classified: Secondary | ICD-10-CM

## 2015-10-09 DIAGNOSIS — R002 Palpitations: Secondary | ICD-10-CM

## 2015-10-09 DIAGNOSIS — M7062 Trochanteric bursitis, left hip: Secondary | ICD-10-CM

## 2015-10-09 DIAGNOSIS — M7061 Trochanteric bursitis, right hip: Secondary | ICD-10-CM

## 2015-10-09 DIAGNOSIS — J209 Acute bronchitis, unspecified: Principal | ICD-10-CM

## 2015-10-09 MED ORDER — PROMETHAZINE-DM 6.25 MG-15 MG/5 ML ORAL SYRUP
5.0000 mL | ORAL_SOLUTION | Freq: Four times a day (QID) | ORAL | 0 refills | Status: DC | PRN
Start: 2015-10-09 — End: 2015-10-17
  Filled 2015-10-09: qty 120, 6d supply, fill #0

## 2015-10-09 MED ORDER — ALBUTEROL SULFATE HFA 90 MCG/ACTUATION AEROSOL INHALER
1.0000 | INHALATION_SPRAY | RESPIRATORY_TRACT | 2 refills | Status: DC | PRN
Start: 2015-10-09 — End: 2016-03-17
  Filled 2015-10-09: qty 8.5, 30d supply, fill #0

## 2015-10-09 NOTE — Nursing Note (Signed)
Vital signs taken,tobacco,allergies,pharmacy verified, screened for pain.  Barrie Wale, MA

## 2015-10-09 NOTE — Patient Instructions (Addendum)
Hip Bursitis: Care Instructions  Your Care Instructions     Bursitis is inflammation of the bursa. A bursa is a small sac of fluid that cushions a joint and helps it move easily. A bursa sits between a bone in the hip and the muscles and tendons in the thigh and buttock. Injury or overuse of the hip can cause bursitis. Activities that can lead to bursitis include twisting and rapid joint movement. Bursitis can cause hip pain.  Bursitis usually gets better if you avoid the activity that caused it. If pain lasts or gets worse despite home treatment, your doctor may draw fluid from the bursa through a needle. This may relieve your pain and help your doctor know if you have an infection. If so, your doctor will prescribe antibiotics. If you have inflammation only, you may get a corticosteroid shot to reduce swelling and pain. Sometimes surgery is needed to drain or remove the bursa.  Follow-up care is a key part of your treatment and safety. Be sure to make and go to all appointments, and call your doctor if you are having problems. It's also a good idea to know your test results and keep a list of the medicines you take.  How can you care for yourself at home?   Put ice or a cold pack on your hip for 10 to 20 minutes at a time. Put a thin cloth between the ice and your skin.   After 3 days of using ice, you may use heat on your hip. You can use a hot water bottle, a heating pad set on low, or a warm, moist towel.   Rest your hip. Stop any activities that cause pain. Switch to activities that do not stress your hip.   Take your medicines exactly as prescribed. Call your doctor if you think you are having a problem with your medicine.   Ask your doctor if you can take an over-the-counter pain medicine, such as acetaminophen (Tylenol), ibuprofen (Advil, Motrin), or naproxen (Aleve). Be safe with medicines. Read and follow all instructions on the label.   To prevent stiffness, gently move the hip joint as much as  you can without pain every day. As the pain gets better, keep doing range-of-motion exercises. Ask your doctor for exercises that will make the muscles around the hip joint stronger. Do these as directed.   You can slowly return to the activity that caused the pain, but do it with less effort until you can do it without pain or swelling. Be sure to warm up before and stretch after you do the activity.  When should you call for help?  Call your doctor now or seek immediate medical care if:   You have a fever.   You have increased swelling or redness in your hip.   You cannot use your hip, or the pain in your hip gets worse.  Watch closely for changes in your health, and be sure to contact your doctor if:   You have pain for 2 weeks or longer despite home treatment.   Where can you learn more?   Go to https://www.healthwise.net/patiented  Enter I156 in the search box to learn more about "Hip Bursitis: Care Instructions."    2006-2015 [employer]. Care instructions adapted under license by [facility]. This care instruction is for use with your licensed healthcare professional. If you have questions about a medical condition or this instruction, always ask your healthcare professional. [employer], [employer] disclaims any   warranty or liability for your use of this information.  Content Version: 10.6.465758; Current as of: Jul 29, 2013              Hip Bursitis: Exercises  Your Care Instructions  Here are some examples of typical rehabilitation exercises for your condition. Start each exercise slowly. Ease off the exercise if you start to have pain.  Your doctor or physical therapist will tell you when you can start these exercises and which ones will work best for you.  How to do the exercises  Hip rotator stretch    1. Lie on your back with both knees bent and your feet flat on the floor.  2. Put the ankle of your affected leg on your opposite thigh near your knee.  3. Use your hand to  gently push your knee away from your body until you feel a gentle stretch around your hip.  4. Hold the stretch for 15 to 30 seconds.  5. Repeat 2 to 4 times.  6. Repeat steps 1 through 5, but this time use your hand to gently pull your knee toward your opposite shoulder.  Iliotibial band stretch    1. Lean sideways against a wall. If you are not steady on your feet, hold on to a chair or counter.  2. Stand on the leg with the affected hip, with that leg close to the wall. Then cross your other leg in front of it.  3. Let your affected hip drop out to the side of your body and against wall. Then lean away from your affected hip until you feel a stretch.  4. Hold the stretch for 15 to 30 seconds.  5. Repeat 2 to 4 times.  Straight-leg raises to the outside    1. Lie on your side, with your affected hip on top.  2. Tighten the front thigh muscles of your top leg to keep your knee straight.  3. Keep your hip and your leg straight in line with the rest of your body, and keep your knee pointing forward. Do not drop your hip back.  4. Lift your top leg straight up toward the ceiling, about 12 inches off the floor. Hold for about 6 seconds, then slowly lower your leg.  5. Repeat 8 to 12 times.  Clamshell    1. Lie on your side, with your affected hip on top and your head propped on a pillow. Keep your feet and knees together and your knees bent.  2. Raise your top knee, but keep your feet together. Do not let your hips roll back. Your legs should open up like a clamshell.  3. Hold for 6 seconds.  4. Slowly lower your knee back down. Rest for 10 seconds.  5. Repeat 8 to 12 times.  Follow-up care is a key part of your treatment and safety. Be sure to make and go to all appointments, and call your doctor if you are having problems. It's also a good idea to know your test results and keep a list of the medicines you take.   Where can you learn more?   Go to https://www.healthwise.net/patiented  Enter H674 in the search box to  learn more about "Hip Bursitis: Exercises."    2006-2015 [employer]. Care instructions adapted under license by [facility]. This care instruction is for use with your licensed healthcare professional. If you have questions about a medical condition or this instruction, always ask your healthcare professional. [employer]   disclaims any warranty or liability for your use of this information.  Content Version: 10.6.465758; Current as of: Jul 29, 2013              Sacroiliac Joint Pain: Care Instructions  Your Care Instructions     The sacroiliac joints connect the spine and each side of the pelvis. These joints bear the weight and stress of your torso. This makes them easy to injure. Injury or overuse of these joints may cause low back pain.  Stress on these joints can cause joint pain. Sacroiliac joint pain is more common in pregnant women. Certain kinds of arthritis also may cause this type of joint pain.  Home treatment may help you feel better. So can avoiding activities that stress your back. Your doctor also may recommend physical therapy. This may include doing exercises and stretches to help with pain. You may also learn to use good posture.  Follow-up care is a key part of your treatment and safety. Be sure to make and go to all appointments, and call your doctor if you are having problems. It's also a good idea to know your test results and keep a list of the medicines you take.  How can you care for yourself at home?   Ask your doctor about light exercises that may help your back pain. Try to do light activity throughout the day. But make sure to take rests as needed. Find a comfortable position for rest, but don't stay in one position for too long. Avoid activities that cause pain.   To apply heat, put a warm water bottle, a heating pad set on low, or a warm cloth on your back. Do not go to sleep with a heating pad on your skin.   Put ice or a cold pack on your  back for 10 to 20 minutes at a time. Put a thin cloth between the ice and your skin.   If the doctor gave you a prescription medicine for pain, take it as prescribed.   If you are not taking a prescription pain medicine, ask your doctor if you can take an over-the-counter pain medicine, such as acetaminophen (Tylenol), ibuprofen (Advil, Motrin), or naproxen (Aleve). Read and follow all instructions on the label. Take pain medicines exactly as directed.   Do not take two or more pain medicines at the same time unless the doctor told you to. Many pain medicines have acetaminophen, which is Tylenol. Too much acetaminophen (Tylenol) can be harmful.   To prevent future back pain, do exercises to stretch and strengthen your back and stomach. Learn how to use good posture, safe lifting techniques, and proper body mechanics.  When should you call for help?  Call 911 anytime you think you may need emergency care. For example, call if:   You are unable to move a leg at all.  Call your doctor now or seek immediate medical care if:   You have new or worse symptoms in your legs or buttocks. Symptoms may include:   Numbness or tingling.   Weakness.   Pain.   You lose bladder or bowel control.  Watch closely for changes in your health, and be sure to contact your doctor if:   You are not getting better as expected.   Where can you learn more?   Go to http://blackburn.com/  Enter Y571 in the search box to learn more about "Sacroiliac Joint Pain: Care Instructions."    2006-2015 [employer]. Care instructions adapted under  license by [facility]. This care instruction is for use with your licensed healthcare professional. If you have questions about a medical condition or this instruction, always ask your healthcare professional. [employer] any warranty or liability for your use of this information.  Content Version: 10.6.465758; Current as of: Jul 29, 2013              Sacroiliac Pain: Exercises  Your Care Instructions  Here are some examples of typical rehabilitation exercises for your condition. Start each exercise slowly. Ease off the exercise if you start to have pain.  Your doctor or physical therapist will tell you when you can start these exercises and which ones will work best for you.  How to do the exercises  Knee-to-chest stretch    Do not do the knee-to-chest exercise if it causes or increases back or leg pain.  7. Lie on your back with your knees bent and your feet flat on the floor. You can put a small pillow under your head and neck if it is more comfortable.  8. Grasp your hands under one knee and bring the knee to your chest, keeping the other foot flat on the floor.  9. Keep your lower back pressed to the floor. Hold for at least 15 to 30 seconds.  10. Relax and lower the knee to the starting position. Repeat with the other leg.  11. Repeat 2 to 4 times with each leg.  12. To get more stretch, keep your other leg flat on the floor while pulling your knee to your chest.  Bridging    6. Lie on your back with both knees bent. Your knees should be bent about 90 degrees.  7. Tighten your belly muscles by pulling in your belly button toward your spine. Then push your feet into the floor, squeeze your buttocks, and lift your hips off the floor until your shoulders, hips, and knees are all in a straight line.  8. Hold for about 6 seconds as you continue to breathe normally, and then slowly lower your hips back down to the floor and rest for up to 10 seconds.  9. Repeat 8 to 12 times.  Hip extension    6. Get down on your hands and knees on the floor.  7. Keeping your back and neck straight, lift one leg straight out behind you. When you lift your leg, keep your hips level. Don't let your back twist, and don't let your hip drop toward the floor.  8. Hold for 6 seconds. Repeat 8 to 12 times with each leg.  9. If you feel steady and strong when you do this  exercise, you can make it more difficult. To do this, when you lift your leg, also lift the opposite arm straight out in front of you. For example, lift the left leg and the right arm at the same time. (This is sometimes called the "bird dog exercise.") Hold for 6 seconds, and repeat 8 to 12 times on each side.  Clamshell    6. Lie on your side with a pillow under your head. Keep your feet and knees together and your knees bent.  7. Raise your top knee, but keep your feet together. Do not let your hips roll back. Your legs should open up like a clamshell.  8. Hold for 6 seconds.  9. Slowly lower your knee back down. Rest for 10 seconds.  10. Repeat 8 to 12 times.  11.  Switch to your other side and repeat steps 1 through 5.  Hamstring wall stretch    1. Lie on your back in a doorway, with one leg through the open door.  2. Slide your affected leg up the wall to straighten your knee. You should feel a gentle stretch down the back of your leg.   Do not arch your back.   Do not bend either knee.   Keep one heel touching the floor and the other heel touching the wall. Do not point your toes.  3. Hold the stretch for at least 1 minute to begin. Then try to lengthen the time you hold the stretch to as long as 6 minutes.  4. Switch legs, and repeat steps 1 through 3.  5. Repeat 2 to 4 times.  If you do not have a place to do this exercise in a doorway, there is another way to do it:  1. Lie on your back, and bend one knee.  2. Loop a towel under the ball and toes of that foot, and hold the ends of the towel in your hands.  3. Straighten your knee, and slowly pull back on the towel. You should feel a gentle stretch down the back of your leg.  4. Switch legs, and repeat steps 1 through 3.  5. Repeat 2 to 4 times.  Lower abdominal strengthening    1. Lie on your back with your knees bent and your feet flat on the floor.  2. Tighten your belly muscles by pulling your belly button in toward your spine.  3. Lift one foot off  the floor and bring your knee toward your chest, so that your knee is straight above your hip and your leg is bent like the letter "L."  4. Lift the other knee up to the same position.  5. Lower one leg at a time to the starting position.  6. Keep alternating legs until you have lifted each leg 8 to 12 times.  7. Be sure to keep your belly muscles tight and your back still as you are moving your legs. Be sure to breathe normally.  Piriformis stretch    1. Lie on your back with your legs straight.  2. Lift your affected leg, and bend your knee. With your opposite hand, reach across your body, and then gently pull your knee toward your opposite shoulder.  3. Hold the stretch for 15 to 30 seconds.  4. Switch legs and repeat steps 1 through 3.  5. Repeat 2 to 4 times.  Follow-up care is a key part of your treatment and safety. Be sure to make and go to all appointments, and call your doctor if you are having problems. It's also a good idea to know your test results and keep a list of the medicines you take.   Where can you learn more?   Go to http://blackburn.com/  Enter [phone] in the search box to learn more about "Sacroiliac Pain: Exercises."    2006-2015 [employer]. Care instructions adapted under license by [facility]. This care instruction is for use with your licensed healthcare professional. If you have questions about a medical condition or this instruction, always ask your healthcare professional. [place] any warranty or liability for your use of this information.  Content Version: 10.6.465758; Current as of: Jul 29, 2013              Palpitations: Care Instructions  Your Care Instructions  Heart palpitations are the uncomfortable sensation that your heart is beating fast or irregularly. You might feel pounding or fluttering in your chest. It might feel like your heart is skipping a beat.  Although palpitations may be caused by a heart  problem, they also occur because of stress, fatigue, or use of alcohol, caffeine, or nicotine. Many medicines, including diet pills, antihistamines, decongestants, and some herbal products, can cause heart palpitations. Nearly everyone has palpitations from time to time.  Depending on your symptoms, your doctor may need to do more tests to try to find the cause of your palpitations.  Follow-up care is a key part of your treatment and safety. Be sure to make and go to all appointments, and call your doctor if you are having problems. It's also a good idea to know your test results and keep a list of the medicines you take.  How can you care for yourself at home?   Avoid caffeine, nicotine, and excess alcohol.   Do not take illegal drugs, such as methamphetamines and cocaine.   Do not take weight loss or diet medicines unless you talk with your doctor first.   Get plenty of sleep.   Do not overeat.   If you have palpitations again, take deep breaths and try to relax. This may slow a racing heart.   If you start to feel lightheaded, lie down to avoid injuries that might result if you pass out and fall down.   Keep a record of your palpitations and bring it to your next doctor's appointment. Write down:   The date and time.   Your pulse. (If your heart is beating fast, it may be hard to count your pulse.)   What you were doing when the palpitations started.   How long the palpitations lasted.   Any other symptoms.   If an activity causes palpitations, slow down or stop. Talk to your doctor before you do that activity again.   Take your medicines exactly as prescribed. Call your doctor if you think you are having a problem with your medicine.  When should you call for help?  Call 911 anytime you think you may need emergency care. For example, call if:   You passed out (lost consciousness).   You have symptoms of a heart attack. These may include:   Chest pain or pressure, or a strange feeling in the  chest.   Sweating.   Shortness of breath.   Pain, pressure, or a strange feeling in the back, neck, jaw, or upper belly or in one or both shoulders or arms.   Lightheadedness or sudden weakness.   A fast or irregular heartbeat.  After you call 911, the operator may tell you to chew 1 adult-strength or 2 to 4 low-dose aspirin. Wait for an ambulance. Do not try to drive yourself.   You have signs of a stroke. These may include:   Sudden numbness, paralysis, or weakness in your face, arm, or leg, especially on only one side of your body.   New problems with walking or balance.   Sudden vision changes.   Drooling or slurred speech.   New problems speaking or understanding simple statements, or feeling confused.   A sudden, severe headache that is different from past headaches.  Call your doctor now or seek immediate medical care if:   You have heart palpitations and:   Are dizzy or lightheaded, or you feel like you may faint.   Have new or  increased shortness of breath.  Watch closely for changes in your health, and be sure to contact your doctor if:   You continue to have heart palpitations.   Where can you learn more?   Go to http://blackburn.com/  Enter R508 in the search box to learn more about "Palpitations: Care Instructions."    2006-2015 [employer], [employer]. Care instructions adapted under license by [facility]. This care instruction is for use with your licensed healthcare professional. If you have questions about a medical condition or this instruction, always ask your healthcare professional. [place] any warranty or liability for your use of this information.  Content Version: 10.6.465758; Current as of: June 30, 2013

## 2015-10-10 LAB — POC ELECTROCARDIOGRAM WITH RHYTHM STRIP: QTC: 448

## 2015-10-10 NOTE — Progress Notes (Signed)
.  10/11/2015   EPIC FAMILY MEDICINE VISIT: MRN# [id number]  Name: Marisa Jenkins  Date of birth: 1956/02/08  Primary care physician is [name], MD     Chief Complaint   Patient presents with    Hip Pain     left posterior and bilat lateral    Upper Respiratory Infection     Friday,  green phlegm    Palpitations     fast lasts up to 15 mins         SUBJECTIVE:  Marisa Jenkins is a 60yr old female who is here for an ongoing problem of less posterior hip pain, and bilateral lateral hip pain. The main problem is in the back of her left hip, in the sacroiliac area, and it hurts when she steps down, especially if she is trying to do any hiking, or walking on uneven surface. She denies any radiation down the leg, and has not had any numbness, tingling, or weakness. She has tried taking ibuprofen, warm compresses, and gentle massage to the area. She also has had some upper respiratory symptoms with green phlegm, and congestion though she denies any sinus pain, wheezing, or shortness of breath she requests a cough syrup since the cough is keeping her awake at night. She has not had any fever. She has tried taking emergency C, zinc lozenges, and Robitussin-DM which did not seem to help. She has had some wheezing, which she has never had in the past. She also complains of a new problem of intermittent heart palpitations which feels like her heart rate is higher than usual, and it lasts for about 15 minutes. She thinks it might be related to anxiety, but she is not sure. She has never had this problem in the past. She is concerned about the wheezing. She denies any chest pain, dizziness, or syncope. It does not seem to be related to her inner ear.          Patient Active Problem List   Diagnosis    Hyperlipidemia    Postmenopausal HRT (hormone replacement therapy)    Migraine headache    Allergic rhinitis    Anxiety disorder    Impaired fasting glucose    Pain in joint of right hand    Internal hemorrhoids     Allergic rhinitis due to pollen    Cerumen impaction    Injury of right foot    Libido, decreased    Pain, abdominal    Tubular adenoma    Polyp of colon    Elevated liver enzymes    Rectal cancer       REVIEW of SYSTEMS :  REVIEW of SYSTEMS   Constitutional: fatigue.  Eyes: blurry vision.  Ears, Nose, Mouth, Throat: persistent sore throat, post nasal drip, rhinorrhea.  CV: palpitations.  Resp: wheezing  GI: negative.  GU: negative.  Musculoskeletal: muscle pain, joint pain in left low back and bilateral lateral hips  Integumentary: negative.  Neuro: mild headache.  Psych: Mood pt's report, euthymic.       OBJECTIVE:  Vital signs:    BP 121/81  Pulse 95  Temp 36.4 C (97.6 F) (Temporal)  Resp 16  Wt 60.8 kg (134 lb 0.6 oz)  LMP 10/28/2010  SpO2 97%  BMI 22.48 kg/m2   General Appearance: fatigued appearing with mild nasal congestion, alert, no distress, pleasant affect, cooperative.  Eyes:  conjunctivae and corneas clear. PERRL, EOM's intact. sclerae normal.  Ears:  normal TMs and  canal and external inspection of ears show no abnormality.  Nose:  mucosa swollen, pale, and boggy, clear rhinorrhea.  Mouth: mild erythema.  Neck:  moderate anterior cervical nodes.  Heart:  normal rate and regular rhythm, no murmurs, clicks, or gallops.  Lungs: mild wheezing left anteriorly and right posteriorly no respiratory distress at rest.  Extremities:  no cyanosis, clubbing, or edema and distal pulses normal.  Skin:  Skin color, texture, turgor normal. No rashes or lesions.  Musculoskeletal: back:Back symmetric, no curvature. ROM normal. No CVA tenderness.  But has left  Sacroiliac joint tenderness to palpation with pain on rotation of hip.  Ext: no clubbing, cyanosis, or edema, distal pulses intact  HIP exam shows mild tenderness laterally over the head of the greater trochanter, no deformity, ecchymosis, mild pain on motion: only with crossing the right leg over the left and vice versa, no contracture, full  flexion, internal rotation is noted.        ASSESSMENT:  (J20.9) Bronchitis, acute, with bronchospasm  (primary encounter diagnosis)  Comment:   Plan: O2 SATURATION, Albuterol (PROAIR HFA, PROVENTIL        HFA, VENTOLIN HFA) 90 mcg/actuation inhaler            (M53.3) Pain of left sacroiliac joint  Comment: icing, stretching,   Plan: given exercises to try.  Consider physical therapy    (M70.61,  M70.62) Trochanteric bursitis of both hips  Comment:   Plan: see pt instructions    (R00.2) Heart palpitations  Comment: normal EKG/ no worrisome changes  Plan: POC ELECTROCARDIOGRAM WITH RHYTHM STRIP, HOLTER        MONITOR; avoid caffeinated beverages and butalbital            Return to clinic: after results are done and for follow up for back and hip pain.   Advised to call back if symptoms fail to improve as expected or worsen.     Total visit time: 30 minutes, of which  50 % was spent face-to-face counseling about the following diagnoses/ health problems:    1. Bronchitis, acute, with bronchospasm    2. Pain of left sacroiliac joint    3. Trochanteric bursitis of both hips    4. Heart palpitations       Barriers to Learning assessed: none. Patient verbalizes understanding of teaching and instructions and agrees with the plan of care.    I reviewed the patient's past medical and family/social history, and updated problem list and past medical history.  Barriers to Learning assessed: none. Patient verbalizes understanding of teaching and instructions.  [name], MD  Family Practice, [facility]  [facility]     This document has been created using Dragon Naturally Speaking and has been checked for content. Some voice recognition errors may have been missed inadvertantly.

## 2015-10-17 ENCOUNTER — Other Ambulatory Visit: Payer: Self-pay | Admitting: Family Medicine

## 2015-10-17 ENCOUNTER — Other Ambulatory Visit: Payer: Self-pay

## 2015-10-18 ENCOUNTER — Other Ambulatory Visit: Payer: Self-pay

## 2015-10-18 ENCOUNTER — Encounter: Payer: Self-pay | Admitting: Student in an Organized Health Care Education/Training Program

## 2015-10-19 ENCOUNTER — Other Ambulatory Visit: Payer: Self-pay

## 2015-10-19 MED ORDER — PROMETHAZINE-DM 6.25 MG-15 MG/5 ML ORAL SYRUP
5.0000 mL | ORAL_SOLUTION | Freq: Four times a day (QID) | ORAL | 0 refills | Status: DC | PRN
Start: 2015-10-19 — End: 2016-06-03
  Filled 2015-10-19: qty 118, 6d supply, fill #0

## 2015-10-25 ENCOUNTER — Ambulatory Visit: Payer: Commercial Managed Care - HMO

## 2015-10-25 DIAGNOSIS — J301 Allergic rhinitis due to pollen: Secondary | ICD-10-CM

## 2015-10-25 DIAGNOSIS — R002 Palpitations: Secondary | ICD-10-CM

## 2015-10-25 NOTE — Nursing Note (Signed)
Holter Monitor was placed per Dr Sharen Counter, Order # [id number]. Patient verbalized good understanding of all instructions given and will return holter monitor # [id number] and diary to [facility] clinic after 24 hours. Willow Ora, LVN

## 2015-10-25 NOTE — Nursing Note (Signed)
Patient here for Immunotherapy.  Patient and antigen were identified with 2 identifiers, including patient's confirmation of correct antigen bottle(s). Per Allergy protocol the patient's condition was then assessed, which included any immediate or delayed reaction to previous dose, current health status, Peak flow reading if indicated, use of any new meds such as antibiotic or Beta Blocker, if antihistamine had been taken within the previous 24 hours, and length of time since last allergy injection(s).    The allergy injection(s) were then administered per the build up schedule.  Patients dose was repeated.(reduced, repeated, advanced).   Patient received in left arm:  0.50 mL of 1:1 Aq Tree/Grass/Weed subcutaneous.  Illyanna Petillo C Daryana Whirley, RN    After 30 minutes observation, Patient's injection sites were checked for any local reaction. Patient then left the clinic feeling fine.  Local reaction was 0 (wheal/erythema).  Zarahi Fuerst C Jaydyn Bozzo, RN

## 2015-10-29 ENCOUNTER — Telehealth: Payer: Self-pay | Admitting: Family Medicine

## 2015-10-29 ENCOUNTER — Other Ambulatory Visit: Payer: Self-pay | Admitting: Family Medicine

## 2015-10-29 ENCOUNTER — Other Ambulatory Visit: Payer: Self-pay

## 2015-10-29 NOTE — Telephone Encounter (Signed)
Holter downloaded and diary faxed to heart center. Kenna Kirn Pierson, LVN

## 2015-10-29 NOTE — Telephone Encounter (Signed)
Patient returned holter with diary. Placed at Nurse station at [facility].     Jo Ena Christensen  [facility]

## 2015-10-30 ENCOUNTER — Other Ambulatory Visit: Payer: Self-pay

## 2015-10-30 MED ORDER — DIAZEPAM 5 MG TABLET
5.0000 mg | ORAL_TABLET | Freq: Four times a day (QID) | ORAL | 0 refills | Status: DC | PRN
  Filled 2015-10-30: qty 30, 8d supply, fill #0

## 2015-10-30 MED ORDER — BUTALBITAL-ASPIRIN-CAFFEINE 50 MG-325 MG-40 MG CAPSULE
1.0000 | ORAL_CAPSULE | Freq: Four times a day (QID) | ORAL | 1 refills | Status: DC | PRN
  Filled 2015-10-30: qty 30, 8d supply, fill #0
  Filled 2016-02-26: qty 30, 8d supply, fill #1

## 2015-10-31 ENCOUNTER — Other Ambulatory Visit: Payer: Self-pay

## 2015-11-02 ENCOUNTER — Ambulatory Visit
Admission: RE | Admit: 2015-11-02 | Discharge: 2015-11-02 | Disposition: A | Discharge status: Home / Self Care | Payer: Commercial Managed Care - HMO | Source: Ambulatory Visit | Attending: Cardiovascular Disease | Admitting: Cardiovascular Disease

## 2015-11-02 DIAGNOSIS — R002 Palpitations: Principal | ICD-10-CM | POA: Insufficient documentation

## 2015-11-02 NOTE — Procedures (Signed)
Holter study for patient Marisa Jenkins was scanned and will be sent to physician's Cardiofile box (Holter system) for review and final interpretation.

## 2015-11-05 DIAGNOSIS — I4949 Other premature depolarization: Secondary | ICD-10-CM

## 2015-11-05 DIAGNOSIS — I493 Ventricular premature depolarization: Secondary | ICD-10-CM | POA: Insufficient documentation

## 2015-11-05 HISTORY — DX: Ventricular premature depolarization: I49.3

## 2015-11-09 ENCOUNTER — Encounter: Payer: Self-pay | Admitting: Family Medicine

## 2015-11-13 ENCOUNTER — Other Ambulatory Visit: Payer: Self-pay

## 2015-11-15 ENCOUNTER — Ambulatory Visit: Payer: Commercial Managed Care - HMO

## 2015-11-15 DIAGNOSIS — J301 Allergic rhinitis due to pollen: Secondary | ICD-10-CM

## 2015-11-15 NOTE — Nursing Note (Signed)
Patient here for Immunotherapy.  Patient and antigen were identified with 2 identifiers, including patient's confirmation of correct antigen bottle(s).  Per Immunotherapy Protocol patient's condition was then assessed, which included any immediate or delayed reaction to previous dose, current health status, Peak flow reading if indicated, use of any new meds such as antibiotic or Beta Blocker, if antihistamine had been taken within the previous 24 hours, and length of time since last allergy injection(s).      The allergy injection(s) were then administered per the Immunotherapy Protcol build up schedule.  Patient's dose was (reduced, repeated, advanced) repeated.  Patient received in  Right arm: Aqueous Trees/Grass/Weeds 1:1 (Antigen Name,Concentration & Dose)  0.50  mL subq. After 30 minutes observation, Patient's injection sites were checked for any local reaction, and patient then left the clinic feeling fine.  Local reaction (wheal/erythema)  No reaction.      Injection tolerated well patient denies itching or other symptoms.   Tianna Baus, RN

## 2015-11-22 ENCOUNTER — Other Ambulatory Visit: Payer: Self-pay

## 2015-11-26 ENCOUNTER — Other Ambulatory Visit: Payer: Self-pay | Admitting: Family Medicine

## 2015-11-26 MED ORDER — ATORVASTATIN 20 MG TABLET
20.0000 mg | ORAL_TABLET | Freq: Every day | ORAL | 6 refills | Status: DC
  Filled 2015-11-26: qty 30, 30d supply, fill #0
  Filled 2015-12-30: qty 30, 30d supply, fill #1
  Filled 2016-01-27: qty 30, 30d supply, fill #2
  Filled 2016-03-11: qty 30, 30d supply, fill #3
  Filled 2016-04-19: qty 30, 30d supply, fill #4
  Filled 2016-05-21: qty 30, 30d supply, fill #5
  Filled 2016-06-29: qty 30, 30d supply, fill #6

## 2015-11-27 ENCOUNTER — Other Ambulatory Visit: Payer: Self-pay

## 2015-12-04 ENCOUNTER — Ambulatory Visit: Payer: Commercial Managed Care - HMO

## 2015-12-04 ENCOUNTER — Other Ambulatory Visit: Payer: Self-pay

## 2015-12-04 DIAGNOSIS — Z23 Encounter for immunization: Secondary | ICD-10-CM

## 2015-12-04 DIAGNOSIS — J301 Allergic rhinitis due to pollen: Secondary | ICD-10-CM

## 2015-12-04 NOTE — Nursing Note (Signed)
Patient here for Immunotherapy.  Patient and antigen were identified with 2 identifiers, including patient's confirmation of correct antigen bottle(s). Per Allergy protocol the patient's condition was then assessed, which included any immediate or delayed reaction to previous dose, current health status, Peak flow reading if indicated, use of any new meds such as antibiotic or Beta Blocker, if antihistamine had been taken within the previous 24 hours, and length of time since last allergy injection(s).    The allergy injection(s) were then administered per the build up schedule.  Patients dose was repeated.(reduced, repeated, advanced).   Patient received 0.50 mL of 1:1 Aq Tree/Grass/Weed subcutaneous in left arm.  Suzan Slick, RN    After 30 minutes observation, Patient's injection sites were checked for any local reaction. Patient then left the clinic feeling fine.  Local reaction was 0 (wheal/erythema).  Suzan Slick, RN    The Influenza Vaccine VIS document for the flu injection was given to patient to review. Patient or person named in permission has answered no to any history of egg allergy, previous serious reaction to a influenza vaccine or current illness which would preclude them receiving an immunization. Any questions were referred to the physician. The Influenza Vaccine was then administered per protocol. The patient was observed for immediate reactions to the vaccine per protocol. None were observed.   Suzan Slick, RN

## 2015-12-06 ENCOUNTER — Ambulatory Visit: Payer: Commercial Managed Care - HMO | Attending: Dermatology

## 2015-12-06 DIAGNOSIS — R7301 Impaired fasting glucose: Secondary | ICD-10-CM | POA: Insufficient documentation

## 2015-12-06 DIAGNOSIS — E7800 Pure hypercholesterolemia, unspecified: Secondary | ICD-10-CM | POA: Insufficient documentation

## 2015-12-06 DIAGNOSIS — B351 Tinea unguium: Principal | ICD-10-CM | POA: Insufficient documentation

## 2015-12-06 LAB — BASIC METABOLIC PANEL
CALCIUM: 9 mg/dL (ref 8.6–10.5)
CARBON DIOXIDE TOTAL: 28 mmol/L (ref 24–32)
CHLORIDE: 101 mmol/L (ref 95–110)
CREATININE BLOOD: 0.62 mg/dL (ref 0.44–1.27)
Glucose: 100 mg/dL — ABNORMAL HIGH (ref 70–99)
Potassium: 4.3 mmol/L (ref 3.3–5.0)
Sodium: 137 mmol/L (ref 135–145)
Urea Nitrogen, Blood (BUN): 18 mg/dL (ref 8–22)

## 2015-12-06 LAB — CBC WITH MANUAL DIFFERENTIAL
BASOPHILS %: 1 %
BASOPHILS ABS: 0.1 10*3/uL (ref 0.0–0.5)
EOSINOPHILS %: 1.9 %
EOSINOPHILS ABS: 0.1 10*3/uL (ref 0.1–0.3)
HEMATOCRIT: 40.1 % (ref 36.0–46.0)
Hemoglobin: 13.4 g/dL (ref 12.0–16.0)
LYMPHOCYTES ABS: 1.8 10*3/uL (ref 1.0–4.8)
LYMPHS %: 32.7 %
MCH: 32.3 pg (ref 27.0–33.0)
MCHC: 33.5 % (ref 32.0–36.0)
MCV: 96.6 UM3 (ref 80.0–100.0)
MONOCYTES %: 5.8 %
MONOCYTES ABS: 0.3 10*3/uL (ref 0.2–0.4)
MPV: 9 UM3 (ref 6.8–10.0)
NEUTROPHIL ABS: 3.3 10*3/uL (ref 1.8–7.7)
PLATELET COUNT: 185 10*3/uL (ref 130–400)
PLATELET ESTIMATE, SMEAR: ADEQUATE
POLYS (SEGS)%: 58.7 %
RBC-COLOR, SIZE,SHAPE: NORMAL
RDW: 12.3 U (ref 0.0–14.7)
RED CELL COUNT: 4.15 10*6/uL (ref 4.00–5.20)
WHITE BLOOD CELL COUNT: 5.6 10*3/uL (ref 4.5–11.0)

## 2015-12-06 LAB — HEPATIC FUNCTION PANEL
ALANINE TRANSFERASE (ALT): 32 U/L (ref 5–54)
ALBUMIN: 4.6 g/dL (ref 3.2–4.6)
ALKALINE PHOSPHATASE (ALP): 82 U/L (ref 35–115)
ASPARTATE TRANSAMINASE (AST): 26 U/L (ref 15–43)
BILIRUBIN TOTAL: 0.4 mg/dL (ref 0.3–1.3)
PROTEIN: 7.2 g/dL (ref 6.3–8.3)

## 2015-12-06 LAB — LIPID PANEL
CHOLESTEROL: 187 mg/dL (ref 0–200)
HDL CHOLESTEROL: 51 mg/dL (ref >=35)
LDL CHOLESTEROL CALCULATION: 107 mg/dL (ref <130)
NON-HDL CHOLESTEROL: 136 mg/dL (ref <=150)
TOTAL CHOLESTEROL:HDL RATIO: 3.7 (ref <4.0)
TRIGLYCERIDE: 145 mg/dL (ref 35–160)

## 2015-12-06 LAB — HEMOGLOBIN A1C
HGB A1C,GLUCOSE EST AVG: 114 mg/dL
HGB A1C: 5.6 % (ref 3.9–5.6)

## 2015-12-14 ENCOUNTER — Other Ambulatory Visit: Payer: Self-pay

## 2015-12-27 ENCOUNTER — Ambulatory Visit: Payer: Commercial Managed Care - HMO

## 2015-12-27 DIAGNOSIS — J301 Allergic rhinitis due to pollen: Secondary | ICD-10-CM

## 2015-12-27 NOTE — Nursing Note (Signed)
Patient here for Immunotherapy.  Patient and antigen were identified with 2 identifiers, including patient's confirmation of correct antigen bottle(s). Per Allergy protocol the patient's condition was then assessed, which included any immediate or delayed reaction to previous dose, current health status, Peak flow reading if indicated, use of any new meds such as antibiotic or Beta Blocker, if antihistamine had been taken within the previous 24 hours, and length of time since last allergy injection(s).    The allergy injection(s) were then administered per the build up schedule.  Patients dose was repeated.(reduced, repeated, advanced).   Patient received in right arm:  0.50 mL of 1:1 Aq Tree/Grass/Weed subcutaneous.  Suzan Slick, RN    After 30 minutes observation, Patient's injection sites were checked for any local reaction. Patient then left the clinic feeling fine.  Local reaction was 0/10 (wheal/erythema).  Suzan Slick, RN

## 2015-12-31 ENCOUNTER — Other Ambulatory Visit: Payer: Self-pay

## 2016-01-21 ENCOUNTER — Other Ambulatory Visit: Payer: Self-pay

## 2016-01-21 ENCOUNTER — Encounter: Payer: Self-pay | Admitting: Family Medicine

## 2016-01-21 NOTE — Telephone Encounter (Signed)
From: [name]  To: [name], MD  Sent: 01/18/2016 8:16 AM PST  Subject: MyChart Refill Request    Would like to request refill of Diazepam

## 2016-01-23 ENCOUNTER — Other Ambulatory Visit: Payer: Self-pay

## 2016-01-23 MED ORDER — DIAZEPAM 5 MG TABLET
5.0000 mg | ORAL_TABLET | Freq: Four times a day (QID) | ORAL | 0 refills | Status: DC | PRN
  Filled 2016-01-23: qty 30, 8d supply, fill #0

## 2016-01-24 ENCOUNTER — Ambulatory Visit: Payer: Commercial Managed Care - HMO

## 2016-01-24 DIAGNOSIS — J301 Allergic rhinitis due to pollen: Secondary | ICD-10-CM

## 2016-01-24 NOTE — Nursing Note (Addendum)
Patient here for Immunotherapy.  Patient and antigen were identified with 2 identifiers, including patient's confirmation of correct antigen bottle(s).  Per Immunotherapy Protocol patient's condition was then assessed, which included any immediate or delayed reaction to previous dose, current health status, Peak flow reading if indicated, use of any new meds such as antibiotic or Beta Blocker, if antihistamine had been taken within the previous 24 hours, and length of time since last allergy injection(s).      The allergy injection(s) were then administered per the Immunotherapy Protcol build up schedule.  Patient's dose was (reduced, repeated, advanced) repeated.  Patient received in  Left arm: Aqueous Trees/Grass/Weeds 1:1 (Antigen Name,Concentration & Dose)  0.50  mL subq.       After 30 minutes observation, Patient's injection sites were checked for any local reaction. Patient then left the clinic feeling fine.  Local reaction was 3/30 (wheal/erythema).  Suzan Slick, RN

## 2016-01-28 ENCOUNTER — Other Ambulatory Visit: Payer: Self-pay

## 2016-02-14 ENCOUNTER — Ambulatory Visit: Payer: Commercial Managed Care - HMO

## 2016-02-14 DIAGNOSIS — J301 Allergic rhinitis due to pollen: Secondary | ICD-10-CM

## 2016-02-14 NOTE — Nursing Note (Signed)
Patient here for Immunotherapy.  Patient and antigen were identified with 2 identifiers, including patient's confirmation of correct antigen bottle(s). Per Allergy protocol the patient's condition was then assessed, which included any immediate or delayed reaction to previous dose, current health status, Peak flow reading if indicated, use of any new meds such as antibiotic or Beta Blocker, if antihistamine had been taken within the previous 24 hours, and length of time since last allergy injection(s).    The allergy injection(s) were then administered per the build up schedule.  Patients dose was repeated.(reduced, repeated, advanced).   Patient received in right arm:  0.50 mL of 1:1 Aq Tree/Grass/Weed subcutaneous.  Dempsy Damiano C Sadhana Frater, RN    After 30 minutes observation, Patient's injection sites were checked for any local reaction. Patient then left the clinic feeling fine.  Local reaction was 0 (wheal/erythema).  Aaria Happ C Faye Strohman, RN

## 2016-02-22 ENCOUNTER — Other Ambulatory Visit: Payer: Self-pay

## 2016-02-26 ENCOUNTER — Other Ambulatory Visit: Payer: Self-pay

## 2016-02-27 ENCOUNTER — Other Ambulatory Visit: Payer: Self-pay

## 2016-03-11 ENCOUNTER — Other Ambulatory Visit: Payer: Self-pay

## 2016-03-12 ENCOUNTER — Ambulatory Visit (INDEPENDENT_AMBULATORY_CARE_PROVIDER_SITE_OTHER): Payer: Commercial Managed Care - HMO

## 2016-03-12 DIAGNOSIS — J301 Allergic rhinitis due to pollen: Secondary | ICD-10-CM

## 2016-03-17 ENCOUNTER — Encounter: Payer: Self-pay | Admitting: FAMILY PRACTICE

## 2016-03-17 ENCOUNTER — Other Ambulatory Visit: Payer: Self-pay

## 2016-03-17 ENCOUNTER — Ambulatory Visit: Payer: Commercial Managed Care - HMO | Admitting: FAMILY PRACTICE

## 2016-03-17 VITALS — BP 151/88 | HR 87 | Temp 98.0°F | Resp 18 | Wt 141.5 lb

## 2016-03-17 DIAGNOSIS — J209 Acute bronchitis, unspecified: Principal | ICD-10-CM

## 2016-03-17 MED ORDER — ALBUTEROL SULFATE HFA 90 MCG/ACTUATION AEROSOL INHALER
1.0000 | INHALATION_SPRAY | RESPIRATORY_TRACT | 2 refills | Status: DC | PRN
Start: 2016-03-17 — End: 2018-12-28
  Filled 2016-03-17: qty 8.5, 30d supply, fill #0

## 2016-03-17 MED ORDER — AZITHROMYCIN 250 MG TABLET
ORAL_TABLET | ORAL | 0 refills | Status: AC
Start: 2016-03-17 — End: 2016-03-22
  Filled 2016-03-17: qty 6, 5d supply, fill #0

## 2016-03-17 MED ORDER — INHALATIONAL SPACING DEVICE
1.0000 | Freq: Four times a day (QID) | 0 refills | Status: AC | PRN
Start: 2016-03-17 — End: 2017-03-12
  Filled 2016-03-17: qty 1, 30d supply, fill #0

## 2016-03-17 NOTE — Nursing Note (Signed)
Vitals taken, allergies verified, screened for pain. Hedaya Latendresse, MA1

## 2016-03-17 NOTE — Progress Notes (Signed)
SUBJECTIVE:  Marisa Jenkins is a 61yr old female who is here for the following: had concerns including Upper Respiratory Infection.    More than 2 weeks, scratchy throat then congestion, continuing, was clear and now green, and now also with cough, can't lay down at night. Really bad headache this morning. No fever.   She does get allergy shots, and she sometimes gets these symptoms when she goes too long without an allergy shot, though feels it is worse now than is typical.     Takes zyrtec  No nasal sprays, used the neti pot with no benefit.     Inhaler prescribed last year when she had similar symptoms.     Review of Systems:  As per HPI.     OBJECTIVE:  BP 151/88  Pulse 87  Temp 36.7 C (98 F) (Temporal)  Resp 18  Wt 64.2 kg (141 lb 8.6 oz)  LMP 10/28/2010  SpO2 98%  BMI 23.73 kg/m2  GEN-alert, oriented, well nourished and comfortable with normal affect  SKIN-no rashes or suspicous lesions visible on exposed skin   EYES-PERRLA, EOMI, clear sclera   EARS-normal canals, clear TM's no discharge  NOSE- boggy swollen mucosa  MOUTH/THROAT- trace erythema, trace exudate, no lesions  NECK/THYROID- no adenopathy, no thyromegaly  HEART- normal s1 s2, normal rate, no murmurs or extra sounds  LUNGS-diffuse bilateral rhonchi.       ASSESSMENT/PLAN:  1. Bronchitis with bronchospasm  With associated rhinosinusitis.  Differential diagnosis discussed. Symptomatic treatment with over-ther-counter chest decongestants (if not hypertensive), antipyretics, fluids and rest. Discussed contact precautions. Discussed prescription antibiotics risks and benefits. Needs prescription medication to manage symptoms. Precautions reviewed.   - Azithromycin (ZITHROMAX) 250 mg Tablet; Take 2 tablets orally today, then 1 tablet daily for four more days.  Dispense: 6 tablet; Refill: 0  - Albuterol (PROAIR HFA, PROVENTIL HFA, VENTOLIN HFA) 90 mcg/actuation inhaler; Take 1 to 2 puffs by inhalation every 4 hours if needed for wheezing.  Dispense:  8.5 g; Refill: 2  - Inhalational Spacing Device (AEROCHAMBER MAX WITH FLOW-VU) Spacer; Use three to four times daily or as directed.  Dispense: 1 each; Refill: 0      Discussed the medical findings and treatment recommendations with the patient.     I reviewed the patient's active problem list in the EMR and have reviewed and confirmed current medications in medical record. I have reviewed and updated past medical and social history.     Barriers to learning: none. Patient verbalizes understanding of teaching and instructions. Patient reminded to always contact me should any concerns develop.    This report electronically signed by:   Thomasene Ripple, MD  Associate Physician  Family Medicine and Psychiatry   [place]  Department of [facility] Medicine            Note: This chart was created in part with [employer] or with [employer] Every effort has been made to correct any errors in the voice-recognition / dictation, but some may have been missed. If there are any questions, please contact the author for clarification and revision if needed.

## 2016-03-18 ENCOUNTER — Other Ambulatory Visit: Payer: Self-pay

## 2016-03-25 ENCOUNTER — Telehealth: Payer: Self-pay | Admitting: Rheumatology

## 2016-03-25 ENCOUNTER — Other Ambulatory Visit: Payer: Self-pay | Admitting: Rheumatology

## 2016-03-25 DIAGNOSIS — J301 Allergic rhinitis due to pollen: Principal | ICD-10-CM

## 2016-03-25 NOTE — Telephone Encounter (Signed)
Patient currently receives IT, last seen by Dr. Karis Juba 04/2015.     [place],     Can you check in green chart when antigen expires, my guess is in Feb and let me know? Please advise patient her referral expired and needs to be renewed. Please schedule annual follow up with Dr. Karis Juba, I will renew her IT referral and wait for antigen as last visit had discussed increasing potency.    Asa Saunas, RN  [facility], Rheumatology & Immunology  [phone]

## 2016-03-25 NOTE — Progress Notes (Signed)
Renewal for ongoing IT.

## 2016-03-25 NOTE — Telephone Encounter (Signed)
Patient calling to schedule an allergy shot.  No current order for allergy shots is available please advise patient if she needs to be seen by Dr. Karis Juba first or if an order can be placed.    Thank you,     Cindy Hazy

## 2016-03-26 NOTE — Telephone Encounter (Signed)
Antigen expires 2/28//18, her IT has been approved effective 03/25/16.

## 2016-03-27 ENCOUNTER — Ambulatory Visit: Payer: Commercial Managed Care - HMO

## 2016-03-27 DIAGNOSIS — J301 Allergic rhinitis due to pollen: Secondary | ICD-10-CM

## 2016-03-27 NOTE — Nursing Note (Addendum)
Patient here for Immunotherapy.  Patient and antigen were identified with 2 identifiers, including patient's confirmation of correct antigen bottle(s). Per Allergy protocol the patient's condition was then assessed, which included any immediate or delayed reaction to previous dose, current health status, Peak flow reading if indicated, use of any new meds such as antibiotic or Beta Blocker, if antihistamine had been taken within the previous 24 hours, and length of time since last allergy injection(s).    The allergy injection(s) were then administered per the build up schedule.  Patients dose was decreased as it has been 6 weeks since her last injection.(reduced, repeated, advanced).   Patient received in left arm:  0.30 mL of 1:1 Aq Tree/Grass/Weed subcutaneous.  Suzan Slick, RN    After 30 minutes observation, Patient's injection sites were checked for any local reaction. Patient then left the clinic feeling fine.  Local reaction was 0 (wheal/erythema).  Suzan Slick, RN

## 2016-03-27 NOTE — Telephone Encounter (Addendum)
Attempt made to reach patient at [phone]. Per automated voice message, voicemail box is full.    Staff -- Should patient return call, please inform patient that we will have her see Dr. Karis Juba first for annual follow-up and that she will also see Almyra Free for her allergy shots this afternoon at 4pm. Thank you.    [place], [place]

## 2016-03-28 ENCOUNTER — Encounter: Payer: Self-pay | Admitting: Rheumatology

## 2016-03-28 NOTE — Telephone Encounter (Signed)
This was discussed at her appointment yesterday   Suzan Slick, RN

## 2016-03-28 NOTE — Telephone Encounter (Addendum)
Patients referral to Allergy had expired, it has been renewed and approved. It looks like she had her injection 1/18.  Asa Saunas, RN  [facility], Rheumatology & Immunology  [phone]

## 2016-03-31 NOTE — Telephone Encounter (Signed)
Patient has had her IT for this month.

## 2016-04-01 ENCOUNTER — Other Ambulatory Visit: Payer: Self-pay

## 2016-04-07 ENCOUNTER — Other Ambulatory Visit: Payer: Self-pay | Admitting: Family Medicine

## 2016-04-07 DIAGNOSIS — Z1231 Encounter for screening mammogram for malignant neoplasm of breast: Principal | ICD-10-CM

## 2016-04-10 ENCOUNTER — Other Ambulatory Visit: Payer: Self-pay

## 2016-04-10 ENCOUNTER — Ambulatory Visit: Payer: Commercial Managed Care - HMO

## 2016-04-10 ENCOUNTER — Ambulatory Visit: Payer: Commercial Managed Care - HMO | Admitting: Rheumatology

## 2016-04-10 VITALS — BP 136/80 | HR 96 | Temp 98.4°F | Ht 65.0 in | Wt 138.0 lb

## 2016-04-10 DIAGNOSIS — J301 Allergic rhinitis due to pollen: Secondary | ICD-10-CM

## 2016-04-10 NOTE — Nursing Note (Signed)
Vital signs taken, allergies verified, medications reviewed, pain screened and documented.  Erline Siddoway, RN

## 2016-04-10 NOTE — Nursing Note (Signed)
Patient here for Immunotherapy.  Patient and antigen were identified with 2 identifiers, including patient's confirmation of correct antigen bottle(s).  Per Immunotherapy Protocol patient's condition was then assessed, which included any immediate or delayed reaction to previous dose, current health status, Peak flow reading if indicated, use of any new meds such as antibiotic or Beta Blocker, if antihistamine had been taken within the previous 24 hours, and length of time since last allergy injection(s).      The allergy injection(s) were then administered per the Immunotherapy Protcol build up schedule.  Patient's dose was (reduced, repeated, advanced) advanced.  Patient received in  Right arm: Aqueous Trees/Grass/Weeds 1:1 (Antigen Name,Concentration & Dose)  0.30  mL subq. After 30 minutes observation, Patient's injection sites were checked for any local reaction, and patient then left the clinic feeling fine.  Local reaction (wheal/erythema)  2/3.      Injection tolerated well patient denies itching or other symptoms.   Orpah Melter, RN  ]

## 2016-04-10 NOTE — Progress Notes (Signed)
The patient is seen in followup for her allergic rhinitis.  The patient has done well since increasing the potency of the immunotherapy at her last visit.  She does have very mild symptoms before her next injection during the pollen season.  She has tolerated immunotherapy with minimal reactions.  She takes cetirizine, but has not used any INCS.  She has not had any asthma.    Examination: Per month of; notable for a small polyp on the right nares but clear chest.    Assessment/plan: Allergic rhinitis.  The patient wishes to continue her present immunotherapy into year 4.  She will return in one year.  She may use cetirizine and or fluticasone nasal spray for breakthrough symptoms.

## 2016-04-16 ENCOUNTER — Ambulatory Visit: Payer: Commercial Managed Care - HMO

## 2016-04-16 DIAGNOSIS — J301 Allergic rhinitis due to pollen: Secondary | ICD-10-CM

## 2016-04-16 NOTE — Nursing Note (Signed)
10 doses Aq Tree/Grass/Weed remixed.  Suzan Slick, RN

## 2016-04-21 ENCOUNTER — Other Ambulatory Visit: Payer: Self-pay

## 2016-05-01 ENCOUNTER — Other Ambulatory Visit: Payer: Self-pay

## 2016-05-01 ENCOUNTER — Ambulatory Visit: Payer: Commercial Managed Care - HMO | Admitting: Rheumatology

## 2016-05-01 ENCOUNTER — Encounter: Payer: Self-pay | Admitting: Family Medicine

## 2016-05-01 ENCOUNTER — Ambulatory Visit: Payer: Commercial Managed Care - HMO

## 2016-05-01 DIAGNOSIS — J301 Allergic rhinitis due to pollen: Secondary | ICD-10-CM

## 2016-05-01 MED ORDER — FLUTICASONE PROPIONATE 50 MCG/ACTUATION NASAL SPRAY,SUSPENSION
1.0000 | Freq: Every day | NASAL | 0 refills | Status: DC
Start: 2016-05-01 — End: 2016-09-23
  Filled 2016-05-01: qty 16, 30d supply, fill #0

## 2016-05-01 NOTE — Telephone Encounter (Signed)
From: [name]  To: [name], MD  Sent: 05/01/2016 3:40 PM PST  Subject: Preventive Care    My chart says I need a pneumonic vaccination. Is this correct? If so, can you order it for me? Almyra Free said she can give it to me next time I get an allergy shot

## 2016-05-01 NOTE — Nursing Note (Signed)
Patient here for Immunotherapy.  Patient and antigen were identified with 2 identifiers, including patient's confirmation of correct antigen bottle(s). Per Allergy protocol the patient's condition was then assessed, which included any immediate or delayed reaction to previous dose, current health status, Peak flow reading if indicated, use of any new meds such as antibiotic or Beta Blocker, if antihistamine had been taken within the previous 24 hours, and length of time since last allergy injection(s).    The allergy injection(s) were then administered per the build up schedule.  Patients dose was decreased due to remix.(reduced, repeated, advanced).   Patient received in left arm:  0.20 mL of 1:1 Aq Tree/Grass/Weed subcutaneous.  Suzan Slick, RN    After 30 minutes observation, Patient's injection sites were checked for any local reaction. Patient then left the clinic feeling fine.  Local reaction was 0 (wheal/erythema).  Suzan Slick, RN    Spirometry performed, as it ordered at her yearly allergy follow up , but not done that day.  Suzan Slick, RN

## 2016-05-05 ENCOUNTER — Other Ambulatory Visit: Payer: Self-pay

## 2016-05-06 ENCOUNTER — Ambulatory Visit
Admission: RE | Admit: 2016-05-06 | Discharge: 2016-05-11 | Disposition: A | Discharge status: Home / Self Care | Payer: Commercial Managed Care - HMO | Source: Ambulatory Visit | Attending: Diagnostic Radiology | Admitting: Diagnostic Radiology

## 2016-05-06 DIAGNOSIS — Z1231 Encounter for screening mammogram for malignant neoplasm of breast: Secondary | ICD-10-CM | POA: Insufficient documentation

## 2016-05-07 ENCOUNTER — Ambulatory Visit: Payer: Commercial Managed Care - HMO

## 2016-05-07 DIAGNOSIS — J301 Allergic rhinitis due to pollen: Secondary | ICD-10-CM

## 2016-05-07 NOTE — Nursing Note (Addendum)
Patient here for Immunotherapy.  Patient and antigen were identified with 2 identifiers, including patient's confirmation of correct antigen bottle(s). Per Allergy protocol the patient's condition was then assessed, which included any immediate or delayed reaction to previous dose, current health status, Peak flow reading if indicated, use of any new meds such as antibiotic or Beta Blocker, if antihistamine had been taken within the previous 24 hours, and length of time since last allergy injection(s).    The allergy injection(s) were then administered per the build up schedule.  Patients dose was advanced.(reduced, repeated, advanced).   Patient received in right arm:  0.30 mL of 1:1 Aq Tree/Grass/Weed subcutaneous.  Zacherie Honeyman C Maneh Sieben, RN    After 30 minutes observation, Patient's injection sites were checked for any local reaction. Patient then left the clinic feeling fine.  Local reaction was 0 (wheal/erythema).  Ilma Achee C Shealyn Sean, RN

## 2016-05-09 NOTE — Telephone Encounter (Signed)
Jenny Reichmann can you please assist with patient's questions.    Thank you,  Tiana Loft, [place] I

## 2016-05-14 ENCOUNTER — Ambulatory Visit: Payer: Commercial Managed Care - HMO

## 2016-05-14 DIAGNOSIS — J301 Allergic rhinitis due to pollen: Secondary | ICD-10-CM

## 2016-05-14 NOTE — Nursing Note (Signed)
Patient here for Immunotherapy.  Patient and antigen were identified with 2 identifiers, including patient's confirmation of correct antigen bottle(s). Per Allergy protocol the patient's condition was then assessed, which included any immediate or delayed reaction to previous dose, current health status, Peak flow reading if indicated, use of any new meds such as antibiotic or Beta Blocker, if antihistamine had been taken within the previous 24 hours, and length of time since last allergy injection(s).    The allergy injection(s) were then administered per the build up schedule.  Patients dose was advanced.(reduced, repeated, advanced).   Patient received in left arm:  0.40 mL of 1:1 Aq Tree/Grass/Weed subcutaneous.  Suzan Slick, RN    After 30 minutes observation, Patient's injection sites were checked for any local reaction. Patient then left the clinic feeling fine.  Local reaction was 5/32 (wheal/erythema).  Suzan Slick, RN

## 2016-05-19 ENCOUNTER — Other Ambulatory Visit: Payer: Self-pay | Admitting: Family Medicine

## 2016-05-19 ENCOUNTER — Other Ambulatory Visit: Payer: Self-pay

## 2016-05-20 ENCOUNTER — Other Ambulatory Visit: Payer: Self-pay

## 2016-05-21 ENCOUNTER — Ambulatory Visit: Payer: Commercial Managed Care - HMO | Admitting: Family Medicine

## 2016-05-21 ENCOUNTER — Encounter: Payer: Self-pay | Admitting: Family Medicine

## 2016-05-21 ENCOUNTER — Other Ambulatory Visit: Payer: Self-pay

## 2016-05-21 ENCOUNTER — Ambulatory Visit: Payer: Commercial Managed Care - HMO | Attending: Family Medicine

## 2016-05-21 VITALS — BP 134/84 | HR 87 | Temp 97.3°F | Wt 146.6 lb

## 2016-05-21 DIAGNOSIS — N3000 Acute cystitis without hematuria: Principal | ICD-10-CM

## 2016-05-21 DIAGNOSIS — J301 Allergic rhinitis due to pollen: Secondary | ICD-10-CM | POA: Insufficient documentation

## 2016-05-21 LAB — URINALYSIS AND CULTURE IF IND
GLUCOSE URINE: NEGATIVE mg/dL
KETONES: NEGATIVE mg/dL
NITRITE URINE: POSITIVE — AB
PH URINE: 6.5 (ref 4.8–7.8)
RBC: 31 /HPF — AB (ref 0–5)
SPECIFIC GRAVITY, URINE: 1.016 (ref 1.002–1.030)
SQUAMOUS EPI: 3 /HPF (ref 0–5)
UROBILINOGEN.: 6 mg/dL — AB (ref ?–2.0)
WBC: 199 /HPF — AB (ref 0–5)

## 2016-05-21 LAB — POC URINALYSIS-DIPSTICK
POC BILIRUBIN URINE: NEGATIVE
POC NITRITE URINE: POSITIVE
POC PH URINALYSIS: 6
POC SP GRAVITY: 1.015
POC UROBILINOGEN: 2

## 2016-05-21 MED ORDER — BUTALBITAL-ASPIRIN-CAFFEINE 50 MG-325 MG-40 MG CAPSULE
1.0000 | ORAL_CAPSULE | Freq: Four times a day (QID) | ORAL | 0 refills | Status: DC | PRN
  Filled 2016-05-21: qty 30, 8d supply, fill #0

## 2016-05-21 MED ORDER — CEPHALEXIN 500 MG CAPSULE
ORAL_CAPSULE | ORAL | 0 refills | Status: DC
Start: 2016-05-21 — End: 2016-06-03
  Filled 2016-05-21: qty 15, 5d supply, fill #0

## 2016-05-21 NOTE — Nursing Note (Signed)
Patient here for Immunotherapy.  Patient and antigen were identified with 2 identifiers, including patient's confirmation of correct antigen bottle(s). Per Allergy protocol the patient's condition was then assessed, which included any immediate or delayed reaction to previous dose, current health status, Peak flow reading if indicated, use of any new meds such as antibiotic or Beta Blocker, if antihistamine had been taken within the previous 24 hours, and length of time since last allergy injection(s).    The allergy injection(s) were then administered per the build up schedule.  Patients dose was advanced.(reduced, repeated, advanced).   Patient received in right arm:  0.50 mL of 1:1 Aq Tree/Grass/Weed subcutaneous.  Suzan Slick, RN    After 30 minutes observation, Patient's injection sites were checked for any local reaction. Patient then left the clinic feeling fine.  Local reaction was 5/27 (wheal/erythema).  Suzan Slick, RN

## 2016-05-21 NOTE — Telephone Encounter (Signed)
Called patient and schedule, she understood,    Will pick up pain contract today, video order and mychart instructions mailed to patient.    Thank you,  Tiana Loft, [place] I

## 2016-05-21 NOTE — Telephone Encounter (Signed)
Please assist with scheduling a follow up appointment for chronic pain medication. Dajahnae Vondra, MD

## 2016-05-21 NOTE — Nursing Note (Signed)
Vitals have been recorded. Allergies, Tobacco History, Pain and Pharmacy have been reviewed. Thank you. Jazz MA I

## 2016-05-21 NOTE — Progress Notes (Signed)
Chief complaint dysuria    Patient presents with dysuria urgency frequency over the past few days. Mutually monogamous, no history of frequent UTIs. No fever back pain or nausea. No history of allergy to cephalosporins.   Chief Complaint   Patient presents with    Urinary Tract Infection      family history includes Cancer in her maternal grandmother; Genetic in her father; Heart in her brother and father; Lipids in her father; motor vehicle accident in her mother; osteoporosis in her paternal grandmother; paroxysmal atrial tachycardia in her son.  Social History   Substance Use Topics    Smoking status: Former Smoker    Smokeless tobacco: Never Used      Comment: quit at age 59    Alcohol use 0.0 oz/week     0 Standard drinks or equivalent per week      Comment: 1 glass per day, vodka tonic     Past Surgical History:   Procedure Laterality Date    BIOPSY, BREAST  2008    benign    BIOPSY, BREAST Left 10/2009    benign    COLONOSCOPY  11/24/2014    large pedunculated polyp    COLONOSCOPY  11/24/2014    COLONOSCOPY  06/22/2015    serrated tubular adenoma; no recurrence of rectal cancerhyperplastic polyp, repeat in 3 years    ESOPHAGOGASTRODUODENOSCOPY (EGD)  11/24/2014    small antral ulcerations    FLEXIBLE SIGMOIDOSCOPY  03/16/2015    HC UPPER GI ENDOSCOPY,BIOPSY  11/24/2014    PR COLSC FLX W/RMVL OF TUMOR POLYP LESION SNARE TQ  04/22/2011    tubular adenoma: recheck 05/2014    PR COLSC FLX W/RMVL OF TUMOR POLYP LESION SNARE TQ  05/18/06    tubular adenoma    PR LAP PROCEDURE, UNLISTED, BLADDER  2003    bladder repair    PR REMOVAL OF LEG VEINS/ULCER  02/2003    vein stripping  Dr. Vira Agar    PR SIGMOIDOSCOPY,BIOPSY  03/16/2015    TONSILLECTOMY      TONSILLECTOMY AND ADENOIDECTOMY  1962     Family History   Problem Relation Age of Onset    Lipids Father      deceased age 45 CHF    Heart Father      coronary artery disease    Genetic Father      rheumatoid arthritis    Heart Brother      coronary  artery disease 7s    Cancer Maternal Grandmother      breast, 25    osteoporosis [OTHER] Paternal Grandmother     motor vehicle accident [OTHER] Mother      died at 62    paroxysmal atrial tachycardia [OTHER] Son      Patient Active Problem List   Diagnosis    Hyperlipidemia    Postmenopausal HRT (hormone replacement therapy)    Migraine headache    Allergic rhinitis    Anxiety disorder    Impaired fasting glucose    Pain in joint of right hand    Internal hemorrhoids    Allergic rhinitis due to pollen    Cerumen impaction    Injury of right foot    Libido, decreased    Pain, abdominal    Tubular adenoma    Polyp of colon    Elevated liver enzymes    Rectal cancer    Multifocal PVCs with pairing           Current Outpatient Prescriptions:  Albuterol (PROAIR HFA, PROVENTIL HFA, VENTOLIN HFA) 90 mcg/actuation inhaler, Take 1 to 2 puffs by inhalation every 4 hours if needed for wheezing., Disp: 8.5 g, Rfl: 2    Aspirin 325mg /Caffeine 40mg /Butalbital 50mg  (FIORINAL) 50-325-40 mg per capsule, Take 1 capsule by mouth 4 times daily if needed. Not to exceed 4 capsules in 24 hours.  Avoid taking within 8 hours of taking valium, Disp: 30 capsule, Rfl: 0    Aspirin 81 mg DR Tablet EC Tablet, Take 1 tablet by mouth every day., Disp: 30 tablet, Rfl: 5    Atorvastatin (LIPITOR) 20 mg Tablet, Take 1 tablet by mouth every day., Disp: 30 tablet, Rfl: 6    Cephalexin (KEFLEX) 500 mg Capsule, Take 1 capsule by mouth 3 times per day for 3 to 5 days, Disp: 15 capsule, Rfl: 0    Cetirizine (ZYRTEC) 10 mg Tablet, Take 1 tablet by mouth once daily if needed for Allergies., Disp: 30 tablet, Rfl: 5    Estradiol Biweekly 0.0375 mg/24 hr (VIVELLE-DOT) Patch, Apply 1 patch to the skin two times a week., Disp: 24 patch, Rfl: 11    FamoTIDine (PEPCID) 40 mg Tablet, Take 1 tablet by mouth every day at bedtime., Disp: 30 tablet, Rfl: 11    Fluticasone (FLONASE) 50 mcg/actuation nasal spray, Instill 1 spray into  EACH nostril every day. Rinse and gargle after using, Disp: 16 g, Rfl: 0    Inhalational Spacing Device (AEROCHAMBER MAX WITH FLOW-VU) Spacer, Use three to four times daily or as directed., Disp: 1 each, Rfl: 0    Progesterone Micronized (PROMETRIUM) 200 mg capsule, Take 1 capsule by mouth every day., Disp: 30 capsule, Rfl: 11    Promethazine-DM (PROMETHAZINE-DM) 6.25-15 mg/5 mL syrup, Take 5 mL by mouth every 6 hours if needed for cough., Disp: 118 mL, Rfl: 0     R.O.S. See above   BP 134/84  Pulse 87  Temp 36.3 C (97.3 F) (Temporal)  Wt 66.5 kg (146 lb 9.7 oz)  LMP 10/28/2010  BMI 24.4 kg/m2     OBJECTIVE: General Appearance: Alert, oriented, cooperative, no apparent distress.  No CVA tenderness    ASSESSMENT:    (N30.00) Acute cystitis without hematuria  (primary encounter diagnosis)  Comment: treat with keflex 500 mg three times per  day x 3-5days  Plan: Call if symptoms worsen, or fail to resolve as anticipated.       I reviewed patient's past medical and social history.  Barriers to Learning assessed: none. Patient verbalizes understanding of teaching and instructions.  Roselle Norton Eliezer Bottom, MD

## 2016-05-22 ENCOUNTER — Ambulatory Visit: Payer: Commercial Managed Care - HMO

## 2016-05-24 LAB — CULTURE URINE, BACTI

## 2016-05-26 LAB — EMMI VIDEO - OPIOID EDUCATION: EMMI ACCESS CODE: 15261222747

## 2016-06-03 ENCOUNTER — Encounter: Payer: Self-pay | Admitting: Family Medicine

## 2016-06-03 ENCOUNTER — Ambulatory Visit: Payer: Commercial Managed Care - HMO | Admitting: Family Medicine

## 2016-06-03 ENCOUNTER — Other Ambulatory Visit: Payer: Self-pay

## 2016-06-03 VITALS — BP 127/87 | HR 89 | Temp 98.9°F | Resp 16 | Wt 146.2 lb

## 2016-06-03 DIAGNOSIS — R7301 Impaired fasting glucose: Secondary | ICD-10-CM

## 2016-06-03 DIAGNOSIS — Z299 Encounter for prophylactic measures, unspecified: Secondary | ICD-10-CM

## 2016-06-03 DIAGNOSIS — Z23 Encounter for immunization: Secondary | ICD-10-CM

## 2016-06-03 DIAGNOSIS — G43009 Migraine without aura, not intractable, without status migrainosus: Principal | ICD-10-CM

## 2016-06-03 MED ORDER — PROGESTERONE MICRONIZED 100 MG CAPSULE
100.0000 mg | ORAL_CAPSULE | Freq: Every day | ORAL | 11 refills | Status: DC
Start: 2016-06-03 — End: 2017-06-07
  Filled 2016-06-03: qty 30, 30d supply, fill #0
  Filled 2016-07-11: qty 30, 30d supply, fill #1
  Filled 2016-08-12: qty 30, 30d supply, fill #2
  Filled 2016-09-02: qty 30, 30d supply, fill #3
  Filled 2016-10-14: qty 30, 30d supply, fill #4
  Filled 2016-11-15: qty 30, 30d supply, fill #5
  Filled 2016-12-26: qty 30, 30d supply, fill #6
  Filled 2017-02-02: qty 30, 30d supply, fill #7
  Filled 2017-03-07: qty 30, 30d supply, fill #8
  Filled 2017-04-07: qty 30, 30d supply, fill #9
  Filled 2017-05-12: qty 30, 30d supply, fill #10

## 2016-06-03 MED ORDER — DIAZEPAM 5 MG TABLET
5.0000 mg | ORAL_TABLET | Freq: Four times a day (QID) | ORAL | 0 refills | Status: DC | PRN
  Filled 2016-06-03: qty 3, 1d supply, fill #0
  Filled 2016-06-03: qty 27, 7d supply, fill #0

## 2016-06-03 MED ORDER — ESTRADIOL 0.025 MG/24 HR SEMIWEEKLY TRANSDERMAL PATCH
1.0000 | MEDICATED_PATCH | TRANSDERMAL | 11 refills | Status: DC
  Filled 2016-06-03: qty 8, 28d supply, fill #0
  Filled 2016-07-16: qty 8, 28d supply, fill #1
  Filled 2016-09-02: qty 8, 28d supply, fill #2
  Filled 2016-10-22: qty 8, 28d supply, fill #3
  Filled 2016-11-28: qty 8, 28d supply, fill #4
  Filled 2017-01-06: qty 8, 28d supply, fill #5

## 2016-06-03 NOTE — Nursing Note (Signed)
Vital signs taken,tobacco,allergies,pharmacy verified, screened for pain.  Marisa Meiklejohn, MA

## 2016-06-04 NOTE — Progress Notes (Signed)
06/03/16  EPIC FAMILY MEDICINE VISIT: MRN# [id number]  Name: Marisa Jenkins  Date of birth: 19-Sep-1955  Primary care physician is [name], MD     Chief Complaint   Patient presents with    Pain         SUBJECTIVE:  Marisa Jenkins is a 61yr old female who is here for follow-up appointment for ongoing problems of migraine headaches, which she takes Midrin, which usually works pretty well, and occasionally takes Valium for panic attacks. She takes it if she goes flying, and if she gets anxious at bedtime. She takes about 30 tablets every 3 months. Denies any recent changes with her medications other than going down on her estrogen patch. That seems to be going well and she denies any abnormal bleeding and has not had any hot flashes or other problems. Overall she feels that her treatment is going well, and headaches have been less since she went down on the hormone replacement therapy. She has filled out the pain contract, although is not taking any narcotic type medicines. She does take benzodiazepine. Occasionally takes Tylenol or ibuprofen as well. She would also like to follow-up on elevated fasting sugars, since she has been eating better, and avoiding sweets. She is also due for shingles vaccine, and had a regular Zostavax vaccine, but would like to get this shingle ricks vaccine this year. Tetanus vaccine is up-to-date.          Patient Active Problem List   Diagnosis    Hyperlipidemia    Postmenopausal HRT (hormone replacement therapy)    Migraine headache    Allergic rhinitis    Anxiety disorder    Impaired fasting glucose    Pain in joint of right hand    Internal hemorrhoids    Allergic rhinitis due to pollen    Cerumen impaction    Injury of right foot    Libido, decreased    Pain, abdominal    Tubular adenoma    Polyp of colon    Elevated liver enzymes    Rectal cancer    Multifocal PVCs with pairing       REVIEW of SYSTEMS :  Review of Systems   Constitutional: Negative.    HENT:  Positive for congestion. Negative for rhinorrhea and sinus pain.    Eyes: Negative.    Respiratory: Negative.    Cardiovascular: Negative.    Gastrointestinal: Negative.    Endocrine: Negative.    Genitourinary: Negative.    Musculoskeletal: Negative.    Allergic/Immunologic: Positive for environmental allergies.   Neurological: Positive for headaches. Negative for light-headedness.   Psychiatric/Behavioral: Positive for sleep disturbance. Negative for agitation. The patient is nervous/anxious.         OBJECTIVE:  Vital signs:    BP 127/87  Pulse 89  Temp 37.2 C (98.9 F) (Temporal)  Resp 16  Wt 66.3 kg (146 lb 2.6 oz)  LMP 10/28/2010  BMI 24.32 kg/m2   General Appearance: healthy, alert, no distress, pleasant affect, cooperative.  Heart:  normal rate and regular rhythm, no murmurs, clicks, or gallops.  Lungs: clear to auscultation.  Neuro: Gait normal. Reflexes normal and symmetric. Sensation and strength grossly normal.  Mental Status: euthymic,  Happy to learn about narcotics but actually not taking any.         ASSESSMENT:  (G43.009) Migraine without aura and without status migrainosus, not intractable  (primary encounter diagnosis)  Comment: reviewed overall treatment.  Does take midrin, which has  potential addiction  Plan: Cyanocobalamin (VITAMIN B-12) 1,000 mcg         Sublingual Tablet            (Z29.9) Preventive measure  Comment:   Plan: COMPREHENSIVE METABOLIC PANEL            (Z23) Need for shingles vaccine  Comment:   Plan: ZOSTER VACCINE (RECOMBINANT)            (R73.01) Impaired fasting glucose  Comment:   Plan: HEMOGLOBIN A1C, COMPREHENSIVE METABOLIC PANEL        Low sugar diet recommended     Return to clinic: 4 months or as needed   Advised to call back if symptoms fail to improve as expected or worsen.     Total visit time: 25 minutes, of which  Over 50 % was spent face-to-face counseling about the following diagnoses/ health problems:    1. Migraine without aura and without status  migrainosus, not intractable    2. Preventive measure    3. Need for shingles vaccine    4. Impaired fasting glucose       Barriers to Learning assessed: none. Patient verbalizes understanding of teaching and instructions and agrees with the plan of care.    I reviewed the patient's past medical and family/social history, and updated problem list for elevated sugar..  Barriers to Learning assessed: none. Patient verbalizes understanding of teaching and instructions.  [name], MD  Family Practice, [facility]  [facility]     This document has been created using Dragon Naturally Speaking and has been checked for content. Some voice recognition errors may have been missed inadvertantly.

## 2016-06-09 ENCOUNTER — Ambulatory Visit (INDEPENDENT_AMBULATORY_CARE_PROVIDER_SITE_OTHER): Payer: Commercial Managed Care - HMO

## 2016-06-09 DIAGNOSIS — J301 Allergic rhinitis due to pollen: Secondary | ICD-10-CM

## 2016-06-12 ENCOUNTER — Ambulatory Visit: Payer: Commercial Managed Care - HMO

## 2016-06-12 DIAGNOSIS — J301 Allergic rhinitis due to pollen: Secondary | ICD-10-CM

## 2016-06-12 NOTE — Nursing Note (Signed)
Patient here for Immunotherapy.  Patient and antigen were identified with 2 identifiers, including patient's confirmation of correct antigen bottle(s).  Per Immunotherapy Protocol patient's condition was then assessed, which included any immediate or delayed reaction to previous dose, current health status, Peak flow reading if indicated, use of any new meds such as antibiotic or Beta Blocker, if antihistamine had been taken within the previous 24 hours, and length of time since last allergy injection(s).      The allergy injection(s) were then administered per the Immunotherapy Protcol build up schedule.  Patient's dose was (reduced, repeated, advanced) repeated.  Patient received in  Left arm: Aqueous Trees/Grass/Weeds 1:1 (Antigen Name,Concentration & Dose)  0.50  mL subq. After 30 minutes observation, Patient's injection sites were checked for any local reaction, and patient then left the clinic feeling fine.  Local reaction (wheal/erythema) no reaction.      Injection tolerated well patient denies itching or other symptoms.   Orpah Melter, RN

## 2016-06-26 ENCOUNTER — Ambulatory Visit: Payer: Commercial Managed Care - HMO | Admitting: Family Medicine

## 2016-06-26 ENCOUNTER — Ambulatory Visit: Payer: Commercial Managed Care - HMO | Attending: Family Medicine

## 2016-06-26 ENCOUNTER — Ambulatory Visit (INDEPENDENT_AMBULATORY_CARE_PROVIDER_SITE_OTHER): Payer: Commercial Managed Care - HMO

## 2016-06-26 ENCOUNTER — Encounter: Payer: Self-pay | Admitting: Family Medicine

## 2016-06-26 VITALS — BP 130/83 | HR 76 | Temp 97.0°F | Resp 16 | Wt 144.4 lb

## 2016-06-26 DIAGNOSIS — H1013 Acute atopic conjunctivitis, bilateral: Principal | ICD-10-CM

## 2016-06-26 DIAGNOSIS — Z23 Encounter for immunization: Secondary | ICD-10-CM

## 2016-06-26 DIAGNOSIS — Z299 Encounter for prophylactic measures, unspecified: Principal | ICD-10-CM | POA: Insufficient documentation

## 2016-06-26 DIAGNOSIS — R7301 Impaired fasting glucose: Secondary | ICD-10-CM | POA: Insufficient documentation

## 2016-06-26 DIAGNOSIS — J301 Allergic rhinitis due to pollen: Secondary | ICD-10-CM | POA: Insufficient documentation

## 2016-06-26 LAB — COMPREHENSIVE METABOLIC PANEL
ALBUMIN: 4.5 g/dL (ref 3.2–4.6)
ASPARTATE TRANSAMINASE (AST): 45 U/L — AB (ref 15–43)
Alanine Transferase (ALT): 62 U/L — ABNORMAL HIGH (ref 5–54)
Alkaline Phosphatase (ALP): 76 U/L (ref 35–115)
BILIRUBIN TOTAL: 0.5 mg/dL (ref 0.3–1.3)
CALCIUM: 9.6 mg/dL (ref 8.6–10.5)
CARBON DIOXIDE TOTAL: 28 mmol/L (ref 24–32)
CHLORIDE: 101 mmol/L (ref 95–110)
CREATININE BLOOD: 0.63 mg/dL (ref 0.44–1.27)
GLUCOSE: 116 mg/dL — AB (ref 70–99)
POTASSIUM: 4.4 mmol/L (ref 3.3–5.0)
PROTEIN: 7.6 g/dL (ref 6.3–8.3)
SODIUM: 138 mmol/L (ref 135–145)
UREA NITROGEN, BLOOD (BUN): 19 mg/dL (ref 8–22)

## 2016-06-26 LAB — HEMOGLOBIN A1C
HGB A1C,GLUCOSE EST AVG: 114 mg/dL
HGB A1C: 5.6 % (ref 3.9–5.6)

## 2016-06-26 NOTE — Nursing Note (Signed)
Immunization VIS documentation(s) were given to patient to review. All questions were answered and the patient consented to the Immunization(s) being given. Patient allergies were reviewed and no contraindications were found. The immunization(s) were given as ordered. The patient was observed for any immediate reactions to the vaccine. None were observed.  Shingrix administered.  Delila Kuklinski C Emin Foree, RN

## 2016-06-26 NOTE — Nursing Note (Signed)
Vital signs taken,tobacco,allergies,pharmacy verified, screened for pain.  Lynde Ludwig, MA

## 2016-06-26 NOTE — Patient Instructions (Signed)
Allergen avoidance (shutting windows in mornings and evenings and avoiding prolonged outdoor activities) is important for pollen/ spring time allergies. Over the counter antihistamines including cetirizine, fexofenadine, and loratidine once daily are also helpful. Benadryl can be taken at bedtime. Nasal cromolyn is another option.   Nasal steroid sprays are also helpful if you have nasal congestion, especially with ear pressure or sinus pain.     Antihistamines:Take antihistamines continuously during pollen season. Continuous treatment/prevention is the key to control. 24 hr antihistamines include Zyrtec, Allegra and Claritin. Benadryl can be used but does cause drowsiness.     Wash Off Pollen: Remove pollen from the nose with saline nasal washes Remove pollen from the hair and skin with hair-washing and a shower, especially before bedtime.    Eye Allergies: Wash the pollen off the face and eyelids then apply cold, wet washcloth. Can flush eyes with saline eye wash or administer Zaditor.    Nasal congestion: Use saline nasal spray to clean nasal passages and wash out pollen. Use nettie pot with filtered water and salt according to directions.     Clean Air: Keep windows in house closed, especially in the early evening. Change air filter to air conditioner monthly. Keep windows in car rolled up and use air conditioner with recycled air while driving.  Put bedding including comforter into the dry on high heat setting for 10 to 15 minutes once a week.    Allergy desensitization shots are reserved for severe and refractory cases since they are expensive and time consuming.

## 2016-06-26 NOTE — Nursing Note (Addendum)
Patient here for Immunotherapy.  Patient and antigen were identified with 2 identifiers, including patient's confirmation of correct antigen bottle(s). Per Allergy protocol the patient's condition was then assessed, which included any immediate or delayed reaction to previous dose, current health status, Peak flow reading if indicated, use of any new meds such as antibiotic or Beta Blocker, if antihistamine had been taken within the previous 24 hours, and length of time since last allergy injection(s).    The allergy injection(s) were then administered per the build up schedule.  Patients dose was repeated.(reduced, repeated, advanced).   Patient received in right arm:  0.50 mL of 1:1 Aq Tree/Grass/Weed subcutaneous.  Jameisha Stofko C Shalon Councilman, RN    After 30 minutes observation, Patient's injection sites were checked for any local reaction. Patient then left the clinic feeling fine.  Local reaction was 3/3 (wheal/erythema).  Khandi Kernes C [name], RN

## 2016-06-27 DIAGNOSIS — H1013 Acute atopic conjunctivitis, bilateral: Secondary | ICD-10-CM

## 2016-06-27 HISTORY — DX: Acute atopic conjunctivitis, bilateral: H10.13

## 2016-06-27 NOTE — Progress Notes (Signed)
06/26/2016  EPIC FAMILY MEDICINE VISIT: MRN# [id number]  Name: Marisa Jenkins  Date of birth: Oct 18, 1955  Primary care physician is [name], MD     Chief Complaint   Patient presents with    Eye Problem     itching and redness, mucous upon awakening         SUBJECTIVE:  Marisa Jenkins is a 61yr old female who is here for a new problem of bilateral I mucus with crusting, upon awakening this morning. She denies any change with vision, or eye pain although she does have itching and some redness. There does not seem to be persistent mucus production. She has not had any sinus pain, or runny nose. She did rinse her eyes, which helped this morning. She has had allergy symptoms with nasal congestion, sneezing.  No discolored mucous or fever.           Patient Active Problem List   Diagnosis    Hyperlipidemia    Postmenopausal HRT (hormone replacement therapy)    Migraine headache    Allergic rhinitis    Anxiety disorder    Impaired fasting glucose    Pain in joint of right hand    Internal hemorrhoids    Allergic rhinitis due to pollen    Cerumen impaction    Injury of right foot    Libido, decreased    Pain, abdominal    Tubular adenoma    Polyp of colon    Elevated liver enzymes    Rectal cancer    Multifocal PVCs with pairing       REVIEW of SYSTEMS :  Review of Systems   Constitutional: Negative.    HENT: Positive for congestion, postnasal drip, rhinorrhea and sneezing. Negative for sinus pain and sinus pressure.    Eyes: Positive for discharge, redness and itching. Negative for photophobia, pain and visual disturbance.   Respiratory: Negative.    Cardiovascular: Negative.    Gastrointestinal: Negative.    Musculoskeletal: Negative.    Neurological: Negative for headaches.   Psychiatric/Behavioral: Negative.         OBJECTIVE:  Vital signs:    BP 130/83  Pulse 76  Temp 36.1 C (97 F) (Temporal)  Resp 16  Wt 65.5 kg (144 lb 6.4 oz)  LMP 10/28/2010  BMI 24.03 kg/m2   General Appearance:  healthy, alert, no distress, pleasant affect, cooperative.  Eyes:  conjunctivae/corneas - conjunctival injection OU and clear watery drainage, minimal crusting on eyelids, pupils equally round and reactive to light, extraocular movements intact   Ears:  normal TMs and canal and external inspection of ears show no abnormality.  Nose:  mucosa swollen, pale, and boggy, clear rhinorrhea.  Mouth: mild erythema and cobble stoning.  Neck:  Neck supple. No adenopathy, thyroid symmetric, normal size.  Heart:  normal rate and regular rhythm, no murmurs, clicks, or gallops.  Lungs: clear to auscultation.        ASSESSMENT:  (H10.13) Allergic conjunctivitis of both eyes  (primary encounter diagnosis)  Comment: advised unlikely to be bacterial since bilaterally affected and only clear drainage  Plan: trial of zaditor allergy eye drops and warm compresses,  Call back if symptoms worsen or fail to improve within a few days.     Return to clinic: as needed   Advised to call back if symptoms fail to improve as expected or worsen.     Total visit time: 15 minutes, of which  50 % was spent face-to-face counseling  about the following diagnoses/ health problems:    1. Allergic conjunctivitis of both eyes       Barriers to Learning assessed: none. Patient verbalizes understanding of teaching and instructions and agrees with the plan of care.    I reviewed the patient's past medical and family/social history, and updated problem list .  Barriers to Learning assessed: none. Patient verbalizes understanding of teaching and instructions.  [name], MD  Family Practice, [facility]  [facility]     This document has been created using Dragon Naturally Speaking and has been checked for content. Some voice recognition errors may have been missed inadvertantly.  Electronically signed by:  [name], MD  Family Practice Department/ [facility]

## 2016-06-30 ENCOUNTER — Other Ambulatory Visit: Payer: Self-pay

## 2016-07-02 ENCOUNTER — Other Ambulatory Visit: Payer: Self-pay | Admitting: Family Medicine

## 2016-07-02 DIAGNOSIS — R748 Abnormal levels of other serum enzymes: Principal | ICD-10-CM

## 2016-07-10 ENCOUNTER — Ambulatory Visit: Payer: Commercial Managed Care - HMO

## 2016-07-10 DIAGNOSIS — J301 Allergic rhinitis due to pollen: Secondary | ICD-10-CM

## 2016-07-10 NOTE — Nursing Note (Signed)
Patient here for Immunotherapy.  Patient and antigen were identified with 2 identifiers, including patient's confirmation of correct antigen bottle(s). Per Allergy protocol the patient's condition was then assessed, which included any immediate or delayed reaction to previous dose, current health status, Peak flow reading if indicated, use of any new meds such as antibiotic or Beta Blocker, if antihistamine had been taken within the previous 24 hours, and length of time since last allergy injection(s).    The allergy injection(s) were then administered per the build up schedule.  Patients dose was repeated.(reduced, repeated, advanced).   Patient received in left arm:  0.50 mL of 1:1 Aq Tree/Grass/Weed subcutaneous.  Amiir Heckard C Emrys Mceachron, RN    After 30 minutes observation, Patient's injection sites were checked for any local reaction. Patient then left the clinic feeling fine.  Local reaction was 0 (wheal/erythema).  Theodore Virgin C Shakur Lembo, RN

## 2016-07-11 ENCOUNTER — Other Ambulatory Visit: Payer: Self-pay

## 2016-07-16 ENCOUNTER — Other Ambulatory Visit: Payer: Self-pay

## 2016-07-23 ENCOUNTER — Ambulatory Visit: Payer: Commercial Managed Care - HMO

## 2016-07-23 DIAGNOSIS — J301 Allergic rhinitis due to pollen: Secondary | ICD-10-CM

## 2016-07-23 NOTE — Nursing Note (Addendum)
Patient here for Immunotherapy.  Patient and antigen were identified with 2 identifiers, including patient's confirmation of correct antigen bottle(s). Per Allergy protocol the patient's condition was then assessed, which included any immediate or delayed reaction to previous dose, current health status, Peak flow reading if indicated, use of any new meds such as antibiotic or Beta Blocker, if antihistamine had been taken within the previous 24 hours, and length of time since last allergy injection(s).    The allergy injection(s) were then administered per the build up schedule.  Patients dose was repeated.(reduced, repeated, advanced).   Patient received in right arm:  0.50 mL of 1:1 Aq Tree/Grass/Weed subcutaneous.  Caige Almeda C Kjirsten Bloodgood, RN    After 30 minutes observation, Patient's injection sites were checked for any local reaction. Patient then left the clinic feeling fine.  Local reaction was 0 (wheal/erythema).  Lizzy Hamre C Verlia Kaney, RN

## 2016-08-07 ENCOUNTER — Other Ambulatory Visit: Payer: Self-pay | Admitting: Family Medicine

## 2016-08-08 ENCOUNTER — Other Ambulatory Visit: Payer: Self-pay

## 2016-08-08 MED ORDER — FAMOTIDINE 40 MG TABLET
40.0000 mg | ORAL_TABLET | Freq: Every day | ORAL | 11 refills | Status: DC
Start: 2016-08-08 — End: 2017-01-15
  Filled 2016-08-08: qty 30, 30d supply, fill #0
  Filled 2016-09-02: qty 30, 30d supply, fill #1
  Filled 2016-10-09: qty 30, 30d supply, fill #2
  Filled 2016-11-15: qty 30, 30d supply, fill #3
  Filled 2016-12-25: qty 30, 30d supply, fill #4

## 2016-08-12 ENCOUNTER — Other Ambulatory Visit: Payer: Self-pay

## 2016-08-12 ENCOUNTER — Ambulatory Visit: Payer: Commercial Managed Care - HMO

## 2016-08-12 DIAGNOSIS — J301 Allergic rhinitis due to pollen: Secondary | ICD-10-CM

## 2016-08-12 NOTE — Nursing Note (Signed)
Patient here for Immunotherapy.  Patient and antigen were identified with 2 identifiers, including patient's confirmation of correct antigen bottle(s). Per Allergy protocol the patient's condition was then assessed, which included any immediate or delayed reaction to previous dose, current health status, Peak flow reading if indicated, use of any new meds such as antibiotic or Beta Blocker, if antihistamine had been taken within the previous 24 hours, and length of time since last allergy injection(s).    The allergy injection(s) were then administered per the build up schedule.  Patients dose was repeated.(reduced, repeated, advanced).   Patient received in left arm:  0.50 mL of 1:1 Aq Tree/Grass/Weed subcutaneous.  Calub Tarnow C Nicholos Aloisi, RN    After 30 minutes observation, Patient's injection sites were checked for any local reaction. Patient then left the clinic feeling fine.  Local reaction was 0 (wheal/erythema).  Tanay Massiah C Uchechukwu Dhawan, RN

## 2016-08-26 ENCOUNTER — Ambulatory Visit: Payer: Commercial Managed Care - HMO

## 2016-08-26 DIAGNOSIS — J301 Allergic rhinitis due to pollen: Secondary | ICD-10-CM

## 2016-08-26 NOTE — Nursing Note (Signed)
Patient here for Immunotherapy.  Patient and antigen were identified with 2 identifiers, including patient's confirmation of correct antigen bottle(s). Per Allergy protocol the patient's condition was then assessed, which included any immediate or delayed reaction to previous dose, current health status, Peak flow reading if indicated, use of any new meds such as antibiotic or Beta Blocker, if antihistamine had been taken within the previous 24 hours, and length of time since last allergy injection(s).    The allergy injection(s) were then administered per the build up schedule.  Patients dose was repeated.(reduced, repeated, advanced).   Patient received in right arm:  0.50 mL of 1:1 Aq Tree/Grass/Weed subcutaneous.  Demontray Franta C Reniyah Gootee, RN    After 30 minutes observation, Patient's injection sites were checked for any local reaction. Patient then left the clinic feeling fine.  Local reaction was 0 (wheal/erythema).  Kaileen Bronkema C Hartlee Amedee, RN

## 2016-09-02 ENCOUNTER — Other Ambulatory Visit: Payer: Self-pay

## 2016-09-03 ENCOUNTER — Other Ambulatory Visit: Payer: Self-pay

## 2016-09-09 ENCOUNTER — Ambulatory Visit: Payer: Commercial Managed Care - HMO

## 2016-09-09 DIAGNOSIS — J301 Allergic rhinitis due to pollen: Secondary | ICD-10-CM

## 2016-09-09 NOTE — Nursing Note (Signed)
Patient here for Immunotherapy.  Patient and antigen were identified with 2 identifiers, including patient's confirmation of correct antigen bottle(s). Per Allergy protocol the patient's condition was then assessed, which included any immediate or delayed reaction to previous dose, current health status, Peak flow reading if indicated, use of any new meds such as antibiotic or Beta Blocker, if antihistamine had been taken within the previous 24 hours, and length of time since last allergy injection(s).    The allergy injection(s) were then administered per the build up schedule.  Patients dose was repeated.(reduced, repeated, advanced).   Patient received in left arm:  0.50 mL of 1:1 Aq Tree/Grass/Weed subcutaneous.  Marleny Faller C Thecla Forgione, RN    After 30 minutes observation, Patient's injection sites were checked for any local reaction. Patient then left the clinic feeling fine.  Local reaction was 0 (wheal/erythema).  Kyesha Balla C Kostas Marrow, RN

## 2016-09-15 ENCOUNTER — Telehealth: Payer: Self-pay | Admitting: Family Medicine

## 2016-09-15 NOTE — Telephone Encounter (Signed)
Called patient to offer follow up , no answer left VM to call back, update [place] and schedule follow up if needed.    Records requested and placed in MD's box for review.    Thank you,  Tiana Loft, [place] I

## 2016-09-15 NOTE — Telephone Encounter (Signed)
TELEPHONE ENCOUNTER REPORT  Client Group: [facility] [place]  Call Completed Time: Sep 14, 2016 16:38  PDT  Patient Name: Marisa Jenkins [facility] Call ID: [id number]  DOB: [birth year] Caller: Darcus Pester  Age: 61 years Self  Gender: Female Phone: [phone]  Which Physician do you see at [facility]? [name] - [facility]  Medical Record Number: Will have upon call back, please verify  Disposition: See Physician within 24 Hours  Presenting Complaint: ear pain  Assessment: Are there any allergies? If yes, please list.  unknown  Are there any chronic medical conditions? If yes, please list.  unknown  Has there been a recent hospitalization? If yes, please list.  unknown  Protocol: Adult -Earache  Assessment: 1. LOCATION: "Which ear is involved?"  ear pain in L ear that started within last 3 hours 6/10 on pain scale in ear; pt also has sore  throat; swallows saliva and is drinking fluids; no difficulty breathing; speaking in complete  sentences; no fever;  Positive Response to  Protocol:  Earache (Exceptions: brief ear pain of < 60 minutes duration, earache occurring during air  travel  Care Advice: CALL BACK IF: * Severe pain persists over 2 hours after pain medicine * You become worse.  Selected Goals:  Select Call Type ([place]): Triage  Do you understand the information I have provided? Would you like to  repeat it back to me to be sure or do you have any other questions?  Patient/Caller verbalized understanding  Patient will comply with Recommendations? Yes  Closing Statement Verbalized to Patient: "If your symptoms change or  worsen, please call back or seek medical attention sooner".  Yes  RN/HSS: Did you use an Interpreter? No  Was authorization to a UCC given? Yes  Which Location: [facility] on [address]  Name of Person Spoke with: Alyssa  Notes: pt called while driving to [facility]  Sep 14, 2016 16:38 PDT  Attempts:  Call Id: [id number]

## 2016-09-16 ENCOUNTER — Other Ambulatory Visit: Payer: Self-pay | Admitting: Rheumatology

## 2016-09-16 DIAGNOSIS — J301 Allergic rhinitis due to pollen: Principal | ICD-10-CM

## 2016-09-16 NOTE — Progress Notes (Signed)
Pt's antigen expires in August.  Needs remix.  Demarrius Guerrero C Minta Fair, RN

## 2016-09-18 NOTE — Telephone Encounter (Signed)
Reviewed records from [facility] urgent care.  She was seen for sore throat for 5 days and then left ear pain.  There was swelling and erythema in the left ear canal and tympanic membrane was not visualized.  Throat was erythematous.  She was treated with a Z-Pak and ofloxacin otic solution.  She should be seen for a follow-up ear check if the ear pain has not fully subsided by the end of the treatment.  [name], MD

## 2016-09-19 NOTE — Telephone Encounter (Signed)
Called patient and notified patient , she is doing well . No action needed at this time.     Thank you,  Tiana Loft, [place] I

## 2016-09-23 ENCOUNTER — Ambulatory Visit: Payer: Commercial Managed Care - HMO | Admitting: Family Medicine

## 2016-09-23 ENCOUNTER — Encounter: Payer: Self-pay | Admitting: Family Medicine

## 2016-09-23 VITALS — BP 135/82 | HR 79 | Temp 97.4°F | Resp 16 | Wt 153.0 lb

## 2016-09-23 DIAGNOSIS — H6982 Other specified disorders of Eustachian tube, left ear: Secondary | ICD-10-CM | POA: Insufficient documentation

## 2016-09-23 DIAGNOSIS — H6992 Unspecified Eustachian tube disorder, left ear: Secondary | ICD-10-CM

## 2016-09-23 HISTORY — DX: Unspecified Eustachian tube disorder, left ear: H69.92

## 2016-09-23 HISTORY — DX: Other specified disorders of eustachian tube, left ear: H69.82

## 2016-09-23 NOTE — Nursing Note (Signed)
Vital signs taken,tobacco,allergies,pharmacy verified, screened for pain.  Mckenzy Salazar, MA

## 2016-09-23 NOTE — Patient Instructions (Signed)
Middle Ear Fluid: Care Instructions  Your Care Instructions     Fluid often builds up inside the ear during a cold or allergies. Usually the fluid drains away, but sometimes a small tube in the ear, called the eustachian tube, stays blocked for months.  Symptoms of fluid buildup may include:   Popping, ringing, or a feeling of fullness or pressure in the ear.   Trouble hearing.   Balance problems and dizziness.  In most cases, you can treat yourself at home.  Follow-up care is a key part of your treatment and safety. Be sure to make and go to all appointments, and call your doctor if you are having problems. It's also a good idea to know your test results and keep a list of the medicines you take.  How can you care for yourself at home?   In most cases, the fluid clears up within a few months without treatment. You may need more tests if the fluid does not clear up after 3 months.   If your doctor prescribed antibiotics, take them as directed. Do not stop taking them just because you feel better. You need to take the full course of antibiotics.  When should you call for help?  Watch closely for changes in your health, and be sure to contact your doctor if:   You have pain or a fever.   You have any new symptoms, such as hearing problems.   You do not get better as expected.   Where can you learn more?   Go to https://www.healthwise.net/patiented  Enter S930 in the search box to learn more about "Middle Ear Fluid: Care Instructions."    2006-2015 Healthwise, Incorporated. Care instructions adapted under license by [facility]. This care instruction is for use with your licensed healthcare professional. If you have questions about a medical condition or this instruction, always ask your healthcare professional. Healthwise, Incorporated disclaims any warranty or liability for your use of this information.  Content Version: 10.6.465758; Current as of: January 21, 2013              Eustachian Tube  Problems: Care Instructions  Your Care Instructions     The eustachian (say "you-STAY-shee-un") tubes run between the inside of the ears and the throat. They keep air pressure stable in the ears. If your eustachian tubes become blocked, the air pressure in your ears changes. The fluids from a cold can clog eustachian tubes, causing pain in the ears. A quick change in air pressure can cause eustachian tubes to close up. This might happen when an airplane changes altitude or when a scuba diver goes up or down underwater.  Eustachian tube problems often clear up on their own or after antibiotic treatment. If your tubes continue to be blocked, you may need surgery.  Follow-up care is a key part of your treatment and safety. Be sure to make and go to all appointments, and call your doctor if you are having problems. It's also a good idea to know your test results and keep a list of the medicines you take.  How can you care for yourself at home?   To ease ear pain, apply a warm washcloth or a heating pad set on low. There may be some drainage from the ear when the heat melts earwax. Put a cloth between the heat source and your skin. Do not use a heating pad with children.   If your doctor prescribed antibiotics, take them as directed.   Do not stop taking them just because you feel better. You need to take the full course of antibiotics.   Your doctor may recommend over-the-counter medicine. Be save with medicines. Oral or nasal decongestants may relieve ear pain. Avoid decongestants that are combined with antihistamines, which tend to cause more blockage. But if allergies seem to be the problem, your doctor may recommend a combination. Before you use cough and cold medicines, check the label. These medicines may not be safe for young children or for people with certain health problems.  When should you call for help?  Call your doctor now or seek immediate medical care if:   You develop sudden, complete hearing  loss.   You have severe pain or feel dizzy.   You have new or increasing pus or blood draining from your ear.   You have redness, swelling, or pain around or behind the ear.  Watch closely for changes in your health, and be sure to contact your doctor if:   You do not get better after 2 weeks.   You have any new symptoms, such as itching or a feeling of fullness in the ear.   Where can you learn more?   Go to https://www.healthwise.net/patiented  Enter Y822 in the search box to learn more about "Eustachian Tube Problems: Care Instructions."    2006-2015 Healthwise, Incorporated. Care instructions adapted under license by [facility]. This care instruction is for use with your licensed healthcare professional. If you have questions about a medical condition or this instruction, always ask your healthcare professional. Healthwise, Incorporated disclaims any warranty or liability for your use of this information.  Content Version: 10.6.465758; Current as of: January 21, 2013

## 2016-09-23 NOTE — Progress Notes (Signed)
09/23/2016  EPIC FAMILY MEDICINE VISIT: MRN# [id number]  Name: Marisa Jenkins  Date of birth: 01-05-1956  Primary care physician is [name], MD     Chief Complaint   Patient presents with    Ear Problem     left ear, oe took zpack and ear drops         SUBJECTIVE:  Marisa Jenkins is a 61yr old female who is here for an ongoing problem of left ear pressure and mild discomfort.  She was having sinus congestion and pressure, for about 2 weeks when her left ear started to really hurt.  She went to the urgent care clinic, and was prescribed Zithromax and some eardrops which helped with the pain, and sinus pain and pressure went away.  She does have persistent left ear pressure, with no drainage, but feels like her ear is clogged.  She has tried clearing out any wax, and got minimal to no wax out.  Denies any ongoing pain or fevers, sinus pressure is fully resolved at this point.  She has also tried taking Sudafed sinus which has helped.  She is asking if she needs to take any more antibiotics.          Patient Active Problem List   Diagnosis    Hyperlipidemia    Postmenopausal HRT (hormone replacement therapy)    Migraine headache    Allergic rhinitis    Anxiety disorder    Impaired fasting glucose    Pain in joint of right hand    Internal hemorrhoids    Allergic rhinitis due to pollen    Cerumen impaction    Injury of right foot    Libido, decreased    Pain, abdominal    Tubular adenoma    Polyp of colon    Elevated liver enzymes    Rectal cancer    Multifocal PVCs with pairing    Allergic conjunctivitis of both eyes       REVIEW of SYSTEMS :  Constitutional: negative  Skin: negative  Eyes: negative  Ears: pressure sensation left ear only  Nose: congestion, resolved  Mouth/Throat: postnasal drip   Respiratory: negative  Gastointestinal: negative  Genitourinary: negative       OBJECTIVE:  Vital signs:    BP 135/82  Pulse 79  Temp 36.3 C (97.4 F) (Temporal)  Resp 16  Wt 69.4 kg (153 lb)   LMP 10/28/2010  BMI 25.46 kg/m2   General Appearance: healthy, alert, no distress, pleasant affect, cooperative.  Eyes:  conjunctivae and corneas clear. PERRL, EOM's intact. sclerae normal.  Ears:  TMs: air/fluid interface on left and external inspection of ears show no abnormality.  Nose:  Mild nasal congestion, slight clear rhinorrhea .  Mouth: normal.  Neck:  Neck supple. No adenopathy, thyroid symmetric, normal size.  Heart:  normal rate and regular rhythm, no murmurs, clicks, or gallops.  Lungs: clear to auscultation.        ASSESSMENT:  (P50.93) Dysfunction of left eustachian tube  (primary encounter diagnosis)  Comment: see pt instructions,   Plan: Fluticasone (FLONASE) 50 mcg/actuation nasal         spray        Advised to continue with sudafed sinus, nasal saline spray three times daily, flonase and call back if pain recurs or fail to improve within another week.     Return to clinic: as needed   Advised to call back if symptoms fail to improve as expected or worsen.  Total visit time: 15 minutes, of which  50 % was spent face-to-face counseling about the following diagnoses/ health problems:    1. Dysfunction of left eustachian tube       Barriers to Learning assessed: none. Patient verbalizes understanding of teaching and instructions and agrees with the plan of care.    I reviewed the patient's past medical and family/social history, and updated problem list .  Barriers to Learning assessed: none. Patient verbalizes understanding of teaching and instructions.  [name], MD  Family Practice, [facility]  [facility]     This document has been created using Dragon Naturally Speaking and has been checked for content. Some voice recognition errors may have been missed inadvertantly.  Electronically signed by:  [name], MD  Family Practice Department/ [facility]

## 2016-09-25 ENCOUNTER — Ambulatory Visit: Payer: Commercial Managed Care - HMO

## 2016-09-25 DIAGNOSIS — J301 Allergic rhinitis due to pollen: Secondary | ICD-10-CM

## 2016-09-25 NOTE — Nursing Note (Signed)
Patient here for Immunotherapy.  Patient and antigen were identified with 2 identifiers, including patient's confirmation of correct antigen bottle(s). Per Allergy protocol the patient's condition was then assessed, which included any immediate or delayed reaction to previous dose, current health status, Peak flow reading if indicated, use of any new meds such as antibiotic or Beta Blocker, if antihistamine had been taken within the previous 24 hours, and length of time since last allergy injection(s).    The allergy injection(s) were then administered per the build up schedule.  Patients dose was repeated.(reduced, repeated, advanced).   Patient received in right arm:  0.50 mL of 1:1 Aq Tree/Grass/Weed subcutaneous.  Shalla Bulluck C Hancel Ion, RN    After 30 minutes observation, Patient's injection sites were checked for any local reaction. Patient then left the clinic feeling fine.  Local reaction was 0 (wheal/erythema).  Cashmere Dingley C Aleli Navedo, RN

## 2016-10-08 ENCOUNTER — Ambulatory Visit: Payer: Commercial Managed Care - HMO

## 2016-10-08 DIAGNOSIS — J301 Allergic rhinitis due to pollen: Secondary | ICD-10-CM

## 2016-10-08 NOTE — Nursing Note (Signed)
Patient here for Immunotherapy.  Patient and antigen were identified with 2 identifiers, including patient's confirmation of correct antigen bottle(s). Per Allergy protocol the patient's condition was then assessed, which included any immediate or delayed reaction to previous dose, current health status, Peak flow reading if indicated, use of any new meds such as antibiotic or Beta Blocker, if antihistamine had been taken within the previous 24 hours, and length of time since last allergy injection(s).    The allergy injection(s) were then administered per the build up schedule.  Patients dose was repeated.(reduced, repeated, advanced).   Patient received ini left arm:  0.50 mL of 1:1 Aq Tree/Grass/Weed subcutaneous.  Suzan Slick, RN    After 30 minutes observation, Patient's injection sites were checked for any local reaction. Patient then left the clinic feeling fine.  Local reaction was 0 (wheal/erythema).  Suzan Slick, RN

## 2016-10-09 ENCOUNTER — Other Ambulatory Visit: Payer: Self-pay | Admitting: Family Medicine

## 2016-10-09 ENCOUNTER — Other Ambulatory Visit: Payer: Self-pay

## 2016-10-09 MED ORDER — DIAZEPAM 5 MG TABLET
5.0000 mg | ORAL_TABLET | Freq: Four times a day (QID) | ORAL | 0 refills | Status: DC | PRN
  Filled 2016-10-09: qty 30, 8d supply, fill #0

## 2016-10-10 ENCOUNTER — Other Ambulatory Visit: Payer: Self-pay

## 2016-10-14 ENCOUNTER — Other Ambulatory Visit: Payer: Self-pay

## 2016-10-22 ENCOUNTER — Other Ambulatory Visit: Payer: Self-pay

## 2016-10-22 ENCOUNTER — Ambulatory Visit: Payer: Commercial Managed Care - HMO

## 2016-10-22 DIAGNOSIS — J301 Allergic rhinitis due to pollen: Secondary | ICD-10-CM

## 2016-10-22 NOTE — Nursing Note (Signed)
20 doses Aq Tree/Grass/Weed received from [facility].  Eoin Willden C Rowyn Spilde, RN

## 2016-10-23 ENCOUNTER — Ambulatory Visit: Payer: Commercial Managed Care - HMO

## 2016-10-23 DIAGNOSIS — J301 Allergic rhinitis due to pollen: Secondary | ICD-10-CM

## 2016-10-23 NOTE — Nursing Note (Addendum)
Patient here for Immunotherapy.  Patient and antigen were identified with 2 identifiers, including patient's confirmation of correct antigen bottle(s). Per Allergy protocol the patient's condition was then assessed, which included any immediate or delayed reaction to previous dose, current health status, Peak flow reading if indicated, use of any new meds such as antibiotic or Beta Blocker, if antihistamine had been taken within the previous 24 hours, and length of time since last allergy injection(s).    The allergy injection(s) were then administered per the build up schedule.  Patients dose was decreased due to remix.(reduced, repeated, advanced).   Patient received in right arm:  0.30 mL of 1:1 Aq Tree/Grass/Weed subcutaneous.  Suzan Slick, RN    After 30 minutes observation, Patient's injection sites were checked for any local reaction. Patient then left the clinic feeling fine.  Local reaction was 0 (wheal/erythema).  Suzan Slick, RN

## 2016-11-02 ENCOUNTER — Other Ambulatory Visit: Payer: Self-pay | Admitting: Family Medicine

## 2016-11-05 ENCOUNTER — Other Ambulatory Visit: Payer: Self-pay

## 2016-11-05 MED ORDER — BUTALBITAL-ASPIRIN-CAFFEINE 50 MG-325 MG-40 MG CAPSULE
1.0000 | ORAL_CAPSULE | Freq: Four times a day (QID) | ORAL | 0 refills | Status: DC | PRN
  Filled 2016-11-05: qty 30, 8d supply, fill #0

## 2016-11-06 ENCOUNTER — Ambulatory Visit: Payer: Commercial Managed Care - HMO

## 2016-11-06 DIAGNOSIS — J301 Allergic rhinitis due to pollen: Secondary | ICD-10-CM

## 2016-11-06 NOTE — Nursing Note (Signed)
Patient here for Immunotherapy.  Patient and antigen were identified with 2 identifiers, including patient's confirmation of correct antigen bottle(s). Per Allergy protocol the patient's condition was then assessed, which included any immediate or delayed reaction to previous dose, current health status, Peak flow reading if indicated, use of any new meds such as antibiotic or Beta Blocker, if antihistamine had been taken within the previous 24 hours, and length of time since last allergy injection(s).    The allergy injection(s) were then administered per the build up schedule.  Patients dose was advanced.(reduced, repeated, advanced).   Patient received in left arm:  0.40 mL of 1:1 Aq Tree/Grass/Weed subcutaneous.  Aly Seidenberg C Anani Gu, RN    After 30 minutes observation, Patient's injection sites were checked for any local reaction. Patient then left the clinic feeling fine.  Local reaction was 0 (wheal/erythema).  Bethanee Redondo C Jeremiah Tarpley, RN

## 2016-11-17 ENCOUNTER — Other Ambulatory Visit: Payer: Self-pay

## 2016-11-18 ENCOUNTER — Ambulatory Visit: Payer: Commercial Managed Care - HMO

## 2016-11-18 DIAGNOSIS — J301 Allergic rhinitis due to pollen: Secondary | ICD-10-CM

## 2016-11-18 NOTE — Nursing Note (Signed)
Patient here for Immunotherapy.  Patient and antigen were identified with 2 identifiers, including patient's confirmation of correct antigen bottle(s). Per Allergy protocol the patient's condition was then assessed, which included any immediate or delayed reaction to previous dose, current health status, Peak flow reading if indicated, use of any new meds such as antibiotic or Beta Blocker, if antihistamine had been taken within the previous 24 hours, and length of time since last allergy injection(s).    The allergy injection(s) were then administered per the build up schedule.  Patients dose was advanced.(reduced, repeated, advanced).   Patient received in right arm:  0.50 mL of 1:1 Aq Tree/Grass/Weed subcutaneous.  Suzan Slick, RN    After 30 minutes observation, Patient's injection sites were checked for any local reaction. Patient then left the clinic feeling fine.  Local reaction was 5/18 (wheal/erythema).  Suzan Slick, RN

## 2016-11-28 ENCOUNTER — Other Ambulatory Visit: Payer: Self-pay | Admitting: Family Medicine

## 2016-11-28 ENCOUNTER — Other Ambulatory Visit: Payer: Self-pay

## 2016-11-28 DIAGNOSIS — R748 Abnormal levels of other serum enzymes: Secondary | ICD-10-CM

## 2016-11-28 DIAGNOSIS — E7800 Pure hypercholesterolemia, unspecified: Principal | ICD-10-CM

## 2016-11-28 MED ORDER — ATORVASTATIN 20 MG TABLET
20.0000 mg | ORAL_TABLET | Freq: Every day | ORAL | 0 refills | Status: DC
  Filled 2016-11-28: qty 30, 30d supply, fill #0

## 2016-11-28 NOTE — Telephone Encounter (Signed)
Please ask Marisa Jenkins to do follow up nonfasting labs since the last liver function tests were abnormal. [name], MD

## 2016-12-01 ENCOUNTER — Other Ambulatory Visit: Payer: Self-pay

## 2016-12-01 NOTE — Telephone Encounter (Signed)
Pt notified via mychart message.

## 2016-12-02 ENCOUNTER — Ambulatory Visit: Payer: Commercial Managed Care - HMO | Attending: Family Medicine

## 2016-12-02 DIAGNOSIS — R748 Abnormal levels of other serum enzymes: Principal | ICD-10-CM | POA: Insufficient documentation

## 2016-12-02 DIAGNOSIS — E7800 Pure hypercholesterolemia, unspecified: Secondary | ICD-10-CM

## 2016-12-02 LAB — HEPATIC FUNCTION PANEL
ALANINE TRANSFERASE (ALT): 56 U/L — AB (ref 5–54)
ALKALINE PHOSPHATASE (ALP): 68 U/L (ref 35–115)
ASPARTATE TRANSAMINASE (AST): 40 U/L (ref 15–43)
Albumin: 4.7 g/dL — ABNORMAL HIGH (ref 3.2–4.6)
BILIRUBIN DIRECT: 0.1 mg/dL (ref 0.0–0.2)
BILIRUBIN TOTAL: 0.9 mg/dL (ref 0.3–1.3)
PROTEIN: 7.7 g/dL (ref 6.3–8.3)

## 2016-12-04 ENCOUNTER — Ambulatory Visit: Payer: Commercial Managed Care - HMO

## 2016-12-04 DIAGNOSIS — J301 Allergic rhinitis due to pollen: Secondary | ICD-10-CM

## 2016-12-04 NOTE — Nursing Note (Signed)
.  Patient here for Immunotherapy.  Patient and antigen were identified with 2 identifiers, including patient's confirmation of correct antigen bottle(s). Per Allergy protocol the patient's condition was then assessed, which included any immediate or delayed reaction to previous dose, current health status, Peak flow reading if indicated, use of any new meds such as antibiotic or Beta Blocker, if antihistamine had been taken within the previous 24 hours, and length of time since last allergy injection(s).    The allergy injection(s) were then administered per the build up schedule.  Patients dose was repeated.(reduced, repeated, advanced).   Patient received in left arm:  0.50 mL of 1:1 Aq Tree/Grass/Weed subcutaneous.  Dilan Fullenwider C Lillyauna Jenkinson, RN    After 30 minutes observation, Patient's injection sites were checked for any local reaction. Patient then left the clinic feeling fine.  Local reaction was 0 (wheal/erythema).  Amila Callies C Dailyn Reith, RN

## 2016-12-25 ENCOUNTER — Other Ambulatory Visit: Payer: Self-pay

## 2016-12-25 ENCOUNTER — Encounter: Payer: Self-pay | Admitting: Family Medicine

## 2016-12-25 NOTE — Telephone Encounter (Signed)
From: [name]  To: [name], MD  Sent: 12/25/2016 9:31 AM PDT  Subject: Preventive Care    I believe it's time to get blood work done for Lipid Panel.

## 2016-12-26 ENCOUNTER — Other Ambulatory Visit: Payer: Self-pay

## 2016-12-30 ENCOUNTER — Ambulatory Visit: Payer: Commercial Managed Care - HMO

## 2016-12-30 ENCOUNTER — Ambulatory Visit (INDEPENDENT_AMBULATORY_CARE_PROVIDER_SITE_OTHER): Payer: Commercial Managed Care - HMO

## 2016-12-30 DIAGNOSIS — J301 Allergic rhinitis due to pollen: Secondary | ICD-10-CM

## 2016-12-30 DIAGNOSIS — Z23 Encounter for immunization: Secondary | ICD-10-CM

## 2016-12-30 NOTE — Nursing Note (Signed)
The Influenza Vaccine VIS document for the flu injection was given to patient to review. Patient or person named in permission has answered no to any history of egg allergy, previous serious reaction to a influenza vaccine or current illness which would preclude them receiving an immunization. Any questions were referred to the physician. The Influenza Vaccine was then administered per protocol. The patient was observed for immediate reactions to the vaccine per protocol. None were observed.   Javonda Suh C Sarha Bartelt, RN

## 2016-12-30 NOTE — Nursing Note (Signed)
Patient here for Immunotherapy.  Patient and antigen were identified with 2 identifiers, including patient's confirmation of correct antigen bottle(s). Per Allergy protocol the patient's condition was then assessed, which included any immediate or delayed reaction to previous dose, current health status, Peak flow reading if indicated, use of any new meds such as antibiotic or Beta Blocker, if antihistamine had been taken within the previous 24 hours, and length of time since last allergy injection(s).    The allergy injection(s) were then administered per the build up schedule.  Patients dose was repeated.(reduced, repeated, advanced).   Patient received in right arm:  0.50 mL of 1:1 Aq Tree/Grass/Weed subcutaneous.  Clovis Mankins C Nancee Brownrigg, RN    After 30 minutes observation, Patient's injection sites were checked for any local reaction. Patient then left the clinic feeling fine.  Local reaction was 0 (wheal/erythema).  Gusta Marksberry C Quinterious Walraven, RN

## 2017-01-06 ENCOUNTER — Other Ambulatory Visit: Payer: Self-pay

## 2017-01-08 ENCOUNTER — Telehealth: Payer: Self-pay | Admitting: Family Medicine

## 2017-01-08 DIAGNOSIS — F411 Generalized anxiety disorder: Secondary | ICD-10-CM

## 2017-01-08 DIAGNOSIS — R7301 Impaired fasting glucose: Principal | ICD-10-CM

## 2017-01-08 NOTE — Telephone Encounter (Signed)
TE created. Marisa Ogas, MA I

## 2017-01-08 NOTE — Telephone Encounter (Signed)
Regarding: Preventive Care  Contact: [phone]  ----- Message from [facility], [place] sent at 12/25/2016 11:49 AM PDT -----       ----- Message from [name] to [name], MD sent at 12/25/2016  9:31 AM -----   I believe it's time to get blood work done for Lipid Panel.

## 2017-01-08 NOTE — Telephone Encounter (Signed)
TE created original message received 12/25/2016. Tiana Loft, [place] I

## 2017-01-09 ENCOUNTER — Encounter: Payer: Self-pay | Admitting: Family Medicine

## 2017-01-12 ENCOUNTER — Ambulatory Visit: Payer: Commercial Managed Care - HMO | Admitting: FAMILY PRACTICE

## 2017-01-12 ENCOUNTER — Telehealth: Payer: Self-pay | Admitting: Family Medicine

## 2017-01-12 ENCOUNTER — Other Ambulatory Visit: Payer: Self-pay

## 2017-01-12 MED ORDER — METOPROLOL TARTRATE 25 MG TABLET
12.5000 mg | ORAL_TABLET | Freq: Two times a day (BID) | ORAL | 0 refills | Status: DC | PRN
Start: 2017-01-12 — End: 2017-01-15
  Filled 2017-01-12: qty 20, 20d supply, fill #0

## 2017-01-12 NOTE — Telephone Encounter (Signed)
3 patient identifiers used.  Per:   patient.      ClearTriage Note: Patient states she feels that her heart has been beating very fast and she has been having SOB as well. Patient states this has been going on for 4 days and states that she has had this episode of palpitations and SOB for the past hour. Patient advised to have someone drive her to ED and she will have someone drive her from work to [employer] now.    Protocol Used: Heart Rate and Heartbeat Questions (Adult)  Protocol-Based Disposition: Go to ED Now    Positive Triage Question:  * Difficulty breathing    Negative Triage Questions:  * Passed out (i.e., lost consciousness, collapsed and was not responding)  * Shock suspected (e.g., cold/pale/clammy skin, too weak to stand, low BP, rapid pulse)  * Difficult to awaken or acting confused (e.g., disoriented, slurred speech)  * Visible sweat on face or sweat dripping down face  * Unable to walk, or can only walk with assistance (e.g., requires support)  * [1] Received SHOCK from implantable cardiac defibrillator AND [2] persisting symptoms (i.e., palpitations, lightheadedness)  * Sounds like a life-threatening emergency to the triager    Disposition: Referred to ER per protocol  Per:   mother and father verbalizes agreement to plan. Agrees to callback with any increase in symptoms/concerns or questions.     Hilary Hertz, RN  PCN

## 2017-01-12 NOTE — Telephone Encounter (Signed)
Patient on the phone having heart palpitations that take her breath away.  Going on for 4 or 5 days but today they wont stop.    Thank you,     Cindy Hazy

## 2017-01-15 ENCOUNTER — Encounter: Payer: Self-pay | Admitting: Family Medicine

## 2017-01-15 ENCOUNTER — Ambulatory Visit: Payer: Commercial Managed Care - HMO | Admitting: Family Medicine

## 2017-01-15 ENCOUNTER — Other Ambulatory Visit: Payer: Self-pay

## 2017-01-15 VITALS — BP 120/60 | HR 77 | Ht 65.0 in

## 2017-01-15 DIAGNOSIS — R748 Abnormal levels of other serum enzymes: Secondary | ICD-10-CM

## 2017-01-15 DIAGNOSIS — E7800 Pure hypercholesterolemia, unspecified: Secondary | ICD-10-CM

## 2017-01-15 DIAGNOSIS — Z79890 Hormone replacement therapy (postmenopausal): Secondary | ICD-10-CM

## 2017-01-15 DIAGNOSIS — I493 Ventricular premature depolarization: Secondary | ICD-10-CM

## 2017-01-15 DIAGNOSIS — Z09 Encounter for follow-up examination after completed treatment for conditions other than malignant neoplasm: Secondary | ICD-10-CM

## 2017-01-15 DIAGNOSIS — L820 Inflamed seborrheic keratosis: Secondary | ICD-10-CM

## 2017-01-15 DIAGNOSIS — K279 Peptic ulcer, site unspecified, unspecified as acute or chronic, without hemorrhage or perforation: Secondary | ICD-10-CM

## 2017-01-15 DIAGNOSIS — Z23 Encounter for immunization: Secondary | ICD-10-CM

## 2017-01-15 DIAGNOSIS — R002 Palpitations: Principal | ICD-10-CM

## 2017-01-15 MED ORDER — ATORVASTATIN 20 MG TABLET
20.0000 mg | ORAL_TABLET | Freq: Every day | ORAL | 3 refills | Status: DC
  Filled 2017-01-15: qty 90, 90d supply, fill #0

## 2017-01-15 MED ORDER — ESTRADIOL 0.025 MG/24 HR SEMIWEEKLY TRANSDERMAL PATCH
1.0000 | MEDICATED_PATCH | TRANSDERMAL | 3 refills | Status: DC
  Filled 2017-01-15 – 2017-02-23 (×2): qty 24, 84d supply, fill #0
  Filled 2017-07-30: qty 24, 84d supply, fill #1

## 2017-01-15 MED ORDER — FAMOTIDINE 40 MG TABLET
40.0000 mg | ORAL_TABLET | Freq: Every day | ORAL | 3 refills | Status: DC
Start: 2017-01-15 — End: 2018-02-02
  Filled 2017-01-15: qty 90, 90d supply, fill #0
  Filled 2017-04-28: qty 90, 90d supply, fill #1
  Filled 2017-07-30: qty 68, 68d supply, fill #2
  Filled 2017-07-30: qty 22, 22d supply, fill #2
  Filled 2017-11-02: qty 90, 90d supply, fill #3

## 2017-01-15 MED ORDER — METOPROLOL TARTRATE 25 MG TABLET
12.5000 mg | ORAL_TABLET | Freq: Two times a day (BID) | ORAL | 3 refills | Status: DC
Start: 2017-01-15 — End: 2017-07-14
  Filled 2017-01-15 – 2017-02-02 (×2): qty 90, 90d supply, fill #0

## 2017-01-15 NOTE — Nursing Note (Signed)
Vitals taken, allergies, tobacco history & pharmacy reviewed, pain level recorded.     Yeilyn Gent Webster-Kasper MA

## 2017-01-15 NOTE — Nursing Note (Signed)
Immunization VIS documentation(s) were given to Marisa Jenkins to review. All questions were answered and the patient consented to the Immunization(s) being given. Patient allergies were reviewed and no contraindications were found. The immunization(s) were given as ordered. The patient was observed for any immediate reactions to the vaccine. None were observed. Smith Mince, LVN

## 2017-01-15 NOTE — Patient Instructions (Addendum)
Premature Heartbeat: Care Instructions  Your Care Instructions     A premature heartbeat happens when the heart beats earlier than it should. This briefly interrupts the heart's rhythm. You do not usually feel the early heartbeat, and the next beat is stronger. To many people, this feels like a skipped heartbeat or a flutter.  If you have no heart problems, premature heartbeats are not a cause for concern. Most people have them at some time. They may happen more often if you drink a lot of caffeine or alcohol or are under stress.  Usually, no cause for a premature heartbeat is found, and no treatment is needed.  Follow-up care is a key part of your treatment and safety. Be sure to make and go to all appointments, and call your doctor if you are having problems. It's also a good idea to know your test results and keep a list of the medicines you take.  How can you care for yourself at home?   Limit caffeine and other stimulants if they trigger premature heartbeats.   Reduce stress. Avoid people and places that make you feel anxious, if you can. Learn ways to reduce stress, such as biofeedback, guided imagery, and meditation.   Do not smoke or allow others to smoke around you. If you need help quitting, talk to your doctor about stop-smoking programs and medicines. These can increase your chances of quitting for good.   Get at least 30 minutes of exercise on most days of the week. Walking is a good choice. You also may want to do other activities, such as running, swimming, cycling, or playing tennis or team sports.   Get enough sleep. Keep your room dark and quiet, and try to go to bed at the same time every night.   Limit alcohol to 2 drinks a day for men and 1 drink a day for women. Too much alcohol can cause health problems. If drinking alcohol causes more premature heartbeats, do not drink it.   If your doctor prescribes medicine, take it exactly as prescribed. Call your doctor if you think you are  having a problem with your medicine.  When should you call for help?  Call 911 anytime you think you may need emergency care. For example, call if:   You passed out (lost consciousness).   You have severe shortness of breath.  Call your doctor now or seek immediate medical care if:   You have a heart rate that stays above 120 beats per minute for more than a few minutes.   You are suddenly dizzy.  Watch closely for changes in your health, and be sure to contact your doctor if you have any problems.   Where can you learn more?   Go to http://blackburn.com/  Enter H908 in the search box to learn more about "Premature Heartbeat: Care Instructions."    2006-2015 Healthwise, Incorporated. Care instructions adapted under license by [facility]. This care instruction is for use with your licensed healthcare professional. If you have questions about a medical condition or this instruction, always ask your healthcare professional. [place] any warranty or liability for your use of this information.  Content Version: 10.6.465758; Current as of: June 30, 2013              Seborrheic Keratosis: Care Instructions  Your Care Instructions  Seborrheic keratoses are raised skin growths that look scaly or warty. They usually look like they were stuck onto the skin. They  most often grow in groups on the back or chest and are more common in older people. A seborrheic keratosis can be tan or dark brown. A seborrheic keratosis is not a mole and never turns into cancer. But it is still a good idea to check your skin regularly.  Sometimes a seborrheic keratosis can itch. Scratching it can cause it to bleed and sometimes even scar.  A seborrheic keratosis is removed only if it bothers you. The doctor will freeze it or scrape it off with a tool. The doctor can also use a laser to remove a seborrheic keratosis. Treatment usually results in normal-looking skin, but it can leave a light  or dark mark or even a scar on the skin.  Follow-up care is a key part of your treatment and safety. Be sure to make and go to all appointments, and call your doctor if you are having problems. It's also a good idea to know your test results and keep a list of the medicines you take.  How can you care for yourself at home?   If clothing irritates your seborrheic keratosis, cover it with a bandage to prevent rubbing and bleeding.   If you have a seborrheic keratosis removed, clean the area with soap and water two times a day unless your doctor gives you different instructions. Don't use hydrogen peroxide or alcohol, which can slow healing.   You may cover the wound with a thin layer of petroleum jelly, such as Vaseline, and a nonstick bandage.   Check all the skin on your body once a month for skin growths or other changes, such as color and feel of the skin.   Stand in front of a full-length mirror. Look carefully at the front and back of your body. Then look at your right and left sides with your arms raised.   Bend your elbows and look carefully at your forearms, the back of your upper arms, and your palms.   Look at your feet, the soles of your feet, and the spaces between your toes.   Use a hand mirror to look at the back of your legs, the back of your neck, and your back, rear end (buttocks), and genital area. Part the hair on your head to look at your scalp.   If you see a change in a skin growth, contact your doctor. Look for:   A mole that bleeds.   A fast-growing mole.   A scaly or crusted growth on the skin.   A sore that will not heal.  When should you call for help?  Call your doctor now or seek immediate medical care if:   You have an area of normal skin that suddenly changes in shape, size, or how it looks.   Your skin is badly broken from scratching.   You have signs of infection such as:   Pain, warmth, or swelling in your skin.   Red streaks near a wound in your skin.   Pus coming  from a wound in your skin.   A fever not due to the flu or other illness.  Watch closely for changes in your health, and be sure to contact your doctor if:   You do not get better as expected.   Where can you learn more?   Go to http://blackburn.com/  Enter [phone] in the search box to learn more about "Seborrheic Keratosis: Care Instructions."    2006-2015 Healthwise, Incorporated. Care instructions adapted under license by [facility]. This care instruction is for use with your licensed healthcare professional. If you have questions about a medical condition or this instruction, always ask your healthcare professional. [place] any warranty or liability for your use of this information.  Content Version: 10.6.465758; Current as of: April 29, 2013

## 2017-01-15 NOTE — Progress Notes (Addendum)
01/15/2017   EPIC FAMILY MEDICINE VISIT: MRN# [id number]  Name: Marisa Jenkins  Date of birth: 1955/10/24  Primary care physician is [name], MD     Chief Complaint   Patient presents with    Hospital Follow Up         SUBJECTIVE:  Marisa Jenkins is a 61yr old female who is here for a follow-up from a recent emergency room evaluation on November 5 for a complaint of palpitations, and mild shortness of breath.  She was found to have frequent premature ventricular complexes, but no cardiovascular instability, and oxygen saturation was 98%.  Chest x-ray showed no significant abnormality.  She was seen at [facility] on November 5 for these symptoms, and was told likely symptoms are due to anxiety, and PVCs.  She has had a Holter monitor in the past, which also showed premature ventricular contractions.  She denies any change in diet, and drinks 1 cup of coffee per day.  Has been trying to cut back, but feels too tired.    At the emergency room, she was found to have elevated liver enzymes she usually drinks about 1-2 drinks at a time, and 3 to 5 drinks per week, and no recent changes.    She has lowered the estrogen supplement, and would like a refill on the lowest dose.  It does help her feel better overall, and helps with hot flashes.    She has one spot on her skin on the left side of her face which has been darker, and would like it treated with liquid nitrogen since it has a tendency to be traumatized at the border of her hairline on the left face.  She is not sure, but thinks it did get slightly bigger and rough on the surface.  It has been there for the past few months.    She would also like to have refills for stomach medicine since she has a history of peptic ulcer disease, and elevated cholesterol and had stopped taking her cholesterol medication for several months, and her liver enzymes are actually higher instead of better.  She denies any nausea vomiting, and does not usually get any significant  abdominal pain unless she stopped taking ranitidine or stomach acid medicine.  She was taking aspirin daily, and has stopped taking that.              Patient Active Problem List   Diagnosis    Hyperlipidemia    Postmenopausal HRT (hormone replacement therapy)    Migraine headache    Allergic rhinitis    Anxiety disorder    Impaired fasting glucose    Pain in joint of right hand    Internal hemorrhoids    Allergic rhinitis due to pollen    Cerumen impaction    Injury of right foot    Libido, decreased    Pain, abdominal    Tubular adenoma    Polyp of colon    Elevated liver enzymes    Rectal cancer (HCC)    Multifocal PVCs with pairing    Allergic conjunctivitis of both eyes    Dysfunction of left eustachian tube       REVIEW of SYSTEMS :  Review of Systems   Constitutional: Negative.    HENT: Negative.    Eyes: Negative.    Respiratory: Negative.    Cardiovascular: Positive for palpitations. Negative for chest pain and leg swelling.   Gastrointestinal: Negative.    Endocrine: Positive  for cold intolerance.   Genitourinary: Negative.    Musculoskeletal: Negative.    Skin: Positive for color change.   Allergic/Immunologic: Positive for environmental allergies.   Neurological: Negative.    Psychiatric/Behavioral: The patient is nervous/anxious.         OBJECTIVE:  Vital signs:    BP 120/60 (SITE: right arm, Orthostatic Position: sitting, Cuff Size: regular)   Pulse 77   Ht 1.651 m (5\' 5" )   LMP 10/28/2010   BMI 25.46 kg/m    General Appearance: healthy, alert, no distress, pleasant affect, cooperative.  Mouth: normal.  Neck:  Neck supple. No adenopathy, thyroid symmetric, normal size.  Heart:  normal rate and regular rhythm, no murmurs, clicks, or gallops.  Lungs: clear to auscultation.  Abdomen: BS normal.  Abdomen soft, non-tender.  No masses or organomegaly.  Extremities:  no cyanosis, clubbing, or edema and distal pulses normal.  Skin:  Skin exam shows seborrheic keratosis on the left side  of the face/ cheek/temple area  with patient's observations stated as history of lesion unknown, it has simply been noticed recently, exam of this area shows seborrheic keratosis, features sharp border, raised, nonulcerated, well demarcated borders, brown/ tan, pasted on appearance, no tenderness or crusting, size 4 mm.  .  Mental Status: mildly anxious, denies depression        ASSESSMENT:  (R00.2) Palpitations  (primary encounter diagnosis)  Comment:   Plan: Metoprolol Tartrate (LOPRESSOR) 25 mg Tablet            (I49.3) PVC (premature ventricular contraction)  Comment:   Plan: Metoprolol Tartrate (LOPRESSOR) 25 mg Tablet            (R74.8) Elevated liver enzymes  Comment:   Plan: US ABDOMEN, COMPLETE            (L82.0) Seborrheic keratosis, inflamed  Comment: CRYOTHERAPY: Verbal informed consent for cryotherapy was obtained after cryotherapy was explained. Risks include pain, blistering, skin pigment changes, skin infection, scarring and failure to resolve the condition. [name] indicates she understands and agrees to proceed. The left face lesion as described above destroyed with liquid nitrogen cryotherapy. The procedure was tolerated well. There were no complications. Postprocedural care was discussed. Return in 2-4 weeks if lesion(s) don't resolve.   Plan: DESTRUCTION BENIGN LESIONS UP TO 14            (Z23) Need for shingles vaccine  Comment:   Plan: ZOSTER VACCINE (RECOMBINANT), ZOSTER VACCINE         (RECOMBINANT)            (Z79.890) Postmenopausal HRT (hormone replacement therapy)  Comment:   Plan: Estradiol Biweekly 0.025 mg/24 hr (VIVELLE-DOT)        Patch            (E78.00) Pure hypercholesterolemia  Comment:   Plan: Atorvastatin (LIPITOR) 20 mg Tablet            (K27.9) PUD (peptic ulcer disease)  Comment:   Plan: FamoTIDine (PEPCID) 40 mg Tablet            Return to clinic: 2 months for next shingles vaccine and soonet if symptoms worsen.   Advised to call back if symptoms fail to improve as  expected or worsen.     Total visit time: 25 minutes, of which  50 % was spent face-to-face counseling about the following diagnoses/ health problems:    1. Palpitations    2. PVC (premature ventricular contraction)    3.  Elevated liver enzymes    4. Seborrheic keratosis, inflamed    5. Need for shingles vaccine    6. Postmenopausal HRT (hormone replacement therapy)    7. Pure hypercholesterolemia    8. PUD (peptic ulcer disease)       Barriers to Learning assessed: none. Patient verbalizes understanding of teaching and instructions and agrees with the plan of care.    I reviewed the patient's past medical and family/social history, and updated problem list .  Barriers to Learning assessed: none. Patient verbalizes understanding of teaching and instructions.  [name], MD  Family Practice, [facility]  [facility]     This document has been created using Dragon Naturally Speaking and has been checked for content. Some voice recognition errors may have been missed inadvertantly.  Electronically signed by:  [name], MD  Family Practice Department/ [facility]

## 2017-01-16 ENCOUNTER — Other Ambulatory Visit: Payer: Self-pay

## 2017-01-19 ENCOUNTER — Encounter: Payer: Self-pay | Admitting: Family Medicine

## 2017-01-19 DIAGNOSIS — L820 Inflamed seborrheic keratosis: Secondary | ICD-10-CM | POA: Insufficient documentation

## 2017-01-19 DIAGNOSIS — R002 Palpitations: Secondary | ICD-10-CM | POA: Insufficient documentation

## 2017-01-27 ENCOUNTER — Other Ambulatory Visit: Payer: Self-pay

## 2017-01-28 ENCOUNTER — Other Ambulatory Visit: Payer: Self-pay

## 2017-02-02 ENCOUNTER — Other Ambulatory Visit: Payer: Self-pay

## 2017-02-05 ENCOUNTER — Ambulatory Visit: Payer: Commercial Managed Care - HMO

## 2017-02-05 DIAGNOSIS — J301 Allergic rhinitis due to pollen: Secondary | ICD-10-CM

## 2017-02-05 NOTE — Nursing Note (Addendum)
Patient here for Immunotherapy.  Patient and antigen were identified with 2 identifiers, including patient's confirmation of correct antigen bottle(s). Per Allergy protocol the patient's condition was then assessed, which included any immediate or delayed reaction to previous dose, current health status, Peak flow reading if indicated, use of any new meds such as antibiotic or Beta Blocker, if antihistamine had been taken within the previous 24 hours, and length of time since last allergy injection(s).    The allergy injection(s) were then administered per the build up schedule.  Patients dose was decreased as it has been 5 1/2 weeks since her last injection.(reduced, repeated, advanced).   Patient received 0.30 mL of 1:1 Aq Tree/Grass/Weed subcutaneous in left arm.  Suzan Slick, RN    After 30 minutes observation, Patient's injection sites were checked for any local reaction. Patient then left the clinic feeling fine.  Local reaction was 0 (wheal/erythema).  Suzan Slick, RN

## 2017-02-23 ENCOUNTER — Ambulatory Visit: Payer: Commercial Managed Care - HMO | Attending: Family Medicine

## 2017-02-23 ENCOUNTER — Other Ambulatory Visit: Payer: Self-pay | Admitting: Family Medicine

## 2017-02-23 ENCOUNTER — Encounter: Payer: Self-pay | Admitting: Family Medicine

## 2017-02-23 ENCOUNTER — Other Ambulatory Visit: Payer: Self-pay

## 2017-02-23 DIAGNOSIS — F411 Generalized anxiety disorder: Secondary | ICD-10-CM | POA: Insufficient documentation

## 2017-02-23 DIAGNOSIS — R7301 Impaired fasting glucose: Principal | ICD-10-CM | POA: Insufficient documentation

## 2017-02-23 LAB — LIPID PANEL
CHOLESTEROL: 220 mg/dL — AB (ref 0–200)
HDL CHOLESTEROL: 56 mg/dL (ref >=35)
LDL CHOLESTEROL CALCULATION: 146 mg/dL — AB (ref <130)
NON-HDL CHOLESTEROL: 164 mg/dL — AB (ref <=150)
TOTAL CHOLESTEROL:HDL RATIO: 3.9 (ref <4.0)
TRIGLYCERIDE: 91 mg/dL (ref 35–160)

## 2017-02-23 LAB — COMPREHENSIVE METABOLIC PANEL
ALANINE TRANSFERASE (ALT): 60 U/L — AB (ref 5–54)
ALBUMIN: 4.6 g/dL (ref 3.2–4.6)
ALKALINE PHOSPHATASE (ALP): 92 U/L (ref 35–115)
ASPARTATE TRANSAMINASE (AST): 52 U/L — AB (ref 15–43)
BILIRUBIN TOTAL: 0.7 mg/dL (ref 0.3–1.3)
CALCIUM: 9.5 mg/dL (ref 8.6–10.5)
CARBON DIOXIDE TOTAL: 23 mmol/L — AB (ref 24–32)
CHLORIDE: 101 mmol/L (ref 95–110)
CREATININE BLOOD: 0.69 mg/dL (ref 0.44–1.27)
GLUCOSE: 117 mg/dL — AB (ref 70–99)
POTASSIUM: 3.9 mmol/L (ref 3.3–5.0)
PROTEIN: 7.4 g/dL (ref 6.3–8.3)
SODIUM: 138 mmol/L (ref 135–145)
UREA NITROGEN, BLOOD (BUN): 14 mg/dL (ref 8–22)

## 2017-02-23 LAB — HEMOGLOBIN A1C
HGB A1C,GLUCOSE EST AVG: 120 mg/dL
HGB A1C: 5.8 % — AB (ref 3.9–5.6)

## 2017-02-23 NOTE — Telephone Encounter (Signed)
From: Marisa Jenkins  To: [name], MD  Sent: 02/23/2017 11:17 AM PST  Subject: Non-urgent Medical Advice Question    I have had a nagging cough for 4 days and the only thing that seems to help is the Benzonatate, which is actually my husband's prescription. My I request a prescription for this or do I need to make an appointment?

## 2017-02-23 NOTE — Telephone Encounter (Signed)
CONSULT MD:  Patient is requesting Benzonatate be sent in to pharmacy. Please advise.     Annett Fabian, RN  PCN Triage

## 2017-02-24 ENCOUNTER — Other Ambulatory Visit: Payer: Self-pay

## 2017-02-24 MED ORDER — BUTALBITAL-ASPIRIN-CAFFEINE 50 MG-325 MG-40 MG CAPSULE
1.0000 | ORAL_CAPSULE | Freq: Four times a day (QID) | ORAL | 0 refills | Status: DC | PRN
  Filled 2017-02-24: qty 25, 7d supply, fill #0

## 2017-02-25 ENCOUNTER — Other Ambulatory Visit: Payer: Self-pay

## 2017-03-06 ENCOUNTER — Encounter: Payer: Self-pay | Admitting: Family Medicine

## 2017-03-06 ENCOUNTER — Ambulatory Visit
Admission: RE | Admit: 2017-03-06 | Discharge: 2017-03-06 | Disposition: A | Discharge status: Home / Self Care | Payer: Commercial Managed Care - HMO | Source: Ambulatory Visit | Attending: Family Medicine | Admitting: Family Medicine

## 2017-03-06 DIAGNOSIS — R748 Abnormal levels of other serum enzymes: Secondary | ICD-10-CM | POA: Insufficient documentation

## 2017-03-06 DIAGNOSIS — K760 Fatty (change of) liver, not elsewhere classified: Secondary | ICD-10-CM

## 2017-03-06 DIAGNOSIS — N200 Calculus of kidney: Secondary | ICD-10-CM | POA: Insufficient documentation

## 2017-03-06 DIAGNOSIS — R945 Abnormal results of liver function studies: Secondary | ICD-10-CM

## 2017-03-06 DIAGNOSIS — R932 Abnormal findings on diagnostic imaging of liver and biliary tract: Secondary | ICD-10-CM

## 2017-03-06 HISTORY — DX: Calculus of kidney: N20.0

## 2017-03-06 HISTORY — DX: Fatty (change of) liver, not elsewhere classified: K76.0

## 2017-03-11 ENCOUNTER — Other Ambulatory Visit: Payer: Self-pay

## 2017-03-12 ENCOUNTER — Other Ambulatory Visit: Payer: Self-pay

## 2017-03-12 MED ORDER — BENZONATATE 100 MG CAPSULE
100.0000 mg | ORAL_CAPSULE | Freq: Three times a day (TID) | ORAL | 0 refills | Status: DC | PRN
  Filled 2017-03-12: qty 40, 7d supply, fill #0

## 2017-03-16 ENCOUNTER — Encounter: Payer: Self-pay | Admitting: Family Medicine

## 2017-03-16 NOTE — Telephone Encounter (Signed)
From: [name]  To: [name], MD  Sent: 03/15/2017 5:18 PM PST  Subject: MyChart Refill Request    I am requesting a re-fill of Diazepam

## 2017-03-17 ENCOUNTER — Other Ambulatory Visit: Payer: Self-pay

## 2017-03-17 MED ORDER — DIAZEPAM 5 MG TABLET
5.0000 mg | ORAL_TABLET | Freq: Four times a day (QID) | ORAL | 0 refills | Status: AC | PRN
  Filled 2017-03-17: qty 30, 8d supply, fill #0

## 2017-03-24 ENCOUNTER — Ambulatory Visit: Payer: Commercial Managed Care - HMO

## 2017-03-24 DIAGNOSIS — J301 Allergic rhinitis due to pollen: Secondary | ICD-10-CM

## 2017-03-24 NOTE — Nursing Note (Signed)
Patient here for Immunotherapy.  Patient and antigen were identified with 2 identifiers, including patient's confirmation of correct antigen bottle(s).  Per Immunotherapy Protocol patient's condition was then assessed, which included any immediate or delayed reaction to previous dose, current health status, Peak flow reading if indicated, use of any new meds such as antibiotic or Beta Blocker, if antihistamine had been taken within the previous 24 hours, and length of time since last allergy injection(s).      The allergy injection(s) were then administered per the Immunotherapy Protcol build up schedule.  Patient's dose was (reduced, repeated, advanced) reduced.  Patient received in  Right arm: Aqueous Trees/Grass/Weeds 1:1 (Antigen Name,Concentration & Dose)  0.15  mL subq. After 30 minutes observation, Patient's injection sites were checked for any local reaction, and patient then left the clinic feeling fine.  Local reaction (wheal/erythema)  No reaction.      Injection tolerated well patient denies itching or other symptoms.   Orpah Melter, RN

## 2017-04-07 ENCOUNTER — Ambulatory Visit: Payer: Commercial Managed Care - HMO

## 2017-04-07 ENCOUNTER — Other Ambulatory Visit: Payer: Self-pay

## 2017-04-07 DIAGNOSIS — J301 Allergic rhinitis due to pollen: Secondary | ICD-10-CM

## 2017-04-07 NOTE — Nursing Note (Addendum)
Patient here for Immunotherapy.  Patient and antigen were identified with 2 identifiers, including patient's confirmation of correct antigen bottle(s). Per Allergy protocol the patient's condition was then assessed, which included any immediate or delayed reaction to previous dose, current health status, Peak flow reading if indicated, use of any new meds such as antibiotic or Beta Blocker, if antihistamine had been taken within the previous 24 hours, and length of time since last allergy injection(s).    The allergy injection(s) were then administered per the build up schedule.  Patients dose was advanced.(reduced, repeated, advanced).   Patient received in left arm:  0.20 mL of 1:1 Aq Tree/Grass/Weed subcutaneous.  Suzan Slick, RN    After 30 minutes observation, Patient's injection sites were checked for any local reaction. Patient then left the clinic feeling fine.  Local reaction was 0.  Suzan Slick, RN

## 2017-04-16 ENCOUNTER — Ambulatory Visit: Payer: Commercial Managed Care - HMO

## 2017-04-16 DIAGNOSIS — J301 Allergic rhinitis due to pollen: Secondary | ICD-10-CM

## 2017-04-16 NOTE — Nursing Note (Addendum)
fPatient here for Immunotherapy.  Patient and antigen were identified with 2 identifiers, including patient's confirmation of correct antigen bottle(s). Per Allergy protocol the patient's condition was then assessed, which included any immediate or delayed reaction to previous dose, current health status, Peak flow reading if indicated, use of any new meds such as antibiotic or Beta Blocker, if antihistamine had been taken within the previous 24 hours, and length of time since last allergy injection(s).    The allergy injection(s) were then administered per the build up schedule.  Patients dose was advanced.(reduced, repeated, advanced).   Patient received in right arm:  0.30 mL of 1:1 Aq Tree/Grass/Weed subcutaneous.  Suzan Slick, RN    After 30 minutes observation, Patient's injection sites were checked for any local reaction. Patient then left the clinic feeling fine.  Local reaction was 1/7 (wheal/erythema).  Suzan Slick, RN

## 2017-04-23 ENCOUNTER — Ambulatory Visit: Payer: Commercial Managed Care - HMO

## 2017-04-28 ENCOUNTER — Other Ambulatory Visit: Payer: Self-pay

## 2017-04-30 ENCOUNTER — Ambulatory Visit: Payer: Commercial Managed Care - HMO

## 2017-04-30 DIAGNOSIS — J301 Allergic rhinitis due to pollen: Secondary | ICD-10-CM

## 2017-04-30 NOTE — Nursing Note (Signed)
Patient here for Immunotherapy.  Patient and antigen were identified with 2 identifiers, including patient's confirmation of correct antigen bottle(s). Per Allergy protocol the patient's condition was then assessed, which included any immediate or delayed reaction to previous dose, current health status, Peak flow reading if indicated, use of any new meds such as antibiotic or Beta Blocker, if antihistamine had been taken within the previous 24 hours, and length of time since last allergy injection(s).    The allergy injection(s) were then administered per the build up schedule.  Patients dose was advanced.(reduced, repeated, advanced).   Patient received in left arm:  0.40 mL of 1:1 Aq Tree/Grass/Weed subcutaneous.  Ercel Pepitone C Shainna Faux, RN    After 30 minutes observation, Patient's injection sites were checked for any local reaction. Patient then left the clinic feeling fine.  Local reaction was 0 (wheal/erythema).  Guthrie Lemme C Hendel Gatliff, RN

## 2017-05-12 ENCOUNTER — Ambulatory Visit: Payer: Commercial Managed Care - HMO

## 2017-05-12 ENCOUNTER — Other Ambulatory Visit: Payer: Self-pay

## 2017-05-12 DIAGNOSIS — J301 Allergic rhinitis due to pollen: Secondary | ICD-10-CM

## 2017-05-12 NOTE — Nursing Note (Signed)
Patient here for Immunotherapy.  Patient and antigen were identified with 2 identifiers, including patient's confirmation of correct antigen bottle(s). Per Allergy protocol the patient's condition was then assessed, which included any immediate or delayed reaction to previous dose, current health status, Peak flow reading if indicated, use of any new meds such as antibiotic or Beta Blocker, if antihistamine had been taken within the previous 24 hours, and length of time since last allergy injection(s).    The allergy injection(s) were then administered per the build up schedule.  Patients dose was advanced.(reduced, repeated, advanced).   Patient received in right arm:  0.50 mL of 1:1 Aq Tree/Grass/Weed subcutaneous.  Suzan Slick, RN    After 30 minutes observation, Patient's injection sites were checked for any local reaction. Patient then left the clinic feeling fine.  Local reaction was 2/23 (wheal/erythema).  Suzan Slick, RN

## 2017-05-19 ENCOUNTER — Encounter: Payer: Self-pay | Admitting: Rheumatology

## 2017-05-19 ENCOUNTER — Ambulatory Visit: Payer: Commercial Managed Care - HMO | Admitting: Rheumatology

## 2017-05-19 VITALS — BP 131/79 | HR 76 | Temp 97.9°F | Resp 18 | Ht 64.0 in | Wt 144.6 lb

## 2017-05-19 DIAGNOSIS — J301 Allergic rhinitis due to pollen: Secondary | ICD-10-CM

## 2017-05-19 NOTE — Progress Notes (Signed)
The patient is seen in follow-up for her allergic rhinitis.  The patient is into year 5 of her immunotherapy which she tolerates with minimal reaction.  She feels it is of benefit.  She takes cetirizine regularly but fluticasone nasal spray rarely.  She has not had any asthma.    Examination: Per homunculus; notable for minimal nasal mucosal swelling but clear chest.    Assessment/plan: Allergic rhinitis.  The patient elects to complete year 5 of her immunotherapy and then discontinue.  She may use the cetirizine and fluticasone nasal spray for breakthrough symptoms.

## 2017-05-19 NOTE — Nursing Note (Signed)
Vital signs taken, allergies verified, screened for pain, med hx taken,  screened for chicken pox, and verified immunization status.  Cathyrn Deas, MA

## 2017-05-26 ENCOUNTER — Ambulatory Visit: Payer: Commercial Managed Care - HMO

## 2017-05-26 DIAGNOSIS — J301 Allergic rhinitis due to pollen: Secondary | ICD-10-CM

## 2017-05-26 NOTE — Nursing Note (Signed)
Patient here for Immunotherapy.  Patient and antigen were identified with 2 identifiers, including patient's confirmation of correct antigen bottle(s). Per Allergy protocol the patient's condition was then assessed, which included any immediate or delayed reaction to previous dose, current health status, Peak flow reading if indicated, use of any new meds such as antibiotic or Beta Blocker, if antihistamine had been taken within the previous 24 hours, and length of time since last allergy injection(s).    The allergy injection(s) were then administered per the build up schedule.  Patients dose was repeated.(reduced, repeated, advanced).   Patient received in left arm:  0.50 mL of 1:1 Aq Tree/Grass/Weed subcutaneous.  Suzan Slick, RN    After 30 minutes observation, Patient's injection sites were checked for any local reaction. Patient then left the clinic feeling fine.  Local reaction was 0/43 (wheal/erythema).  Suzan Slick, RN

## 2017-06-01 ENCOUNTER — Ambulatory Visit: Payer: Commercial Managed Care - HMO | Admitting: Family Medicine

## 2017-06-01 ENCOUNTER — Other Ambulatory Visit: Payer: Self-pay

## 2017-06-01 ENCOUNTER — Encounter: Payer: Self-pay | Admitting: Family Medicine

## 2017-06-01 VITALS — BP 153/97 | HR 89 | Temp 97.8°F | Resp 16 | Wt 145.7 lb

## 2017-06-01 DIAGNOSIS — H6123 Impacted cerumen, bilateral: Secondary | ICD-10-CM

## 2017-06-01 DIAGNOSIS — H66002 Acute suppurative otitis media without spontaneous rupture of ear drum, left ear: Principal | ICD-10-CM

## 2017-06-01 MED ORDER — AZITHROMYCIN 250 MG TABLET
ORAL_TABLET | ORAL | 0 refills | Status: AC
Start: 2017-06-01 — End: 2017-06-06
  Filled 2017-06-01: qty 6, 5d supply, fill #0

## 2017-06-01 NOTE — Nursing Note (Signed)
Ear lavage done per MDs request on both ears using the elephant ear lavage bottle with warm water. Removed moderate amount or crumen from both ears, patient tolerated well. Jaquita Rector LVN

## 2017-06-01 NOTE — Nursing Note (Signed)
Vital signs taken, allergies verified, screened for pain.  Patti Shorb, LVN

## 2017-06-01 NOTE — Progress Notes (Signed)
Chief Complaint   Patient presents with    Ear Problem     pain     Subjective   complains of bilateral ear pain after flying from [place] associated with runny nose and coughing X 3 days.     PHQ-2 01/15/2017   Interest 0   Feeling 0   PHQ-2 Total Score 0       Current Outpatient Medications on File Prior to Visit:     Albuterol (PROAIR HFA, PROVENTIL HFA, VENTOLIN HFA) 90 mcg/actuation inhaler    Aspirin 325mg /Caffeine 40mg /Butalbital 50mg  (FIORINAL) 50-325-40 mg per capsule    Atorvastatin (LIPITOR) 20 mg Tablet    Benzonatate (TESSALON) 100 mg Capsule    Cetirizine (ZYRTEC) 10 mg Tablet    Cholecalciferol (VITAMIN D) 1,000 unit Capsule    Cyanocobalamin (VITAMIN B-12) 1,000 mcg Sublingual Tablet    Estradiol Biweekly 0.025 mg/24 hr (VIVELLE-DOT) Patch    FamoTIDine (PEPCID) 40 mg Tablet    Fluticasone (FLONASE) 50 mcg/actuation nasal spray    Metoprolol Tartrate (LOPRESSOR) 25 mg Tablet    Omega-3-DHA-EPA-Fish Oil (FISH OIL) 1,000 mg (120 mg-180 mg) Capsule    Progesterone (PROMETRIUM) 100 mg Capsule    Review of Systems   Constitutional: Positive for fever.   HENT: Positive for congestion and rhinorrhea.    Respiratory: Positive for cough.      Physical Exam   Constitutional: She appears well-developed and well-nourished.   BP (!) 153/97 (SITE: right arm, Orthostatic Position: sitting, Cuff Size: regular)   Pulse 89   Temp 36.6 C (97.8 F) (Temporal)   Resp 16   Wt 66.1 kg (145 lb 11.6 oz)   LMP 10/28/2010   BMI 25.01 kg/m     HENT:   Right Ear: Tympanic membrane and external ear normal.   Left Ear: External ear normal. Tympanic membrane is erythematous.   Mouth/Throat: Oropharynx is clear and moist and mucous membranes are normal.   Bilateral cerumen impaction prior to ear lavage.    Eyes: Conjunctivae are normal.   Cardiovascular: Normal rate and regular rhythm.   Pulmonary/Chest: Effort normal and breath sounds normal.   Lymphadenopathy:     She has no cervical adenopathy.    Psychiatric: Her behavior is normal. Thought content normal.     Assessment     ICD-10-CM    1. Bilateral impacted cerumen H61.23 REMOVE IMPACTED EAR WAX-BACK OFFICE STAFF   2. Acute suppurative otitis media of left ear without spontaneous rupture of tympanic membrane, recurrence not specified H66.002 Azithromycin (ZITHROMAX Z-PAK) 250 mg Tablet       Call or return to clinic prn if these symptoms worsen or fail to improve as anticipated.      Electronically signed by:     Coralie Keens, MD, [facility]  Clinical professor   [facility] and community Medicine   [address]  [place], [place] [address]

## 2017-06-07 ENCOUNTER — Other Ambulatory Visit: Payer: Self-pay | Admitting: Family Medicine

## 2017-06-08 ENCOUNTER — Other Ambulatory Visit: Payer: Self-pay

## 2017-06-08 MED ORDER — PROGESTERONE MICRONIZED 100 MG CAPSULE
100.0000 mg | ORAL_CAPSULE | Freq: Every day | ORAL | 0 refills | Status: DC
Start: 2017-06-08 — End: 2017-07-13
  Filled 2017-06-08: qty 30, 30d supply, fill #0

## 2017-06-12 ENCOUNTER — Ambulatory Visit: Payer: Commercial Managed Care - HMO

## 2017-06-12 DIAGNOSIS — J301 Allergic rhinitis due to pollen: Secondary | ICD-10-CM

## 2017-06-12 NOTE — Nursing Note (Signed)
Patient here for Immunotherapy.  Patient and antigen were identified with 2 identifiers, including patient's confirmation of correct antigen bottle(s). Per Allergy protocol the patient's condition was then assessed, which included any immediate or delayed reaction to previous dose, current health status, Peak flow reading if indicated, use of any new meds such as antibiotic or Beta Blocker, if antihistamine had been taken within the previous 24 hours, and length of time since last allergy injection(s).    The allergy injection(s) were then administered per the build up schedule.  Patients dose was repeated.(reduced, repeated, advanced).   Patient received in right arm:  0.50 mL of 1:1 Aq Tree/Grass/Weed subcutaneous.  Suzan Slick, RN    After 30 minutes observation, Patient's injection sites were checked for any local reaction. Patient then left the clinic feeling fine.  Local reaction was 4/32 (wheal/erythema).  Suzan Slick, RN

## 2017-06-26 ENCOUNTER — Ambulatory Visit: Payer: Commercial Managed Care - HMO

## 2017-06-26 DIAGNOSIS — J301 Allergic rhinitis due to pollen: Secondary | ICD-10-CM

## 2017-06-26 NOTE — Nursing Note (Signed)
Patient here for Immunotherapy.  Patient and antigen were identified with 2 identifiers, including patient's confirmation of correct antigen bottle(s). Per Allergy protocol the patient's condition was then assessed, which included any immediate or delayed reaction to previous dose, current health status, Peak flow reading if indicated, use of any new meds such as antibiotic or Beta Blocker, if antihistamine had been taken within the previous 24 hours, and length of time since last allergy injection(s).    The allergy injection(s) were then administered per the build up schedule.  Patients dose was repeated.(reduced, repeated, advanced).   Patient received in left arm:  0.50 mL of 1:1 Aq Tree/Grass/Weed subcutaneous.  Jeiden Daughtridge C Shalini Mair, RN    After 30 minutes observation, Patient's injection sites were checked for any local reaction. Patient then left the clinic feeling fine.  Local reaction was 0 (wheal/erythema).  Aquilla Shambley C Rana Adorno, RN

## 2017-06-29 ENCOUNTER — Other Ambulatory Visit: Payer: Self-pay | Admitting: Family Medicine

## 2017-06-29 ENCOUNTER — Other Ambulatory Visit: Payer: Self-pay

## 2017-06-29 MED ORDER — BUTALBITAL-ASPIRIN-CAFFEINE 50 MG-325 MG-40 MG CAPSULE
1.0000 | ORAL_CAPSULE | Freq: Four times a day (QID) | ORAL | 0 refills | Status: DC | PRN
  Filled 2017-06-29: qty 25, 7d supply, fill #0

## 2017-06-29 NOTE — Telephone Encounter (Signed)
Will need a follow-up appointment within the next 3 months/ prior to further refills.  [name], MD

## 2017-07-01 NOTE — Telephone Encounter (Signed)
Called patient no answer left voicemail to call back.  Operator: please relay MD's message and assist with scheduling. Hassan Blackshire, MA I

## 2017-07-07 NOTE — Telephone Encounter (Signed)
Scheduled. Jadee Golebiewski, MA I

## 2017-07-09 ENCOUNTER — Other Ambulatory Visit: Payer: Self-pay

## 2017-07-09 ENCOUNTER — Ambulatory Visit: Payer: Commercial Managed Care - HMO

## 2017-07-09 DIAGNOSIS — J301 Allergic rhinitis due to pollen: Secondary | ICD-10-CM

## 2017-07-09 NOTE — Nursing Note (Addendum)
Patient here for Immunotherapy.  Patient and antigen were identified with 2 identifiers, including patient's confirmation of correct antigen bottle(s). Per Allergy protocol the patient's condition was then assessed, which included any immediate or delayed reaction to previous dose, current health status, Peak flow reading if indicated, use of any new meds such as antibiotic or Beta Blocker, if antihistamine had been taken within the previous 24 hours, and length of time since last allergy injection(s).    The allergy injection(s) were then administered per the build up schedule.  Patients dose was repeated.(reduced, repeated, advanced).   Patient received in right arm:  0.50 mL of 1:1 Aq Tree/Grass/Weed subcutaneous.  Macio Kissoon C Laymon Stockert, RN    After 30 minutes observation, Patient's injection sites were checked for any local reaction. Patient then left the clinic feeling fine.  Local reaction was 0 (wheal/erythema).  Dayja Loveridge C Molli Gethers, RN

## 2017-07-13 ENCOUNTER — Other Ambulatory Visit: Payer: Self-pay

## 2017-07-13 ENCOUNTER — Other Ambulatory Visit: Payer: Self-pay | Admitting: Family Medicine

## 2017-07-13 MED ORDER — PROGESTERONE MICRONIZED 100 MG CAPSULE
100.0000 mg | ORAL_CAPSULE | Freq: Every day | ORAL | 0 refills | Status: DC
Start: 2017-07-13 — End: 2017-08-08
  Filled 2017-07-13: qty 30, 30d supply, fill #0

## 2017-07-14 ENCOUNTER — Ambulatory Visit: Payer: Commercial Managed Care - HMO | Attending: Family Medicine | Admitting: Family Medicine

## 2017-07-14 ENCOUNTER — Other Ambulatory Visit: Payer: Self-pay

## 2017-07-14 ENCOUNTER — Encounter: Payer: Self-pay | Admitting: Family Medicine

## 2017-07-14 ENCOUNTER — Ambulatory Visit (INDEPENDENT_AMBULATORY_CARE_PROVIDER_SITE_OTHER): Payer: Commercial Managed Care - HMO

## 2017-07-14 VITALS — BP 154/81 | HR 79 | Temp 97.3°F | Resp 16 | Ht 64.57 in | Wt 146.2 lb

## 2017-07-14 DIAGNOSIS — R945 Abnormal results of liver function studies: Principal | ICD-10-CM

## 2017-07-14 DIAGNOSIS — R7309 Other abnormal glucose: Secondary | ICD-10-CM

## 2017-07-14 DIAGNOSIS — I10 Essential (primary) hypertension: Secondary | ICD-10-CM

## 2017-07-14 DIAGNOSIS — R7989 Other specified abnormal findings of blood chemistry: Secondary | ICD-10-CM

## 2017-07-14 DIAGNOSIS — Z0000 Encounter for general adult medical examination without abnormal findings: Secondary | ICD-10-CM | POA: Insufficient documentation

## 2017-07-14 HISTORY — DX: Essential (primary) hypertension: I10

## 2017-07-14 LAB — HEPATIC FUNCTION PANEL
ALANINE TRANSFERASE (ALT): 60 U/L — AB (ref 5–54)
ALBUMIN: 4.4 g/dL (ref 3.2–4.6)
ALKALINE PHOSPHATASE (ALP): 76 U/L (ref 35–115)
Aspartate Transaminase (AST): 43 U/L (ref 15–43)
BILIRUBIN DIRECT: 0.1 mg/dL (ref 0.0–0.2)
BILIRUBIN TOTAL: 0.6 mg/dL (ref 0.3–1.3)
PROTEIN: 7.4 g/dL (ref 6.3–8.3)

## 2017-07-14 MED ORDER — METOPROLOL SUCCINATE ER 50 MG TABLET,EXTENDED RELEASE 24 HR
50.0000 mg | EXTENDED_RELEASE_TABLET | Freq: Every day | ORAL | 3 refills | Status: DC
  Filled 2017-07-14: qty 90, 90d supply, fill #0
  Filled 2017-10-12: qty 90, 90d supply, fill #1
  Filled 2018-01-09: qty 90, 90d supply, fill #2
  Filled 2018-04-18: qty 90, 90d supply, fill #3

## 2017-07-14 MED ORDER — METOPROLOL SUCCINATE ER 50 MG CAPSULE SPRINKLE, EXT. RELEASE 24 HR
50.0000 mg | EXTENDED_RELEASE_CAPSULE | Freq: Every day | ORAL | 3 refills | Status: DC
  Filled 2017-07-14: qty 90, 90d supply, fill #0

## 2017-07-14 NOTE — Patient Instructions (Signed)
To schedule your mammogram, call [phone].   Family Practice, [facility]  [facility]

## 2017-07-14 NOTE — Progress Notes (Signed)
07/14/2017     Chief Complaint   Patient presents with    Physical        SUBJECTIVE: Marisa Jenkins is a 62yr old female who presents today for a routine prevention physical.   Her last physical exam was 1 years ago.   In general she has been feeling well and functioning well at home, work, and personal relationships.    However, she would like to follow-up on elevated liver enzymes, as well as elevated glucose.  She has also had hypertension, and does not notice any symptoms associated with this, but blood pressure has been running high.  She has not been adhering to a DASH diet, and is starting to eat more healthy foods.  She has been working on weight reduction.      OB/GYN HISTORY: Menstrual history :  Patient's last menstrual period was 10/28/2010.  Denies any postmenopausal bleeding      Last pap smear test was 2017 and it was normal   Last mammogram was 1 year ago and it was normal.   Last colonoscopy was 2016 which showed a large pedunculated polyp.  She then did a sigmoidoscopy with biopsy in 2017.  And next colonoscopy is due 2020  Last labs were December 2018 which showed elevated cholesterol and elevated glucose.  Liver function tests have been mildly elevated in the past as well..     Vaccines are up to date.      reports that she has quit smoking. She has never used smokeless tobacco. She reports that she drinks alcohol. She reports that she does not use drugs.   She exercises regularly, at least 3-4 times a week on most weeks although recently has been less..    She is  performing periodic breast self examinations. She has not noted any breast problems.     ROS:   Review of Systems   Constitutional: Positive for activity change. Negative for appetite change, fatigue and unexpected weight change.   HENT: Positive for congestion and sneezing.    Respiratory: Negative.    Endocrine: Negative.    Genitourinary: Negative.    Musculoskeletal: Negative.    Allergic/Immunologic: Positive for environmental  allergies.   Neurological: Negative.           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    on holter monitor; some quadrigeminy    NAFLD (nonalcoholic fatty liver disease) 03/06/2017    seen on abdominal US    Pain in joint of right hand 2009    thumb joint    Postmenopausal HRT (hormone replacement therapy) 04/29/2012    Rectal cancer ([place]) 11/2014    found in rectal polyp, removed during colonoscopy    Right kidney stone 03/06/2017    seen on abdominal US, 5 mm nonobstructing    Sciatica 2006    Thrombocytopenia ([place]) 06/11/2006    Tubular adenoma 08/23/2014      Past Surgical History:   Procedure Laterality Date    BIOPSY, BREAST  2008  benign    BIOPSY, BREAST Left 10/2009    benign    COLONOSCOPY  11/24/2014    large pedunculated polyp    COLONOSCOPY  11/24/2014    COLONOSCOPY  06/22/2015    serrated tubular adenoma; no recurrence of rectal cancer hyperplastic polyp, repeat in 3 years    ESOPHAGOGASTRODUODENOSCOPY (EGD)  11/24/2014    small antral ulcerations    FLEXIBLE SIGMOIDOSCOPY  03/16/2015    HC UPPER GI ENDOSCOPY,BIOPSY  11/24/2014    PR COLSC FLX W/RMVL OF TUMOR POLYP LESION SNARE TQ  04/22/2011    tubular adenoma: recheck 05/2014    PR COLSC FLX W/RMVL OF TUMOR POLYP LESION SNARE TQ  05/18/06    tubular adenoma    PR LAP PROCEDURE, UNLISTED, BLADDER  2003    bladder repair    PR REMOVAL OF LEG VEINS/ULCER  02/2003    vein stripping  Dr. Vira Agar    PR SIGMOIDOSCOPY,BIOPSY  03/16/2015    TONSILLECTOMY      TONSILLECTOMY AND ADENOIDECTOMY  1962        ALLERGIES:   Allergies   Allergen  Reactions    Clindamycin Other-Reaction in Comments     heartburn    Penicillin G Hives     About 2010          Immunization History   Administered Date(s) Administered    Influenza Vaccine, Quadrivalent (Flulaval) 12/04/2015, 12/30/2016    Influenza Vaccine, Quadrivalent (Fluzone/Fluarix) 01/04/2013, 12/29/2013, 12/12/2014    Tdap (Boostrix) 03/11/2013, 12/28/2013    Zoster Vaccine (live) 07/19/2015    Zoster Vaccine (recombinant) 06/26/2016, 01/15/2017        OBJECTIVE:  BP 154/81 (SITE: right arm, Orthostatic Position: sitting, Cuff Size: large)   Pulse 79   Temp 36.3 C (97.3 F) (Temporal)   Resp 16   Ht 1.64 m (5' 4.57")   Wt 66.3 kg (146 lb 2.6 oz)   LMP 10/28/2010   BMI 24.65 kg/m    Patient's last menstrual period was 10/28/2010.      General Appearance: healthy, alert, no distress, pleasant affect, cooperative.  Eyes:  allergic shiners.  Ears:  normal TMs and canal and external inspection of ears show no abnormality.  Nose:  normal.  Mouth: post nasal drip.  Neck:  Neck supple. No adenopathy, thyroid symmetric, normal size.  Heart:  normal rate and regular rhythm, no murmurs, clicks, or gallops.  Lungs: clear to auscultation.  Breast:  negative.  Abdomen: BS normal.  Abdomen soft, non-tender.  No masses or organomegaly.  Extremities:  no cyanosis, clubbing, or edema.  Skin:  Skin color, texture, turgor normal. No rashes or lesions.  Pelvic:  External genitalia and vagina normal, bimanual and rectovaginal normal.  Rectal: negative.  Neuro: Gait normal. Reflexes normal and symmetric. Sensation and strength grossly normal.           ASSESSMENT:  (Z00.00) Routine general medical examination at a health care facility  (primary encounter diagnosis)  Comment:   Plan: DEXA, COMPLETE, BREAST MAMMOGRAM SCREENING            (R94.5) Elevated LFTs  Comment: Suspect due to fatty liver disease, which was seen on ultrasound.  Advised continue working on a low-fat low calorie diet and regular  exercise.  Plan: HEPATIC FUNCTION PANEL            (R73.09) Elevated glucose  Comment:   Plan: HEMOGLOBIN A1C        See above, ADA diet recommended    (  I10) Benign essential hypertension  Comment:   Plan: Metoprolol Succinate (TOPROL XL) 50 mg SR         Tablet                Prevention counseling: The patient was counseled regarding routine female prevention issues to include breast self examination, annual mammograms. She was also counseled regarding screening for hyperlipidemia, healthy diet and exercise, routine immunizations, sun protection and routine examinations. A Gynecologic Cancers pamphlet was  provided to the patient. Counselling in regards to timing for colon cancer screening and types of available screening discussed. Self care measures discussed include skin care, dental hygiene and cleanings, seatbelt and helmet use.     Advised annual follow up exam.    I reviewed the patient's past medical and family/social history, and updated problem list .  Barriers to Learning assessed: none. Patient verbalizes understanding of teaching and instructions.  [name], MD  Family Practice, [facility]

## 2017-07-14 NOTE — Nursing Note (Signed)
Vital signs taken,tobacco,allergies,pharmacy verified, screened for pain.  Vernette Moise, MA

## 2017-07-15 ENCOUNTER — Other Ambulatory Visit: Payer: Self-pay | Admitting: Family Medicine

## 2017-07-15 ENCOUNTER — Other Ambulatory Visit: Payer: Self-pay

## 2017-07-15 ENCOUNTER — Encounter: Payer: Self-pay | Admitting: Family Medicine

## 2017-07-15 DIAGNOSIS — E7800 Pure hypercholesterolemia, unspecified: Principal | ICD-10-CM

## 2017-07-15 LAB — HEMOGLOBIN A1C
HGB A1C,GLUCOSE EST AVG: 117 mg/dL
HGB A1C: 5.7 % — AB (ref 3.9–5.6)

## 2017-07-15 MED ORDER — ATORVASTATIN 20 MG TABLET
20.0000 mg | ORAL_TABLET | Freq: Every day | ORAL | 3 refills | Status: DC
  Filled 2017-07-15: qty 90, 90d supply, fill #0
  Filled 2017-11-02: qty 90, 90d supply, fill #1
  Filled 2018-02-08: qty 90, 90d supply, fill #2
  Filled 2018-05-13: qty 90, 90d supply, fill #3

## 2017-07-22 ENCOUNTER — Ambulatory Visit: Payer: Commercial Managed Care - HMO

## 2017-07-22 DIAGNOSIS — J301 Allergic rhinitis due to pollen: Secondary | ICD-10-CM

## 2017-07-22 NOTE — Nursing Note (Signed)
Patient here for Immunotherapy.  Patient and antigen were identified with 2 identifiers, including patient's confirmation of correct antigen bottle(s). Per Allergy protocol the patient's condition was then assessed, which included any immediate or delayed reaction to previous dose, current health status, Peak flow reading if indicated, use of any new meds such as antibiotic or Beta Blocker, if antihistamine had been taken within the previous 24 hours, and length of time since last allergy injection(s).    The allergy injection(s) were then administered per the build up schedule.  Patients dose was repeated.(reduced, repeated, advanced).   Patient received in left arm:  0.50 mL of 1:1 Aq Tree/Grass/Weed subcutaneous.  Andretta Ergle C Edlyn Rosenburg, RN    After 30 minutes observation, Patient's injection sites were checked for any local reaction. Patient then left the clinic feeling fine.  Local reaction was 0 (wheal/erythema).  Crixus Mcaulay C Kethan Papadopoulos, RN

## 2017-07-30 ENCOUNTER — Other Ambulatory Visit: Payer: Self-pay

## 2017-07-31 ENCOUNTER — Other Ambulatory Visit: Payer: Self-pay

## 2017-08-04 ENCOUNTER — Other Ambulatory Visit: Payer: Self-pay

## 2017-08-05 ENCOUNTER — Other Ambulatory Visit: Payer: Self-pay

## 2017-08-05 ENCOUNTER — Encounter: Payer: Self-pay | Admitting: Family Medicine

## 2017-08-05 MED ORDER — DIAZEPAM 5 MG TABLET
5.0000 mg | ORAL_TABLET | Freq: Four times a day (QID) | ORAL | 0 refills | Status: DC | PRN
  Filled 2017-08-05: qty 30, 8d supply, fill #0

## 2017-08-05 NOTE — Telephone Encounter (Signed)
Yes she takes it infrequently for panic attacks.  Prescription refill sent. [name], MD

## 2017-08-05 NOTE — Telephone Encounter (Signed)
appears discontinued. Marisa Jenkins, [place] I

## 2017-08-07 ENCOUNTER — Ambulatory Visit
Admission: RE | Admit: 2017-08-07 | Discharge: 2017-08-07 | Disposition: A | Discharge status: Home / Self Care | Payer: Commercial Managed Care - HMO | Source: Ambulatory Visit | Attending: Nuclear Medicine | Admitting: Nuclear Medicine

## 2017-08-07 DIAGNOSIS — Z0000 Encounter for general adult medical examination without abnormal findings: Secondary | ICD-10-CM

## 2017-08-07 DIAGNOSIS — Z780 Asymptomatic menopausal state: Secondary | ICD-10-CM | POA: Insufficient documentation

## 2017-08-07 DIAGNOSIS — Z13820 Encounter for screening for osteoporosis: Secondary | ICD-10-CM | POA: Insufficient documentation

## 2017-08-07 DIAGNOSIS — M8588 Other specified disorders of bone density and structure, other site: Principal | ICD-10-CM | POA: Insufficient documentation

## 2017-08-08 ENCOUNTER — Other Ambulatory Visit: Payer: Self-pay | Admitting: Family Medicine

## 2017-08-10 ENCOUNTER — Other Ambulatory Visit: Payer: Self-pay

## 2017-08-10 MED ORDER — PROGESTERONE MICRONIZED 100 MG CAPSULE
100.0000 mg | ORAL_CAPSULE | Freq: Every day | ORAL | 2 refills | Status: DC
Start: 2017-08-10 — End: 2017-12-15
  Filled 2017-08-10: qty 90, 90d supply, fill #0
  Filled 2017-11-02: qty 90, 90d supply, fill #1

## 2017-08-14 ENCOUNTER — Ambulatory Visit: Payer: Commercial Managed Care - HMO | Admitting: Family Medicine

## 2017-08-18 ENCOUNTER — Encounter: Payer: Self-pay | Admitting: Family Medicine

## 2017-08-18 ENCOUNTER — Ambulatory Visit: Payer: Commercial Managed Care - HMO | Admitting: Family Medicine

## 2017-08-18 ENCOUNTER — Ambulatory Visit: Payer: Commercial Managed Care - HMO

## 2017-08-18 VITALS — BP 124/65 | HR 73 | Temp 97.0°F | Wt 145.5 lb

## 2017-08-18 DIAGNOSIS — R7303 Prediabetes: Principal | ICD-10-CM

## 2017-08-18 DIAGNOSIS — I10 Essential (primary) hypertension: Secondary | ICD-10-CM

## 2017-08-18 DIAGNOSIS — J301 Allergic rhinitis due to pollen: Secondary | ICD-10-CM

## 2017-08-18 DIAGNOSIS — M8588 Other specified disorders of bone density and structure, other site: Secondary | ICD-10-CM

## 2017-08-18 DIAGNOSIS — E559 Vitamin D deficiency, unspecified: Secondary | ICD-10-CM

## 2017-08-18 DIAGNOSIS — K760 Fatty (change of) liver, not elsewhere classified: Secondary | ICD-10-CM

## 2017-08-18 NOTE — Patient Instructions (Signed)
Learning About Osteopenia  What is osteopenia?  Osteopenia is a decrease in thickness, or density, in bones. That means the bones become thinner and weaker. It is much more common in women than in men. It is an early form of osteoporosis, a condition in which the bones are so thin and weak that they can break easily.  It's important to know that osteopenia is not a disease. It can happen normally with aging. Having osteopenia means that there is a greater risk that you may get osteoporosis.It also means that you are more likely to break a bone than someone who does not have osteopenia. But not everyone with osteopenia gets osteoporosis or breaks a bone.  Osteopenia doesn't cause any symptoms. It's usually found with a type of X-ray called a bone density test. Osteopenia means that your bone density result (T-score) is between -1.0 and -2.5.  What increases the risk for osteopenia?  Things that increase your risk include:   Having a family history of osteoporosis.   Being thin.   Being white or Asian.   Getting too little physical activity.   Smoking.   Drinking too much alcohol often.   Using certain medicines such as steroids.  How can you prevent osteoporosis?  There are things you can do to slow down osteopenia and prevent osteoporosis. Certain lifestyle changes will help slow the loss of bone density.   Eat food that has plenty of calcium and vitamin D. Yogurt, cheese, milk, and dark green vegetables are high in calcium. Eggs, fatty fish, cereal, and fortified milk are high in vitamin D.   Talk to your doctor about taking a calcium supplement that has vitamin D in it.   Get regular exercise.   Do 30 minutes of weight-bearing exercise on most days of the week. Walking, jogging, stair climbing, and dancing are good choices.   Do resistance exercises with weights or elastic bands 2 or 3 days a week.   Limit alcohol to 2 drinks a day for men and 1 drink a day for women. Too much alcohol can cause  health problems.   Do not smoke. Smoking can make bones thin faster. If you need help quitting, talk to your doctor about stop-smoking programs and medicines. These can increase your chances of quitting for good.  Prescription medicines are available for treating bone thinning. But these are more often used to treat osteoporosis.  Follow-up care is a key part of your treatment and safety. Be sure to make and go to all appointments, and call your doctor if you are having problems. It's also a good idea to know your test results and keep a list of the medicines you take.  Where can you learn more?  Go to https://www.healthwise.net/patientEd.  Enter M898 in the search box to learn more about "Learning About Osteopenia."  Current as of: Jul 20, 2015  Content Version: 11.4   2006-2017 [employer]. Care instructions adapted under license by your healthcare professional. If you have questions about a medical condition or this instruction, always ask your healthcare professional. Healthwise, Incorporated disclaims any warranty or liability for your use of this information.

## 2017-08-18 NOTE — Nursing Note (Signed)
Vital signs taken,tobacco,allergies,pharmacy verified, screened for pain.  Melda Mermelstein, MA

## 2017-08-18 NOTE — Nursing Note (Signed)
Patient here for Immunotherapy.  Patient and antigen were identified with 2 identifiers, including patient's confirmation of correct antigen bottle(s). Per Allergy protocol the patient's condition was then assessed, which included any immediate or delayed reaction to previous dose, current health status, Peak flow reading if indicated, use of any new meds such as antibiotic or Beta Blocker, if antihistamine had been taken within the previous 24 hours, and length of time since last allergy injection(s).    The allergy injection(s) were then administered per the build up schedule.  Patients dose was repeated.(reduced, repeated, advanced).   Patient received in right arm:  0.50 mL of 1:1 Aq Tree/Grass/Weed subcutaneous.  Bridgette Wolden C Laderrick Wilk, RN    After 30 minutes observation, Patient's injection sites were checked for any local reaction. Patient then left the clinic feeling fine.  Local reaction was 0 (wheal/erythema).  Jamesen Stahnke C Jossalin Chervenak, RN

## 2017-08-18 NOTE — Progress Notes (Signed)
08/18/2017   EPIC FAMILY MEDICINE VISIT: MRN# [id number]  Name: Marisa Jenkins  Date of birth: 02/01/1956  Primary care physician is [name], MD     Chief Complaint   Patient presents with    Test Results         SUBJECTIVE:  Marisa Jenkins is a 62yr old female who is here for follow-up on blood test results, which show mildly elevated glucose, consistent with prediabetes, and elevated liver enzymes consistent with fatty liver disease, since she did do an ultrasound as well.  She is continuing to drink alcohol, and has been advised to stop drinking all alcohol.    She has vitamin D deficiency in the past, and has been taking some vitamin D intermittently, but plans to start taking it daily.  Lastly, blood pressure has been higher, but recently started on hydrochlorothiazide which does seem to be helping and she denies any significant side effects.  Does urinate a little more frequently right after taking the pill.  Denies any chest pain, edema, or constipation problems.  Tends to have loose bowel movements.          Patient Active Problem List   Diagnosis    Hyperlipidemia    Postmenopausal HRT (hormone replacement therapy)    Migraine headache    Allergic rhinitis    Anxiety disorder    Impaired fasting glucose    Pain in joint of right hand    Internal hemorrhoids    Seasonal allergic rhinitis due to pollen    Cerumen impaction    Injury of right foot    Libido, decreased    Pain, abdominal    Tubular adenoma    Polyp of colon    Elevated liver enzymes    Rectal cancer (HCC)    Multifocal PVCs with pairing    Allergic conjunctivitis of both eyes    Dysfunction of left eustachian tube    Palpitations    Seborrheic keratosis, inflamed    NAFLD (nonalcoholic fatty liver disease)    Right kidney stone    Benign essential hypertension    Elevated glucose    Elevated LFTs       REVIEW of SYSTEMS :  Review of Systems   Constitutional: Positive for activity change and fatigue.   HENT:  Positive for congestion.    Eyes: Negative.    Respiratory: Negative.    Cardiovascular: Negative for chest pain.   Gastrointestinal: Positive for abdominal distention. Negative for abdominal pain, blood in stool and constipation.   Genitourinary: Negative.    Allergic/Immunologic: Positive for environmental allergies.   Neurological: Positive for headaches.   Psychiatric/Behavioral: Positive for dysphoric mood.        OBJECTIVE:  Vital signs:    BP 124/65 (SITE: left arm, Orthostatic Position: sitting, Cuff Size: regular)   Pulse 73   Temp 36.1 C (97 F) (Temporal)   Wt 66 kg (145 lb 8.1 oz)   LMP 10/28/2010   BMI 24.54 kg/m    General Appearance: healthy, alert, no distress, pleasant affect, cooperative.  Eyes:  conjunctivae and corneas clear. PERRL, EOM's intact. sclerae normal.  Ears:  normal TMs and canal and external inspection of ears show no abnormality.  Nose: Mild nasal congestion, clear rhinorrhea  Mouth: post nasal drip.  Neck:  Neck supple. No adenopathy, thyroid symmetric, normal size.  Heart:  normal rate and regular rhythm, no murmurs, clicks, or gallops.  Lungs: clear to auscultation.  Abdomen: BS normal.  Abdomen soft, non-tender.  No masses or organomegaly.  Extremities:  no cyanosis, clubbing, or edema and distal pulses normal.  Neuro: Gait normal. Reflexes normal and symmetric. Sensation and strength grossly normal.  Mental Status: Mildly dysphoric, she is worried about her sugar being elevated, and what to do for that  Musculoskeletal: back:negative findings: no evidence of scoliosis, no skin lesions, erythema, or scars, no tenderness to percussion or palpation, positive findings: dorsal kyphosis of mild degree, paraspinal muscle spasm, loss of lumbar lordosis        ASSESSMENT:  (R73.03) Prediabetes  (primary encounter diagnosis)  Comment: Reviewed treatment options, advised to take metformin medication, avoid sweets, exercise regularly, and eat a high-fiber diet.  He indicated  understanding, and declined referral to nutritionist.  Follow-up A1c in 6 months    (K76.0) Fatty liver  Comment: Advised to avoid all alcohol, fatty foods, and to continue to work on reducing weight.    (M85.88) Osteopenia of lumbar spine  Comment: Has had vitamin D deficiency, intermittent low back pain  Plan: Starting on vitamin D supplement, 1000 international units daily and will recheck level since she also has a history of kidney stone.    (E55.9) Vitamin D deficiency  Comment:   Plan: VITAMIN D, 25 HYDROXY            (I10) Benign essential hypertension  Comment:   Plan: Blood pressure is now under good control, will continue with the low dose of hydrochlorothiazide, and advised that it may actually make sugars go up higher, so will keep close tabs on this.    Return to clinic: 3 to 6 months  Advised to call back if symptoms fail to improve as expected or worsen.     Total visit time: 25 minutes, of which  50 % was spent face-to-face counseling about the following diagnoses/ health problems:    1. Prediabetes    2. Fatty liver    3. Osteopenia of lumbar spine    4. Vitamin D deficiency    5. Benign essential hypertension       Barriers to Learning assessed: none. Patient verbalizes understanding of teaching and instructions and agrees with the plan of care.    I reviewed the patient's past medical and family/social history, and updated problem list and past medical history .  Barriers to Learning assessed: none. Patient verbalizes understanding of teaching and instructions.  [name], MD  Family Practice, [facility]  [facility]     This document has been created using Dragon Naturally Speaking and has been checked for content. Some voice recognition errors may have been missed inadvertantly.  Electronically signed by:  [name], MD  Family Practice Department/ [facility]

## 2017-08-19 ENCOUNTER — Encounter: Payer: Self-pay | Admitting: Family Medicine

## 2017-08-19 DIAGNOSIS — E559 Vitamin D deficiency, unspecified: Secondary | ICD-10-CM | POA: Insufficient documentation

## 2017-08-19 DIAGNOSIS — M8588 Other specified disorders of bone density and structure, other site: Secondary | ICD-10-CM | POA: Insufficient documentation

## 2017-08-19 DIAGNOSIS — R7303 Prediabetes: Secondary | ICD-10-CM | POA: Insufficient documentation

## 2017-08-19 HISTORY — DX: Prediabetes: R73.03

## 2017-08-25 ENCOUNTER — Ambulatory Visit
Admission: RE | Admit: 2017-08-25 | Discharge: 2017-08-25 | Disposition: A | Discharge status: Home / Self Care | Payer: Commercial Managed Care - HMO | Source: Ambulatory Visit | Attending: Radiologic Technologist | Admitting: Radiologic Technologist

## 2017-08-25 DIAGNOSIS — Z1231 Encounter for screening mammogram for malignant neoplasm of breast: Secondary | ICD-10-CM

## 2017-08-25 DIAGNOSIS — Z0000 Encounter for general adult medical examination without abnormal findings: Principal | ICD-10-CM | POA: Insufficient documentation

## 2017-09-07 ENCOUNTER — Ambulatory Visit: Payer: Commercial Managed Care - HMO

## 2017-09-07 DIAGNOSIS — J301 Allergic rhinitis due to pollen: Secondary | ICD-10-CM

## 2017-09-07 NOTE — Nursing Note (Signed)
Patient here for Immunotherapy.  Patient and antigen were identified with 2 identifiers, including patient's confirmation of correct antigen bottle(s). Per Allergy protocol the patient's condition was then assessed, which included any immediate or delayed reaction to previous dose, current health status, Peak flow reading if indicated, use of any new meds such as antibiotic or Beta Blocker, if antihistamine had been taken within the previous 24 hours, and length of time since last allergy injection(s).    The allergy injection(s) were then administered per the build up schedule.  Patients dose was repeated.(reduced, repeated, advanced).   Patient received in left arm:  0.50 mL of 1:1 Aq Tree/Grass/Weed subcutaneous.  Melinda Gwinner C Griff Badley, RN    After 30 minutes observation, Patient's injection sites were checked for any local reaction. Patient then left the clinic feeling fine.  Local reaction was 0 (wheal/erythema).  Eri Mcevers C Jorey Dollard, RN

## 2017-10-13 ENCOUNTER — Other Ambulatory Visit: Payer: Self-pay

## 2017-11-02 ENCOUNTER — Other Ambulatory Visit: Payer: Self-pay

## 2017-11-10 ENCOUNTER — Other Ambulatory Visit: Payer: Self-pay

## 2017-11-10 ENCOUNTER — Other Ambulatory Visit: Payer: Self-pay | Admitting: Family Medicine

## 2017-11-11 NOTE — Telephone Encounter (Signed)
Last refill date: 06/29/17    Last Office Visit: 08/18/17    Surescripts request.

## 2017-11-12 ENCOUNTER — Other Ambulatory Visit: Payer: Self-pay

## 2017-11-12 MED ORDER — BUTALBITAL-ASPIRIN-CAFFEINE 50 MG-325 MG-40 MG CAPSULE
1.0000 | ORAL_CAPSULE | Freq: Four times a day (QID) | ORAL | 0 refills | Status: DC | PRN
  Filled 2017-11-12: qty 25, 7d supply, fill #0

## 2017-12-15 ENCOUNTER — Ambulatory Visit: Payer: Commercial Managed Care - HMO | Admitting: Family Medicine

## 2017-12-15 ENCOUNTER — Encounter: Payer: Self-pay | Admitting: Family Medicine

## 2017-12-15 VITALS — BP 129/73 | HR 67 | Resp 16 | Wt 140.2 lb

## 2017-12-15 DIAGNOSIS — M21611 Bunion of right foot: Secondary | ICD-10-CM

## 2017-12-15 DIAGNOSIS — R7309 Other abnormal glucose: Secondary | ICD-10-CM

## 2017-12-15 DIAGNOSIS — Z130 Encounter for screening for diseases of the blood and blood-forming organs and certain disorders involving the immune mechanism: Secondary | ICD-10-CM

## 2017-12-15 DIAGNOSIS — E785 Hyperlipidemia, unspecified: Secondary | ICD-10-CM

## 2017-12-15 DIAGNOSIS — Z79890 Hormone replacement therapy (postmenopausal): Secondary | ICD-10-CM

## 2017-12-15 DIAGNOSIS — M21619 Bunion of unspecified foot: Principal | ICD-10-CM

## 2017-12-15 DIAGNOSIS — Z23 Encounter for immunization: Secondary | ICD-10-CM

## 2017-12-15 NOTE — Nursing Note (Signed)
Vital signs taken,tobacco,allergies,pharmacy verified, screened for pain.  Angeles Paolucci, MA

## 2017-12-15 NOTE — Progress Notes (Signed)
12/15/2017   EPIC FAMILY MEDICINE VISIT: MRN# [id number]  Name: Marisa Jenkins  Date of birth: Apr 18, 1955  Primary care physician is [name], MD     Chief Complaint   Patient presents with    Medication Follow Up    Bunions         SUBJECTIVE:  Marisa Jenkins is a 62yr old female who is considering stopping the hormone replacement therapy since she is on the lower strength of the Vivelle patch and not having any hot flashes or symptoms other than vaginal dryness.  She is using over-the-counter lubricants which is working well enough for that.  She is considering stopping the medication Vivelle, and Provera/progesterone tablets, and would like to advice about what she can do instead.  Overall has been feeling well though, not waking up at night though occasionally gets some light sweats.    Also has a bunion on the right foot which has become tender, swollen and painful, and left foot has not been bothering her.  She is asking for a referral to a specialist for this, since it is starting to become limiting in terms of her exercise and ability to go and do things.    She is also had elevated blood sugars, and is working on getting her sugars down with her diet though has not really been able to get it down to normal.  She would like to check on her blood test to see how her sugars are doing as well as cholesterol.      Patient Active Problem List   Diagnosis    Hyperlipidemia    Postmenopausal HRT (hormone replacement therapy)    Migraine headache    Allergic rhinitis    Anxiety disorder    Impaired fasting glucose    Pain in joint of right hand    Internal hemorrhoids    Seasonal allergic rhinitis due to pollen    Cerumen impaction    Injury of right foot    Libido, decreased    Pain, abdominal    Tubular adenoma    Polyp of colon    Elevated liver enzymes    Rectal cancer (HCC)    Multifocal PVCs with pairing    Allergic conjunctivitis of both eyes    Dysfunction of left eustachian tube     Palpitations    Seborrheic keratosis, inflamed    Right kidney stone    Benign essential hypertension    Elevated glucose    Prediabetes    Osteopenia of lumbar spine    Vitamin D deficiency       REVIEW of SYSTEMS :  Review of Systems   Constitutional: Positive for activity change.   HENT: Negative.    Respiratory: Negative for shortness of breath.    Cardiovascular: Negative.  Negative for chest pain.   Gastrointestinal: Negative.    Endocrine: Negative for heat intolerance.   Genitourinary:        Vaginal dryness   Musculoskeletal: Positive for arthralgias.   Skin: Negative.    Neurological: Negative.    Hematological: Negative.    Psychiatric/Behavioral: The patient is nervous/anxious.         Denies any hot flashes waking her up from sleep          OBJECTIVE:  Vital signs:    BP 129/73 (SITE: left arm, Orthostatic Position: sitting, Cuff Size: regular)   Pulse 67   Resp 16   Wt 63.6 kg (140 lb 3.4 oz)  LMP 10/28/2010   BMI 23.65 kg/m    General Appearance: healthy, alert, no distress, pleasant affect, cooperative.  Mouth: normal.  Neck:  Neck supple. No adenopathy, thyroid symmetric, normal size.  Heart:  normal rate and regular rhythm, no murmurs, clicks, or gallops.  Lungs: clear to auscultation.  Abdomen: BS normal.  Abdomen soft, non-tender.  No masses or organomegaly.  Extremities:  no cyanosis, clubbing, or edema and tender bunion on the right metatarsal phalangeal joint area medially.  Mental Status: Euthymic       ASSESSMENT:  (M21.619) Bunion, right big toe  (primary encounter diagnosis)  Comment: Avoid tight fitting shoes, consider using a shoe stretcher podiatry referral sent, though she is considering not doing any procedure still  Plan: PODIATRY REFERRAL        Hormone replacement therapy: Will stop both the Vivelle patch and progesterone, reviewed alternative measures including flaxseed ground up, and could try [employer].    (Z23) Influenza vaccine needed  Comment:   Plan:  INFLUENZA VACCINE (45 MONTH OLD AND OLDER),         QUADRIVALENT, NO LATEX, NO PRESERVATIVE  Elevated blood sugar, advised low sugar diet, and has had elevated cholesterol, so is working on that as well.  Will check lab tests        Return to clinic: PRN  Advised to call back if symptoms fail to improve as expected or worsen.     Total visit time: 23 minutes, of which  40 % was spent face-to-face counseling about the following diagnoses/ health problems:  1. Bunion, right big toe    2. Influenza vaccine needed    3. Elevated glucose    4. Screening, anemia, deficiency, iron    5. Hyperlipidemia with target LDL less than 130    6. Postmenopausal HRT (hormone replacement therapy)        Barriers to Learning assessed: none.  Patient verbalizes understanding of teaching and instructions and agrees with the plan of care.    I reviewed the patient's past medical and family/social history, and updated problem list and past medical history .  Barriers to Learning assessed: none. Patient verbalizes understanding of teaching and instructions.  [name], MD  Family Practice, [facility]  [facility]     This document has been created using Dragon Naturally Speaking and has been checked for content. Some voice recognition errors may have been missed inadvertantly.Please note any grammatical, sound-alike, or syntax errors as likely dictation errors.   Electronically signed by:  [name], MD  Family Practice Department/ [facility]

## 2017-12-15 NOTE — Patient Instructions (Signed)
I have ordered your 12 hour fasting blood tests.  You can drop in to the downstairs lab any time between 7:30 am and 4pm, except closed for lunch 12:30-1PM. (a lab appointment is not needed).  Letita Prentiss, MD

## 2017-12-15 NOTE — Nursing Note (Signed)
The Influenza Vaccine VIS document for the flu injection was given to patient to review. Patient or person named in permission has answered no to any history of egg allergy, previous serious reaction to a influenza vaccine or current illness which would preclude them receiving an immunization. Any questions were referred to the physician. The Influenza Vaccine was then administered per protocol or by Policy 2041 using the standing order from Dr. James Kirk (Academic) or Dr. Kurt Slapnik (Network).   The patient was observed for immediate reactions to the vaccine per protocol. None were observed.   Fiona Coto, MA

## 2018-01-11 ENCOUNTER — Other Ambulatory Visit: Payer: Self-pay

## 2018-02-02 ENCOUNTER — Other Ambulatory Visit: Payer: Self-pay | Admitting: Family Medicine

## 2018-02-02 ENCOUNTER — Other Ambulatory Visit: Payer: Self-pay

## 2018-02-02 DIAGNOSIS — K279 Peptic ulcer, site unspecified, unspecified as acute or chronic, without hemorrhage or perforation: Principal | ICD-10-CM

## 2018-02-03 ENCOUNTER — Other Ambulatory Visit: Payer: Self-pay

## 2018-02-03 MED ORDER — FAMOTIDINE 40 MG TABLET
40.0000 mg | ORAL_TABLET | Freq: Every day | ORAL | 3 refills | Status: DC
Start: 2018-02-03 — End: 2019-04-27
  Filled 2018-02-03: qty 90, 90d supply, fill #0
  Filled 2018-05-13: qty 90, 90d supply, fill #1
  Filled 2018-08-24: qty 90, 90d supply, fill #2

## 2018-02-08 ENCOUNTER — Encounter: Payer: Self-pay | Admitting: Family Medicine

## 2018-02-08 ENCOUNTER — Other Ambulatory Visit: Payer: Self-pay

## 2018-02-08 NOTE — Telephone Encounter (Signed)
From: [name]  To: [name], MD  Sent: 02/08/2018 12:42 PM PST  Subject: MyChart Refill Request    I would like to request a re-fill for diazepam please

## 2018-02-09 MED ORDER — DIAZEPAM 5 MG TABLET
5.0000 mg | ORAL_TABLET | Freq: Every day | ORAL | 0 refills | Status: DC | PRN
  Filled 2018-02-09: qty 30, 30d supply, fill #0

## 2018-02-10 ENCOUNTER — Other Ambulatory Visit: Payer: Self-pay

## 2018-03-12 ENCOUNTER — Telehealth: Payer: Self-pay | Admitting: Family Medicine

## 2018-03-12 ENCOUNTER — Other Ambulatory Visit: Payer: Self-pay

## 2018-03-12 MED ORDER — CLARITHROMYCIN 500 MG TABLET
500.0000 mg | ORAL_TABLET | Freq: Two times a day (BID) | ORAL | 0 refills | Status: DC
Start: 2018-03-12 — End: 2018-05-27
  Filled 2018-03-12: qty 20, 10d supply, fill #0

## 2018-03-12 NOTE — Telephone Encounter (Signed)
3 patient identifiers used.  Per:  Patient.      ClearTriage Note: The patient reported that she has sinus congestion with yellow discharge, head congestion, face pain, teeth hurt, feels feverish, taking alka seltzer cold capsules, sudafed, and was calling for advice. Per Clear Triage Sinus Pain or Congestion protocol home care, call-back and emergency information was reviewed with the caller.      Protocol Used: Sinus Pain or Congestion (Adult)  Protocol-Based Disposition: See HCP within 4 Hours (or PCP triage)    Positive Triage Questions:  * [1] SEVERE pain AND [2] not improved 2 hours after pain medicine  * [1] Redness or swelling on the cheek, forehead or around the eye AND [2] no fever    Care Advice Discussed:  * Apply Local Cold      - For sinus pain unresponsive to pain medicine and nasal washes, apply a cold pack or ice in a wet washcloth for 20 minutes four times a day as needed.  * Caution - NSAIDs (e.g., ibuprofen, naproxen)      - Do not take nonsteroidal anti-inflammatory drugs (NSAIDs) if you have stomach problems, kidney disease, heart failure, or other contraindications to using this type of medicine.      - Do not take NSAID medicines for over 7 days without consulting your PCP.      - Do not take NSAID medicines if you are pregnant.      - Do not take NSAID medicines if you are also taking blood thinners.      - You may take this medicine with or without food. Taking it with food or milk may lessen the chance the drug will upset your stomach.      - Gastrointestinal Risk: There is an increased risk of stomach ulcers, GI bleeding, perforation.      - Cardiovascular Risk: There may be an increased risk of heart attack and stroke.  * Pain Medicines      - For pain relief, take acetaminophen, ibuprofen, or naproxen.      - Use the lowest amount that makes your pain feel better.      - Acetaminophen (e.g., Tylenol): Take 650 mg (two 325 mg pills) by mouth every 4-6 hours as needed. Each Regular  Strength Tylenol pill has 325 mg of acetaminophen. The most you should take each day is 3,250 mg (10 Regular Strength pills a day).      - Another choice is to take 1,000 mg (two 500 mg pills) every 8 hours as needed. Each Extra Strength Tylenol pill has 500 mg of acetaminophen. The most you should take each day is 3,000 mg (6 Extra Strength pills a day).      - Ibuprofen (e.g., Motrin, Advil): Take 400 mg (two 200 mg pills) by mouth every 6 hours as needed. Another choice is to take 600 mg (three 200 mg pills) by mouth every 8 hours as needed. The most you should take each day is 1,200 mg (six 200 mg pills a day), unless your doctor has told you to take more.      - Naproxen (e.g., Aleve): Take 220 mg (one 220 mg pill) by mouth every 8 hours as needed. You may take 440 mg (two 220 mg pills) for your first dose. The most you should take each day is 660 mg (three 220 mg pills a day), unless your doctor has told you to take more.  * Reasons To Call Back      -  You become worse.    Negative Triage Questions:  * Severe difficulty breathing (e.g., struggling for each breath, speaks in single words)  * Sounds like a life-threatening emergency to the triager  * [1] Difficulty breathing AND [2] not from stuffy nose (e.g., not relieved by cleaning out the nose)  * [1] SEVERE headache AND [2] fever  * [1] Redness or swelling on the cheek, forehead or around the eye AND [2] fever  * Fever > 104 F (40 C)  * Patient sounds very sick or weak to the triager  Disposition: Referred to [facility] per Sinus Pain and Congestion protocol  Per:   patient verbalizes agreement to plan. Agrees to callback with any increase in symptoms/concerns or questions.     [place]  [facility] Triage Nurse

## 2018-04-18 ENCOUNTER — Other Ambulatory Visit: Payer: Self-pay | Admitting: Family Medicine

## 2018-04-19 ENCOUNTER — Other Ambulatory Visit: Payer: Self-pay

## 2018-04-21 ENCOUNTER — Other Ambulatory Visit: Payer: Self-pay

## 2018-04-21 MED ORDER — BUTALBITAL-ASPIRIN-CAFFEINE 50 MG-325 MG-40 MG CAPSULE
1.0000 | ORAL_CAPSULE | Freq: Four times a day (QID) | ORAL | 0 refills | Status: DC | PRN
  Filled 2018-04-21: qty 25, 7d supply, fill #0

## 2018-05-03 ENCOUNTER — Other Ambulatory Visit: Payer: Self-pay

## 2018-05-05 ENCOUNTER — Encounter: Payer: Self-pay | Admitting: Family Medicine

## 2018-05-05 NOTE — Telephone Encounter (Signed)
From: [name]  To: [name], MD  Sent: 05/05/2018 11:44 AM PST  Subject: MyChart Refill Request    I would like to request a re-fill of Diazepam 5 mg. Generic for Valium

## 2018-05-11 ENCOUNTER — Ambulatory Visit: Payer: Commercial Managed Care - HMO | Attending: Family Medicine

## 2018-05-11 DIAGNOSIS — Z130 Encounter for screening for diseases of the blood and blood-forming organs and certain disorders involving the immune mechanism: Principal | ICD-10-CM | POA: Insufficient documentation

## 2018-05-11 DIAGNOSIS — R7309 Other abnormal glucose: Secondary | ICD-10-CM | POA: Insufficient documentation

## 2018-05-11 DIAGNOSIS — E559 Vitamin D deficiency, unspecified: Secondary | ICD-10-CM | POA: Insufficient documentation

## 2018-05-11 DIAGNOSIS — E785 Hyperlipidemia, unspecified: Secondary | ICD-10-CM | POA: Insufficient documentation

## 2018-05-11 LAB — CBC NO DIFFERENTIAL
HEMATOCRIT: 41.1 % (ref 36.0–46.0)
HEMOGLOBIN: 13.7 g/dL (ref 12.0–16.0)
MCH: 33 pg (ref 27.0–33.0)
MCHC: 33.4 % (ref 32.0–36.0)
MCV: 98.7 fL (ref 80.0–100.0)
MPV: 8.1 fL (ref 6.8–10.0)
PLATELET COUNT: 223 10*3/uL (ref 130–400)
RDW: 12.6 % (ref 0.0–14.7)
Red Blood Cell Count: 4.16 10*6/uL (ref 4.00–5.20)
WHITE BLOOD CELL COUNT: 4.9 10*3/uL (ref 4.5–11.0)

## 2018-05-11 LAB — COMPREHENSIVE METABOLIC PANEL
ALANINE TRANSFERASE (ALT): 29 U/L (ref 5–54)
ALBUMIN: 4.6 g/dL (ref 3.2–4.6)
ALKALINE PHOSPHATASE (ALP): 73 U/L (ref 35–115)
Aspartate Transaminase (AST): 26 U/L (ref 15–43)
BILIRUBIN TOTAL: 0.5 mg/dL (ref 0.3–1.3)
CALCIUM: 9.8 mg/dL (ref 8.6–10.5)
CARBON DIOXIDE TOTAL: 26 mmol/L (ref 24–32)
CHLORIDE: 103 mmol/L (ref 95–110)
Creatinine Serum: 0.64 mg/dL (ref 0.44–1.27)
E-GFR, NON-AFRICAN AMERICAN (FEMALE): 96 mL/min/{1.73_m2}
GLUCOSE: 122 mg/dL — AB (ref 70–99)
POTASSIUM: 4.5 mmol/L (ref 3.3–5.0)
PROTEIN: 7.7 g/dL (ref 6.3–8.3)
SODIUM: 138 mmol/L (ref 135–145)
UREA NITROGEN, BLOOD (BUN): 20 mg/dL (ref 8–22)

## 2018-05-11 LAB — VITAMIN D, 25 HYDROXY: VITAMIN D, 25 HYDROXY: 23.9 ng/mL (ref 20.0–50.0)

## 2018-05-11 LAB — LIPID PANEL WITH DLDL REFLEX
CHOLESTEROL: 225 mg/dL — AB (ref 0–200)
HDL CHOLESTEROL: 57 mg/dL (ref >=35)
LDL CHOLESTEROL CALCULATION: 140 mg/dL — AB (ref <130)
NON-HDL CHOLESTEROL: 168 mg/dL — AB (ref <=150)
TOTAL CHOLESTEROL:HDL RATIO: 3.9 (ref <4.0)
TRIGLYCERIDE: 140 mg/dL (ref 35–160)

## 2018-05-12 LAB — HEMOGLOBIN A1C
HGB A1C,GLUCOSE EST AVG: 117 mg/dL
HGB A1C: 5.7 % — AB (ref 3.9–5.6)

## 2018-05-13 ENCOUNTER — Telehealth: Payer: Self-pay | Admitting: Family Medicine

## 2018-05-13 ENCOUNTER — Other Ambulatory Visit: Payer: Self-pay

## 2018-05-13 MED ORDER — DIAZEPAM 5 MG TABLET
5.0000 mg | ORAL_TABLET | Freq: Every day | ORAL | 0 refills | Status: DC | PRN
  Filled 2018-05-13: qty 30, 30d supply, fill #0

## 2018-05-13 NOTE — Telephone Encounter (Signed)
Prescription sent.  Please schedule follow-up with Dr. Sharen Counter at this time. Claudean Leavelle Eliezer Bottom, MD

## 2018-05-13 NOTE — Telephone Encounter (Signed)
Patient called requesting to speak with Medical Assistant regarding medications.  Patient did not provide more information.    Please call and advise.

## 2018-05-13 NOTE — Telephone Encounter (Signed)
Patient states email was sent to Dr. Sharen Counter since 05/05/2018, she needs medication and is requesting request to be sent urgently. She states her son was arrested for felonies and she is having a really hard time with it and needs her medication filled ASAP.    Refill requested printed for covering MD    Thank you,  Tiana Loft, [place] I

## 2018-05-13 NOTE — Telephone Encounter (Signed)
Notified and scheduled. Marisa Spindler, MA I

## 2018-05-26 ENCOUNTER — Ambulatory Visit: Payer: Commercial Managed Care - HMO | Admitting: Family Medicine

## 2018-05-26 DIAGNOSIS — R7303 Prediabetes: Secondary | ICD-10-CM

## 2018-05-26 DIAGNOSIS — E785 Hyperlipidemia, unspecified: Secondary | ICD-10-CM

## 2018-05-26 DIAGNOSIS — F419 Anxiety disorder, unspecified: Principal | ICD-10-CM

## 2018-05-26 DIAGNOSIS — I10 Essential (primary) hypertension: Secondary | ICD-10-CM

## 2018-05-26 NOTE — Video Visit Notes (Signed)
Video Visit Note     I performed this clinical encounter by utilizing a real time telehealth video connection between my location and the patient's location. The patient's location was confirmed during this visit. I obtained verbal consent from the patient to perform this clinical encounter utilizing video and prepared the patient by answering any questions they had about the telehealth interaction.    CHIEF COMPLAINT: anxiety, refill medication, labs    SUBJECTIVE: She is wanting to talk mainly about anxiety, and why she needed the refill of diazepam.  Usually only takes it a few times, in about 6 months recently has been taking about 20 tablets every 3 months.  Basically states that her 63 year old son is in jail for grand theft auto, and he was stealing to support his methamphetamine drug habit.  She had thought he had stopped using drugs, but is continuing to have problems.  She is really worried that he will never get his life together.  He has been asking her for money to post bail which she has done in the past but is not willing to do this time.  He has been in and out of jail 4 or 5 times.  She has been feeling very anxious, sad, and worrying excessively, having difficulty sleeping at night.  The diazepam helps a lot both with the sleep and the anxiety.  She has been resistant to taking any SSRI medications and does not really want to take anything every day.  She is hoping that symptoms will improve over time.  Also wants to go over test results that she did on March 3 which shows persistent hyperlipidemia but much improved.  Sugars are doing okay and she is eating a low sugar diet.  Has not been exercising much.    OBJECTIVE: well developed, well nourished 63 middle aged/ elderly woman appears upset, anxious, tearful about her son.  Worried he will never have a good life says he is 63 and too old to be doing stupid things.    She talked about coping mechanisms, talking to friends, now can't work/ staying  home due to Covid-19 makes her more anxious.   Lab Visit on 05/11/2018   Component Date Value Ref Range Status    Vitamin D, 25 Hydroxy 05/11/2018 23.9  20.0 - 50.0 ng/mL Final    White Blood Cell Count 05/11/2018 4.9  4.5 - 11.0 K/MM3 Final    Red Blood Cell Count 05/11/2018 4.16  4.00 - 5.20 M/MM3 Final    Hemoglobin 05/11/2018 13.7  12.0 - 16.0 g/dL Final    Hematocrit 05/11/2018 41.1  36.0 - 46.0 % Final    MCV 05/11/2018 98.7  80.0 - 100.0 fL Final    MCH 05/11/2018 33.0  27.0 - 33.0 pg Final    MCHC 05/11/2018 33.4  32.0 - 36.0 % Final    RDW 05/11/2018 12.6  0.0 - 14.7 % Final    MPV 05/11/2018 8.1  6.8 - 10.0 fL Final    Platelet Count 05/11/2018 223  130 - 400 K/MM3 Final    Cholesterol 05/11/2018 225* 0 - 200 mg/dL Final    HDL Cholesterol 05/11/2018 57  >=35 mg/dL Final    LDL Cholesterol Calculation 05/11/2018 140* <130 mg/dL Final    Total Cholesterol: HDL Ratio 05/11/2018 3.9  <4.0 Final    Triglyceride 05/11/2018 140  35 - 160 mg/dL Final    Non-HDL Cholesterol 05/11/2018 168* <=150 mg/dL Final    Desirable adult value < 130 mg/dL.  HGB A1C 05/11/2018 5.7* 3.9 - 5.6 % Final    5.7 - 6.4% indicates an increased risk for diabetes.  >6.4% is a criterion for the diagnosis of diabetes.    This boronate affinity Hb A1c method provides accurate analytical results in the presence of nearly all hemoglobin variants. Hb F higher than 15% of total Hb may yield falsely low results. Conditions that shorten red cell survival, such as the presence of unstable hemoglobins like Hb SS, Hb CC and Hb SC, or other causes of hemolytic anemia may yield falsely low results. Iron deficiency anemia may yield falsely high results.    HGB A1C,GLUCOSE EST AVG 05/11/2018 117  mg/dL Final    The estimated average glucose (eAG) calculation is based on hemoglobin A1c and glucose results from the ADAG study. The formula is eAG = (Hgb A1c x 28.7) - 46.7. The eAG study has not been validated in children or pregnant  women.  Also, eAG values are not valid for patients with hemoglobinopathies, anemia, and altered erythrocyte turnover as evidenced by blood loss, transfusions, chronic renal or liver disease, or for patients on high-dose vitamin C or erythropoietin treatments.    Sodium 05/11/2018 138  135 - 145 mmol/L Final    Potassium 05/11/2018 4.5  3.3 - 5.0 mmol/L Final    Chloride 05/11/2018 103  95 - 110 mmol/L Final    Carbon Dioxide Total 05/11/2018 26  24 - 32 mmol/L Final    Urea Nitrogen, Blood (BUN) 05/11/2018 20  8 - 22 mg/dL Final    Creatinine Serum 05/11/2018 0.64  0.44 - 1.27 mg/dL Final    Glucose 05/11/2018 122* 70 - 99 mg/dL Final    Calcium 05/11/2018 9.8  8.6 - 10.5 mg/dL Final    Protein 05/11/2018 7.7  6.3 - 8.3 g/dL Final    Albumin 05/11/2018 4.6  3.2 - 4.6 g/dL Final    Alkaline Phosphatase (ALP) 05/11/2018 73  35 - 115 U/L Final    Aspartate Transaminase (AST) 05/11/2018 26  15 - 43 U/L Final    Bilirubin Total 05/11/2018 0.5  0.3 - 1.3 mg/dL Final    Alanine Transferase (ALT) 05/11/2018 29  5 - 54 U/L Final    E-GFR, African American (Female) 05/11/2018 >100  >60 See Note mL/min/1.71m*2 Final    E-GFR, Non-African American (Femal* 05/11/2018 96  >60 See Note mL/min/1.52m*2 Final          ASSESSMENT & PLAN: (F41.9) Anxiety  (primary encounter diagnosis)  Comment: reviewed overall treatment recommendations, stress reduction, regular exercise, learn to not be enabling, counselling, social supports and medication  Plan: diazepam was refilled.  Advised to take it sparingly, which she usually does.  Taking it more than usual due to current life stressors.  Consider therapy. She refused ssri or any medication that would need to be taken regularly.     (R73.03) Prediabetes  Comment: reviewed last test results.  Sugars overall are doing well  Plan: continue on low sugar diet and strongly advised daily exercise for 10 to 15 minutes at minimum.     (I10) Benign essential hypertension  Comment:  blood pressure is well controlled.   Plan: continue metoprolol.     (E78.5) Hyperlipidemia with target LDL less than 130  Comment: taking lipitor regularly.  Discussed other options since lipitor may cause sugar elevation  Plan: she did not want to change medication currently.       Approximately 20 minutes were spent with the patient, of which  more than 50% of the time was spent in counseling and/or coordinating care on anxiety. The patient understands and agrees with the plan of care outlined.

## 2018-07-13 ENCOUNTER — Other Ambulatory Visit: Payer: Self-pay | Admitting: Family Medicine

## 2018-07-13 ENCOUNTER — Other Ambulatory Visit: Payer: Self-pay

## 2018-07-13 DIAGNOSIS — I10 Essential (primary) hypertension: Secondary | ICD-10-CM

## 2018-07-13 MED ORDER — METOPROLOL SUCCINATE ER 50 MG TABLET,EXTENDED RELEASE 24 HR
50.0000 mg | EXTENDED_RELEASE_TABLET | Freq: Every day | ORAL | 3 refills | Status: DC
  Filled 2018-07-13: qty 90, 90d supply, fill #0
  Filled 2018-10-21: qty 90, 90d supply, fill #1
  Filled 2019-02-26: qty 90, 90d supply, fill #2
  Filled 2019-05-30: qty 90, 90d supply, fill #3

## 2018-07-14 ENCOUNTER — Other Ambulatory Visit: Payer: Self-pay

## 2018-08-06 ENCOUNTER — Other Ambulatory Visit: Payer: Self-pay

## 2018-08-06 ENCOUNTER — Other Ambulatory Visit: Payer: Self-pay | Admitting: Family Medicine

## 2018-08-06 DIAGNOSIS — E7800 Pure hypercholesterolemia, unspecified: Secondary | ICD-10-CM

## 2018-08-07 MED ORDER — ATORVASTATIN 20 MG TABLET
20.0000 mg | ORAL_TABLET | Freq: Every day | ORAL | 1 refills | Status: DC
  Filled 2018-08-07: qty 90, 90d supply, fill #0
  Filled 2018-12-06: qty 90, 90d supply, fill #1

## 2018-08-09 ENCOUNTER — Other Ambulatory Visit: Payer: Self-pay

## 2018-08-10 ENCOUNTER — Other Ambulatory Visit: Payer: Self-pay

## 2018-08-24 ENCOUNTER — Other Ambulatory Visit: Payer: Self-pay

## 2018-10-21 ENCOUNTER — Other Ambulatory Visit: Payer: Self-pay

## 2018-11-10 ENCOUNTER — Other Ambulatory Visit: Payer: Self-pay | Admitting: Family Medicine

## 2018-11-10 DIAGNOSIS — Z1231 Encounter for screening mammogram for malignant neoplasm of breast: Secondary | ICD-10-CM

## 2018-12-06 ENCOUNTER — Other Ambulatory Visit: Payer: Self-pay

## 2018-12-22 ENCOUNTER — Telehealth: Payer: Self-pay | Admitting: Family Medicine

## 2018-12-22 NOTE — Telephone Encounter (Signed)
I did not receive any request for a refill for diazepam since 02/08/2018.  Please let Marisa Jenkins know that I will need to see her before I can refill the medication since it has been over 6 months since her last appointment on 05/26/2018.  She is also due for an annual exam appointment.  Please assist with scheduling an annual exam.   [name], MD

## 2018-12-22 NOTE — Telephone Encounter (Signed)
3 patient identifiers used.  Per:   patient.    Disposition: Consult with MD, please advise  Request Rx from MD, pharmacy verified.   Per:   patient verbalizes agreement to plan. Agrees to callback with any increase in symptoms/concerns or questions.    See Assessment Below  ClearTriage Note: pt requesting a refill of her diazepam 5mg .   States requested one month ago, but never heard back.    Protocol Used: Medication Question Call (Adult)  Protocol-Based Disposition: Call PCP or See by Video within 24 Hours    Video visit not offered    Positive Triage Question:  * [1] Caller requesting a NON-URGENT new prescription or refill AND [2] triager unable to refill per unit policy    Negative Triage Questions:  * MORE THAN A DOUBLE DOSE of a prescription or over-the-counter (OTC) drug  * [1] DOUBLE DOSE (an extra dose or lesser amount) of over-the-counter (OTC) drug AND [2] any symptoms (e.g., dizziness, nausea, pain, sleepiness)  * [1] DOUBLE DOSE (an extra dose or lesser amount) of prescription drug AND [2] any symptoms (e.g., dizziness, nausea, pain, sleepiness)  * Took another person's prescription drug  * [1] DOUBLE DOSE (an extra dose or lesser amount) of prescription drug AND [2] NO symptoms (Exception: a double dose of antibiotics)  * Diabetes drug error or overdose (e.g., took wrong type of insulin or took extra dose)  * [1] Request for URGENT new prescription or refill of "essential" medication (i.e., likelihood of harm to patient if not taken) AND [2] triager unable to fill per unit policy  * [id number] Prescription not at pharmacy AND [2] was prescribed by PCP recently  * [1] Pharmacy calling with prescription questions AND [2] triager unable to answer question  * [1] Caller has URGENT medication question about med that PCP or specialist prescribed AND [2] triager unable to answer question  * [1] Caller has NON-URGENT medication question about med that PCP prescribed AND [2] triager unable to answer question    Pia Mau, RN  PCN [facility]

## 2018-12-23 ENCOUNTER — Ambulatory Visit
Admission: RE | Admit: 2018-12-23 | Discharge: 2018-12-23 | Disposition: A | Discharge status: Home / Self Care | Payer: Commercial Managed Care - HMO | Source: Ambulatory Visit | Attending: Radiologic Technologist | Admitting: Radiologic Technologist

## 2018-12-23 ENCOUNTER — Ambulatory Visit: Payer: Commercial Managed Care - HMO | Attending: Emergency Medicine

## 2018-12-23 DIAGNOSIS — Z1231 Encounter for screening mammogram for malignant neoplasm of breast: Secondary | ICD-10-CM | POA: Insufficient documentation

## 2018-12-23 DIAGNOSIS — Z23 Encounter for immunization: Secondary | ICD-10-CM | POA: Insufficient documentation

## 2018-12-23 NOTE — Telephone Encounter (Signed)
Notified and scheduled. Kaiesha Tonner, MA I

## 2018-12-23 NOTE — Nursing Note (Signed)
The patient has received a flu vaccine previously: yes  The Influenza Vaccine VIS document for the flu injection was given to patient to review. Patient or person named in permission has answered no to any history of egg allergy, previous serious reaction to a influenza vaccine or current illness which would preclude them receiving an immunization via protocol and policy 2041 without additional physician input. Any questions were referred to the physician. The Influenza Vaccine was then administered per protocol or by Policy 2041 using the standing order from Dr. James Kirk (Academic) or Dr. Kurt Slapnik (Network). The patient was observed for immediate reactions to the vaccine per protocol. No reaction was observed.   Valery Amedee, LVN

## 2018-12-28 ENCOUNTER — Other Ambulatory Visit: Payer: Self-pay

## 2018-12-28 ENCOUNTER — Ambulatory Visit: Payer: Commercial Managed Care - HMO | Admitting: Family Medicine

## 2018-12-28 ENCOUNTER — Encounter: Payer: Self-pay | Admitting: Family Medicine

## 2018-12-28 VITALS — BP 122/78 | HR 75 | Temp 96.9°F | Ht 63.78 in | Wt 136.5 lb

## 2018-12-28 DIAGNOSIS — F411 Generalized anxiety disorder: Secondary | ICD-10-CM

## 2018-12-28 DIAGNOSIS — E7800 Pure hypercholesterolemia, unspecified: Secondary | ICD-10-CM

## 2018-12-28 DIAGNOSIS — Z01419 Encounter for gynecological examination (general) (routine) without abnormal findings: Secondary | ICD-10-CM

## 2018-12-28 DIAGNOSIS — Z0000 Encounter for general adult medical examination without abnormal findings: Secondary | ICD-10-CM

## 2018-12-28 DIAGNOSIS — D126 Benign neoplasm of colon, unspecified: Secondary | ICD-10-CM

## 2018-12-28 DIAGNOSIS — G43009 Migraine without aura, not intractable, without status migrainosus: Secondary | ICD-10-CM

## 2018-12-28 DIAGNOSIS — E785 Hyperlipidemia, unspecified: Secondary | ICD-10-CM

## 2018-12-28 DIAGNOSIS — R7303 Prediabetes: Secondary | ICD-10-CM

## 2018-12-28 HISTORY — DX: Benign neoplasm of colon, unspecified: D12.6

## 2018-12-28 MED ORDER — ATORVASTATIN 20 MG TABLET
20.0000 mg | ORAL_TABLET | Freq: Every day | ORAL | 1 refills | Status: DC
  Filled 2018-12-28: fill #0
  Filled 2019-02-26: qty 90, 90d supply, fill #0
  Filled 2019-05-30: qty 90, 90d supply, fill #1

## 2018-12-28 MED ORDER — DIAZEPAM 5 MG TABLET
5.0000 mg | ORAL_TABLET | Freq: Every day | ORAL | 0 refills | Status: DC | PRN
  Filled 2018-12-28: qty 30, 30d supply, fill #0

## 2018-12-28 MED ORDER — BUTALBITAL-ASPIRIN-CAFFEINE 50 MG-325 MG-40 MG CAPSULE
1.0000 | ORAL_CAPSULE | Freq: Four times a day (QID) | ORAL | 0 refills | Status: DC | PRN
  Filled 2018-12-28: qty 25, 7d supply, fill #0

## 2018-12-28 NOTE — Nursing Note (Signed)
Vital signs taken,tobacco,allergies,pharmacy verified, screened for pain.  Ahonesty Woodfin, MA    Patient WAS wearing a surgical mask  Droplet precautions were followed when caring for the patient.   PPE used by provider during encounter: Surgical mask and Face Shield/Goggles

## 2018-12-28 NOTE — Patient Instructions (Addendum)
Please redo your fasting blood sugar and lipid panel since your last levels were abnormal and you are due for a follow up A1c average sugar test as well as a urine test to check on how your kidneys are doing.    Prediabetes: Care Instructions  Your Care Instructions  Prediabetes is a warning sign that you are at risk for getting type 2 diabetes. It means that your blood sugar is higher than it should be. Most people who get type 2 diabetes have prediabetes first. The good news is that lifestyle changes may help you get your blood sugar back to normal and avoid or delay diabetes. Also, pregnant women who get gestational diabetes may have prediabetes first.  Type 2 diabetes is a lifelong disease in which the body does not respond properly to a hormone called insulin or does not make enough of the hormone. Insulin helps sugar from your food enter your body cells to be used as energy.  Without insulin, the sugar cannot get into the cells to do its work. It stays in the blood instead. This can cause high blood sugar levels. A person has diabetes when the blood sugar stays too high too much of the time.  Follow-up care is a key part of your treatment and safety. Be sure to make and go to all appointments, and call your doctor if you are having problems. It's also a good idea to know your test results and keep a list of the medicines you take.  How can you care for yourself at home?   Watch your weight. A healthy weight helps your body use insulin properly.   Eat a balanced diet. This may help you prevent or delay diabetes. Try to eat an even amount of carbohydrate throughout the day. This can help you avoid sudden peaks in blood sugar.   Ask your doctor if you should see a dietitian. A registered dietitian can help you develop a meal plan that fits your lifestyle.   Get at least 30 minutes of exercise on most days of the week. Exercise helps control your blood sugar. It also helps you maintain a healthy weight.  Walking is a good choice. You also may want to do other activities, such as running, swimming, cycling, or playing tennis or team sports.   Do not smoke. Smoking can make prediabetes worse. If you need help quitting, talk to your doctor about stop-smoking programs and medicines. These can increase your chances of quitting for good.   If your doctor prescribed medicines, take them exactly as prescribed. Call your doctor if you think you are having a problem with your medicine. You will get more details on the specific medicines your doctor prescribes.  When should you call for help?  Watch closely for changes in your health, and be sure to contact your doctor if:   You have any symptoms of diabetes. These may include:   Being thirsty more often.   Urinating more.   Being hungrier.   Losing weight.   Being very tired.   Having blurry vision.   You have a wound that will not heal.   You have an infection that will not go away.   You have problems with your blood pressure.   You want more information about diabetes and how you can keep from getting it.   Where can you learn more?   Go to http://blackburn.com/  Enter I222 in the search box to learn more about "Prediabetes: Care Instructions."  2006-2015 [employer]. Care instructions adapted under license by [facility]. This care instruction is for use with your licensed healthcare professional. If you have questions about a medical condition or this instruction, always ask your healthcare professional. [place] any warranty or liability for your use of this information.  Content Version: 10.6.465758; Current as of: Jul 29, 2013              Hyperlipidemia: After Your Visit  Your Care Instructions  Hyperlipidemia is too much fat in your blood. The body has several kinds of fat, including cholesterol and triglycerides. Your body needs fat for many things, such as making new cells. But too  much fat in your blood increases your chances of having a heart attack or stroke.  You may be able to lower your cholesterol and triglycerides with a heart-healthy diet, exercise, and if needed, medicine. Your doctor may want you to try lifestyle changes first to see whether they lower the fat in your blood. You may need to take medicine if lifestyle changes do not lower the fat in your blood enough.  Follow-up care is a key part of your treatment and safety. Be sure to make and go to all appointments, and call your doctor if you are having problems. It's also a good idea to know your test results and keep a list of the medicines you take.  How can you care for yourself at home?  Take your medicines   Take your medicines exactly as prescribed. Call your doctor if you think you are having a problem with your medicine.   If you take medicine to lower your cholesterol, go to follow-up visits. You will need to have blood tests.   Do not take large doses of niacin, which is a B vitamin, while taking medicine called statins. It may increase the chance of muscle pain and liver problems.   Talk to your doctor about avoiding grapefruit juice if you are taking statins. Grapefruit juice can raise the level of this medicine in your blood. This could increase side effects.  Eat more fruits, vegetables, and fiber   Fruits and vegetables have lots of nutrients that help protect against heart disease, and they have little--if any--fat. Try to eat at least five servings a day. Dark green, deep [place], or yellow fruits and vegetables are healthy choices.   Keep carrots, celery, and other veggies handy for snacks. Buy fruit that is in season and store it where you can see it so that you will be tempted to eat it. Cook dishes that have a lot of veggies in them, such as stir-fries and soups.   Foods high in fiber may reduce your cholesterol and provide important vitamins and minerals. High-fiber foods include whole-grain cereals  and breads, oatmeal, beans, brown rice, citrus fruits, and apples.   Buy whole-grain breads and cereals instead of white bread and pastries.  Limit saturated fat   Read food labels and try to avoid saturated fat and trans fat. They increase your risk of heart disease.   Use olive or canola oil when you cook. Try cholesterol-lowering spreads, such as Benecol or Take Control.   Bake, broil, grill, or steam foods instead of frying them.   Limit the amount of high-fat meats you eat, including hot dogs and sausages. Cut out all visible fat when you prepare meat.   Eat fish, skinless poultry, and soy products such as tofu instead of high-fat meats. Soybeans may be  especially good for your heart. Eat at least two servings of fish a week. Certain fish, such as salmon, contain omega-3 fatty acids, which may help reduce your risk of heart attack.   Choose low-fat or fat-free milk and dairy products.  Get exercise, limit alcohol, and quit smoking   Get more exercise. Work with your doctor to set up an exercise program. Even if you can do only a small amount, exercise will help you get stronger, have more energy, and manage your weight and your stress. Walking is an easy way to get exercise. Gradually increase the amount you walk every day. Aim for at least 30 minutes on most days of the week. You also may want to swim, bike, or do other activities.   Limit alcohol to no more than 2 drinks a day for men and 1 drink a day for women.   Do not smoke. If you need help quitting, talk to your doctor about stop-smoking programs and medicines. These can increase your chances of quitting for good.  When should you call for help?  Call 911 anytime you think you may need emergency care. For example, call if:   You have symptoms of a heart attack. These may include:   Chest pain or pressure, or a strange feeling in the chest.   Sweating.   Shortness of breath.   Nausea or vomiting.   Pain, pressure, or a strange feeling in  the back, neck, jaw, or upper belly or in one or both shoulders or arms.   Lightheadedness or sudden weakness.   A fast or irregular heartbeat.  After you call 911, the operator may tell you to chew 1 adult-strength or 2 to 4 low-dose aspirin. Wait for an ambulance. Do not try to drive yourself.   You have signs of a stroke. These may include:   Sudden numbness, paralysis, or weakness in your face, arm, or leg, especially on only one side of your body.   New problems with walking or balance.   Sudden vision changes.   Drooling or slurred speech.   New problems speaking or understanding simple statements, or feeling confused.   A sudden, severe headache that is different from past headaches.   You passed out (lost consciousness).  Call your doctor now or seek immediate medical care if:   You have muscle pain or weakness.  Watch closely for changes in your health, and be sure to contact your doctor if:   You are very tired.   You have an upset stomach, gas, constipation, or belly pain or cramps.   Where can you learn more?   Go to http://blackburn.com/  Enter C406 in the search box to learn more about "Hyperlipidemia: After Your Visit."    2006-2013 Health[employer], [employer]. Care instructions adapted under license by [facility]. This care instruction is for use with your licensed healthcare professional. If you have questions about a medical condition or this instruction, always ask your healthcare professional. [place] any warranty or liability for your use of this information.  Content Version: 9.7.145117; Last Revised: December 20, 2009

## 2018-12-28 NOTE — Progress Notes (Signed)
Patient WAS wearing a surgical mask  Contact and Droplet precautions were followed when caring for the patient.   PPE used by provider during encounter: Surgical mask and Face Shield/Goggles      12/28/2018     Chief Complaint   Patient presents with    Physical        SUBJECTIVE: Marisa Jenkins is a 63yr old female who presents today for a routine prevention physical.   Her last physical exam was 1 years ago.   In general she has been dealing with a lot of work stress    Difficult to work from home - she works in [employer] area, husband works in the bedroom.  Exhusband's dad died from Covid-57.  Son got Covid-19 but did ok.  She is keeping mask on when outdoors and mostly staying home.      OB/GYN HISTORY: Menstrual history :  Patient's last menstrual period was 10/28/2010.  No postmenopausal bleeding       Last pap smear test was 2017 and it was normal HPV negative   Last mammogram was 12/23/2018 and it was normal.   Last colonoscopy was 2017 and next colonoscopy is due now since she had a serrated adenoma  Last labs were 05/11/2018 and showed elevated glucose 122 and elevated cholesterol 225 with LDL 140.  A1c 5.7.     Vaccines are up to date.      reports that she has quit smoking. She has never used smokeless tobacco. She reports current alcohol use. She reports that she does not use drugs.   She exercises sporadically.    She is occasionally performing periodic breast self examinations. She has not noted any breast problems.     ROS:   Review of Systems   Constitutional: Positive for activity change.   HENT: Negative.    Eyes: Negative for pain and redness.        Has had recent eye exam, no significant change with vision   Cardiovascular: Negative.    Genitourinary: Negative.    Musculoskeletal: Negative.    Neurological: Positive for headaches.   Hematological: Negative.    Psychiatric/Behavioral: Positive for sleep disturbance. The patient is nervous/anxious.           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    on holter monitor; some quadrigeminy    NAFLD (nonalcoholic fatty liver disease) 03/06/2017    seen on abdominal US    Pain in joint of right hand 2009    thumb joint    Postmenopausal HRT (hormone replacement therapy) 04/29/2012    Prediabetes 08/19/2017    Rectal cancer ([place]) 11/2014    found in rectal polyp, removed during colonoscopy    Right kidney stone 03/06/2017    seen on abdominal US, 5 mm nonobstructing    Sciatica 2006    Thrombocytopenia ([place]) 06/11/2006    Tubular adenoma 08/23/2014      Past Surgical History:   Procedure Laterality Date  BIOPSY, BREAST  2008    benign    BIOPSY, BREAST Left 10/2009    benign    COLONOSCOPY  11/24/2014    large pedunculated polyp    COLONOSCOPY  11/24/2014    COLONOSCOPY  06/22/2015    serrated tubular adenoma; no recurrence of rectal cancer hyperplastic polyp, repeat in 3 years    ESOPHAGOGASTRODUODENOSCOPY (EGD)  11/24/2014    small antral ulcerations    FLEXIBLE SIGMOIDOSCOPY  03/16/2015    HC UPPER GI ENDOSCOPY,BIOPSY  11/24/2014    PR COLSC FLX W/RMVL OF TUMOR POLYP LESION SNARE TQ  04/22/2011    tubular adenoma: recheck 05/2014    PR COLSC FLX W/RMVL OF TUMOR POLYP LESION SNARE TQ  05/18/06    tubular adenoma    PR LAP PROCEDURE, UNLISTED, BLADDER  2003    bladder repair    PR REMOVAL OF LEG VEINS/ULCER  02/2003    vein stripping  Dr. Vira Agar    PR SIGMOIDOSCOPY,BIOPSY  03/16/2015    TONSILLECTOMY      TONSILLECTOMY AND ADENOIDECTOMY  1962        ALLERGIES:   Allergies   Allergen Reactions     Clindamycin Other-Reaction in Comments     heartburn    Penicillin G Hives     About 2010          Immunization History   Administered Date(s) Administered    Influenza Vaccine, Quadrivalent (Fluarix/Flulaval/Fluzone Prefill Syringe) 12/15/2017, 12/23/2018    Influenza Vaccine, Quadrivalent (Flulaval) 12/04/2015, 12/30/2016    Influenza Vaccine, Quadrivalent (Fluzone/Fluarix) 01/04/2013, 12/29/2013, 12/12/2014    Td (Adult) Preservative Free 12/04/2005    Tdap 11/24/2008    Tdap (Boostrix) 03/11/2013, 12/28/2013    Zoster Vaccine (live) 07/19/2015    Zoster Vaccine (recombinant) 06/26/2016, 01/15/2017        OBJECTIVE:  LMP 10/28/2010    Patient's last menstrual period was 10/28/2010.    BP 122/78 (SITE: left arm, Orthostatic Position: sitting, Cuff Size: regular)   Pulse 75   Temp 36.1 C (96.9 F) (Tympanic)   Ht 1.62 m (5' 3.78")   Wt 61.9 kg (136 lb 7.4 oz)   LMP 10/28/2010   BMI 23.59 kg/m    General Appearance: healthy, alert, no distress, pleasant affect, cooperative.  Eyes:  conjunctivae and corneas clear. PERRL, EOM's intact. sclerae normal.  Ears:  normal TMs and canal and external inspection of ears show no abnormality.  Nose:  normal.  Mouth: normal.  Neck:  Neck supple. No adenopathy, thyroid symmetric, normal size.  Heart:  normal rate and regular rhythm, no murmurs, clicks, or gallops.  Lungs: clear to auscultation.  Breast:  negative.  Abdomen: BS normal.  Abdomen soft, non-tender.  No masses or organomegaly.  Extremities:  no cyanosis, clubbing, or edema and distal pulses normal.  Skin:  Skin color, texture, turgor normal. No rashes or lesions.  Pelvic:  External genitalia and vagina normal.  Rectal: not done, gastrointestinal referral sent  Neuro: Gait normal. Reflexes normal and symmetric. Sensation and strength grossly normal.           ASSESSMENT:  (Z00.00) Routine general medical examination at a health care facility  (primary encounter diagnosis)  Comment:   Plan: Lipid Panel  with DLDL Reflex, Comprehensive         Metabolic Panel          ([id number]) Prediabetes  Comment: low sugar diet  Plan: Hemoglobin A1C, Microalbumin, Creatinine Spot  Urine, Comprehensive Metabolic Panel            (E78.5) Hyperlipidemia with target LDL less than 130  Comment:   Plan: Lipid Panel with DLDL Reflex, Atorvastatin         (LIPITOR) 20 mg Tablet, Comprehensive Metabolic        Panel            (D12.6) Serrated adenoma of colon  Comment:   Plan: Gastroenterology Referral        High fiber diet    (E78.00) Pure hypercholesterolemia  Comment:   Plan:     (F41.1) Generalized anxiety disorder  Comment:   Plan: Diazepam (VALIUM) 5 mg Tablet        Advised to take sparingly, only if other measures fail to improve     (G43.009) Migraine without aura and without status migrainosus, not intractable  Comment:   Plan: Aspirin 325mg /Caffeine 40mg /Butalbital 50mg          (FIORINAL) per capsule                   Prevention counseling: The patient was counseled regarding routine female prevention issues to include breast self examination, annual mammograms. She was also counseled regarding screening for hyperlipidemia, healthy diet and exercise, routine immunizations, sun protection and routine examinations. A Gynecologic Cancers pamphlet was  provided to the patient. Counselling in regards to timing for colon cancer screening and types of available screening discussed. Self care measures discussed include skin care, dental hygiene and cleanings, seatbelt and helmet use.     Advised annual follow up exam.    I reviewed the patient's past medical and family/social history, and updated problem list and past medical history .  Barriers to Learning assessed: none. Patient verbalizes understanding of teaching and instructions.  [name], MD  Family Practice, [facility]

## 2018-12-29 MED FILL — flu vaccine qs 2020-21(6mos up)(PF) 60 mcg(15 mcgx4)/0.5 mL IM syringe: INTRAMUSCULAR | Qty: 0.5 | Status: AC

## 2019-01-06 ENCOUNTER — Other Ambulatory Visit: Payer: Self-pay | Admitting: GASTROENTEROLOGY

## 2019-01-06 ENCOUNTER — Other Ambulatory Visit: Payer: Self-pay

## 2019-01-06 DIAGNOSIS — Z01818 Encounter for other preprocedural examination: Secondary | ICD-10-CM

## 2019-01-06 MED ORDER — PEG3350 100 GRAM-SOD SULF 7.5 GRAM-NACL-KCL-ASCORBATE-C ORAL PWDR PACK
ORAL | 0 refills | Status: DC
Start: 2019-01-06 — End: 2019-02-10
  Filled 2019-01-06: fill #0
  Filled 2019-02-08: qty 1, 1d supply, fill #0

## 2019-02-01 ENCOUNTER — Telehealth: Payer: Self-pay | Admitting: GASTROENTEROLOGY

## 2019-02-01 NOTE — Telephone Encounter (Signed)
Pre procedure call. Patient answered but phone kept breaking up and was disconnected. Called back and patient was having a difficult time hearing due to bad connection. Offered to call back and leave message, patient agreed. Left message on patient's voicemail regarding upcoming GI appointment. Instructed to read instructions at least a week prior to appointment, secure ride home. Practice self isolation between Covid-19 test and GI procedure. Also, to wear a mask at all times when outside of home, practice social distancing and good hand washing. If any cough, fever above 100F (37.8), shortness of breath,  H/A, vomiting, diarrhea, abdominal pain or rash, cough, fever, shortness of breath, body aches, sore throat, chills, loss of taste or smell call [facility] until 4:30pm M-F. If after hours or weekends call Hospital operator at [phone] and ask for GI on call Physician.   Patient is scheduled for colonoscopy.    Frederik Schmidt Senior [facility]  [phone]

## 2019-02-08 ENCOUNTER — Other Ambulatory Visit: Payer: Self-pay

## 2019-02-08 ENCOUNTER — Ambulatory Visit: Payer: Commercial Managed Care - HMO | Attending: GASTROENTEROLOGY

## 2019-02-08 DIAGNOSIS — Z20828 Contact with and (suspected) exposure to other viral communicable diseases: Secondary | ICD-10-CM | POA: Insufficient documentation

## 2019-02-08 DIAGNOSIS — Z01818 Encounter for other preprocedural examination: Secondary | ICD-10-CM | POA: Insufficient documentation

## 2019-02-08 LAB — COVID-2019 RNA, QUAL: SARS-CoV-2: NOT DETECTED

## 2019-02-08 MED ORDER — PEG 3350-ELECTROLYTES 236 GRAM-22.74 GRAM-6.74 GRAM-5.86 GRAM SOLUTION
ORAL | 0 refills | Status: DC
  Filled 2019-02-08: qty 4000, 1d supply, fill #0

## 2019-02-08 NOTE — Nursing Note (Signed)
Patient identified using two identifiers.   The following procedure was completed per physician's order:  COVID 19 Test  Provided patient information regarding procedure  Nasopharyngeal swab collected on both sides of the patient's nose:  Yes     COVID specimen was sent to lab.     Droplet precautions were followed when caring the patient:  Patient was wearing a mask: Yes     During procedure, I was wearing full PPE: gown, face shield/googles, gloves, and N95 mask.  Patient remained in their vehicle during specimen collection and did not enter the clinic.  Patient tolerated procedure well.    Tressy Kunzman, LVN

## 2019-02-10 ENCOUNTER — Encounter: Payer: Self-pay | Admitting: GASTROENTEROLOGY

## 2019-02-10 ENCOUNTER — Ambulatory Visit: Payer: Commercial Managed Care - HMO | Attending: Cytopathology | Admitting: GASTROENTEROLOGY

## 2019-02-10 VITALS — BP 132/73 | HR 66 | Temp 96.1°F | Resp 14 | Ht 65.0 in | Wt 133.4 lb

## 2019-02-10 DIAGNOSIS — D126 Benign neoplasm of colon, unspecified: Secondary | ICD-10-CM | POA: Insufficient documentation

## 2019-02-10 DIAGNOSIS — Z85048 Personal history of other malignant neoplasm of rectum, rectosigmoid junction, and anus: Secondary | ICD-10-CM | POA: Insufficient documentation

## 2019-02-10 DIAGNOSIS — Z85038 Personal history of other malignant neoplasm of large intestine: Secondary | ICD-10-CM | POA: Insufficient documentation

## 2019-02-10 DIAGNOSIS — K635 Polyp of colon: Secondary | ICD-10-CM

## 2019-02-10 HISTORY — PX: COLONOSCOPY: SHX000273

## 2019-02-10 MED ORDER — DIPHENHYDRAMINE 50 MG/ML INJECTION SOLUTION
INTRAMUSCULAR | 0 refills | Status: DC
Start: 2019-02-10 — End: 2019-02-10

## 2019-02-10 MED ORDER — SODIUM CHLORIDE 0.9 % INTRAVENOUS SOLUTION
INTRAVENOUS | 0 refills | Status: AC
Start: 2019-02-10 — End: 2019-02-11

## 2019-02-10 MED ORDER — MIDAZOLAM (PF) 1 MG/ML INJECTION SOLUTION
INTRAMUSCULAR | 0 refills | Status: DC
Start: 2019-02-10 — End: 2019-02-10

## 2019-02-10 MED ORDER — SODIUM CHLORIDE 0.9 % INTRAVENOUS SOLUTION
INTRAVENOUS | 0 refills | Status: DC
Start: 2019-02-10 — End: 2019-02-10

## 2019-02-10 MED ORDER — FENTANYL (PF) 50 MCG/ML INJECTION SOLUTION
INTRAMUSCULAR | 0 refills | Status: DC
Start: 2019-02-10 — End: 2019-02-10

## 2019-02-10 NOTE — Progress Notes (Signed)
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       Currently, feeling well, no acute symptoms.       Hemoglobin (g/dL)   Date Value   05/11/2018 13.7   06/28/2015 14.1     Platelet Count   Date Value Ref Range Status   05/11/2018 223 130 - 400 K/MM3 Final   06/28/2015 219 130 - 400 K/MM3 Final     No results found for: INR  CREATININE BLOOD   Date Value Ref Range Status   01/25/2015 0.66 0.44 - 1.27 mg/dL Final     Creatinine Serum   Date Value Ref Range Status   05/11/2018 0.64 0.44 - 1.27 mg/dL Final       History:  Past Medical History:   Diagnosis Date    Allergic conjunctivitis of both eyes 06/27/2016    Allergic rhinitis 04/29/2012    Anxiety disorder 04/29/2012    Benign essential hypertension 07/14/2017    Dysfunction of left eustachian tube 09/23/2016    Elevated liver enzymes     Hyperlipidemia 04/29/2012    Impaired fasting glucose     Internal hemorrhoids 02/27/2006    Menorrhagia     Migraine headache 04/29/2012    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    on holter monitor; some quadrigeminy    NAFLD (nonalcoholic fatty liver disease) 03/06/2017    seen on abdominal US    Pain in joint of right hand 2009    thumb joint    Postmenopausal HRT (hormone replacement therapy) 04/29/2012    Prediabetes 08/19/2017    Rectal cancer ([place]) 11/2014    found in rectal polyp, removed during colonoscopy    Right kidney stone 03/06/2017    seen on abdominal US, 5 mm nonobstructing    Sciatica 2006    Serrated adenoma of colon 12/28/2018    Thrombocytopenia ([place]) 06/11/2006    Tubular adenoma 08/23/2014         Current  Outpatient Medications:     Aspirin 325mg /Caffeine 40mg /Butalbital 50mg  (FIORINAL) per capsule, Take 1 capsule by mouth 4 times daily if needed. Not to exceed 4 capsules in 24 hours.  Avoid taking within 8 hours of taking valium, may cause palpitations, Disp: 25 capsule, Rfl: 0    Atorvastatin (LIPITOR) 20 mg Tablet, Take 1 tablet by mouth every day., Disp: 90 tablet, Rfl: 1    Cetirizine (ZYRTEC) 10 mg Tablet, Take 1 tablet by mouth once daily if needed for Allergies., Disp: 30 tablet, Rfl: 5    Cyanocobalamin (VITAMIN B-12) 1,000 mcg Sublingual Tablet, Dissolve 1,000 mcg under the tongue every day., Disp: , Rfl:     Diazepam (VALIUM) 5 mg Tablet, Take 1 tablet by mouth once daily if needed for anxiety or insomnia., Disp: 30 tablet, Rfl: 0    DiphenhydrAMINE (BENADRYL) 50 mg/mL Injection, 12.5-25 mg IV every 3 min PRN (NTE 50 mg); Give 25mg  PRN inadequate sedation after Midazolam given or as directed by MD, Disp: 2 mL, Rfl: 0    FamoTIDine (PEPCID) 40 mg Tablet, Take 1 tablet by mouth every day at bedtime., Disp: 90 tablet, Rfl:  3    Fentanyl (SUBLIMAZE) 50 mcg/mL Injection, 12.5-50 mcg IV every 3 min PRN pain (NTE 200 mcg); Give 25 mcg until patient sedated but arousable or as directed by MD, Disp: 4 mL, Rfl: 0    Metoprolol Succinate (TOPROL XL) 50 mg SR Tablet, Take 1 tablet by mouth every day., Disp: 90 tablet, Rfl: 3    Midazolam (VERSED) 1 mg/mL Injection, 0.5-2 mg IV every 3 min PRN sedation (NTE 10 mg); Give 1 mg until patient sedated but arousable or as directed by MD, Disp: 10 mL, Rfl: 0    NaCl 0.9% Infusion, IV at [place] or as directed by MD;  May use another 500 mL bag as needed., Disp: 1000 mL, Rfl: 0    Omega-3-DHA-EPA-Fish Oil (FISH OIL) 1,000 mg (120 mg-180 mg) Capsule, Take 2,000 mg by mouth every day., Disp: 60 capsule, Rfl: 11    PEG 3350-Electrolytes-Vit C (MOVIPREP) 100-7.5-2.691 gram Powder in Packet, Take as directed on gi lab instruction sheet (may substitute with gavilyte C, G or  N: 1 bottle, 0 refills, take as directed on gi lab instruction sheet), Disp: 1 packet, Rfl: 0    PEG-Electrolyte Soln (GAVILYTE-G) 236-22.74-6.74 -5.86 gram Liquid, Take as directed on gi lab instruction sheet, Disp: 4000 mL, Rfl: 0    Social History:  Social History     Socioeconomic History    Marital status: MARRIED     Spouse name: Not on file    Number of children: Not on file    Years of education: Not on file    Highest education level: Not on file   Occupational History    Not on file   Social Needs    Financial resource strain: Not on file    Food insecurity     Worry: Not on file     Inability: Not on file    Transportation needs     Medical: Not on file     Non-medical: Not on file   Tobacco Use    Smoking status: Former Smoker    Smokeless tobacco: Never Used    Tobacco comment: quit at age 53   Substance and Sexual Activity    Alcohol use: Yes     Alcohol/week: 0.0 standard drinks     Comment: 1 glass per day, vodka tonic    Drug use: No    Sexual activity: Yes     Partners: Male     Comment: married   Lifestyle    Physical activity     Days per week: Not on file     Minutes per session: Not on file    Stress: Not on file   Relationships    Social connections     Talks on phone: Not on file     Gets together: Not on file     Attends religious service: Not on file     Active member of club or organization: Not on file     Attends meetings of clubs or organizations: Not on file     Relationship status: Not on file    Intimate partner violence     Fear of current or ex partner: Not on file     Emotionally abused: Not on file     Physically abused: Not on file     Forced sexual activity: Not on file   Other Topics Concern    Not on file   Social History Narrative    2nd marriage, married to  same man for 24 years     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     3 children oldest 69 boy and 42 boy and 81 boy all 3 from  prior marriage. 2 stepsons 27 and 29. .      Menopause 10/2010, uses estrogen patch with progesterone.  Exercises - 3 days a week, treadmill 3 miles and floor stretches.  Also cared for a friend's high school junior and senior year for two years before he went to college. Then her niece lived with them for a year.  3 years now on their own just her and hubby.         07/19/2015: marriage is going fine 27 years. Has 5 boys; 3 biological sons and 2 stepsons.  oldest is 62 with paroxysmal atrial tachycardia. He has a 65 month old grandson and just split with the mother.  Other sons are doing fine.  Not exercising much.  Walking with granchildren.          07/14/2017 exercises on treadmill up to 4 times a week; kids and marriage going well. 6 grandchildren.        The patient's past medical, family, and social history was reviewed and confirmed.    ROS: 10 point ROS was obtained and is negative except as mentioned in the HPI.      Physical exam:   General Appearance: alert, no distress, pleasant affect, cooperative.  Eyes: conjunctivae and corneas clear. sclerae normal.  Mouth: normal.  Heart: normal rate and regular rhythm.  Lungs: clear to auscultation.  Abdomen: BS normal. Abdomen soft, non-tender.  Extremities:  no edema.       Labs/imaging:   Lab Results   Lab Name Value Date/Time    WBC 4.9 05/11/2018 07:45 AM    WBC 4.8 06/28/2015 07:59 AM    HGB 13.7 05/11/2018 07:45 AM    HGB 14.1 06/28/2015 07:59 AM    HCT 41.1 05/11/2018 07:45 AM    HCT 41.1 06/28/2015 07:59 AM    PLT 223 05/11/2018 07:45 AM    PLT 219 06/28/2015 07:59 AM                 Impression/ plan:   Marisa Jenkins is a 63yr old female  who presents for colonoscopy for previous intramucosal adenocarcinoma of the rectum.      Pre-Sedation/Anesthesia Assessment:    Patient is a candidate for moderate sedation.   Patient is ASA status: 1 - Normal healthy patient    Airway Assessment:    Normal  ROM normal    Per pt ok to discuss bx/procedure results with family  and/or leave message on voicemail.    The procedure, risks, benefits, and alternatives were explained.  All patient questions were answered.  The informed consent was signed, and will be scanned into the computer database at a later date.     Patient barriers to learning: none     Patient/family understanding: verbalizes       Jones Skene, MD  Assistant Professor   Division of Gastroenterology/Hepatology  Pager: [phone]  [place] #: [phone]

## 2019-02-10 NOTE — Nursing Note (Signed)
NURSING PROCEDURE NOTE    Note started: 02/10/2019, 10:31 AM    Patient WAS wearing a surgical mask  Droplet precautions were followed when caring for the patient.   PPE used by provider during encounter: Surgical mask, Face Shield/Goggles, Gown and Gloves     Procedure pause done and plan of sedation reviewed. Sedation administered as ordered. S/P Colonoscopy with polypectomy. Patient tolerated procedure well. Refer to MD notes and nursing flowsheet for details. Eye glasses placed back on. To RR. Report was given to [place], Therapist, sports.    Leo Grosser, RN

## 2019-02-10 NOTE — Procedures (Signed)
Patient Name:  Marisa Jenkins  MRN: [id number]   DOB: Dec 16, 1955  DOS: 02/10/2019    Procedure: Colonoscopy  Subprocedure: Cold Forceps Polypectomy x 2     Referring Physician: [name], MD    PERFORMING SURGEON: Jones Skene, MD  ASSISTANT SURGEON: None     Indication: History of intramucosal adenocarcinoma in a rectal polyp s/p polypectomy in 2016 with subsequent exams showing no residual lesion.     Sedation: Versed 5 mg IV, Fentanyl 100 mcg IV and Benadryl 25 mg IV    Sedation Start Time: 10:08    I was the responsible provider supervising the administration of moderate sedation to this patient by a trained independent provider Investment banker, corporate). I was present during the interservice time from initial administration of sedatives until the end of the procedure at the time noted above.    Procedure Time:  Time start:  10:23    Time end: 10:40  Cecal Time: 10:25     Details of the Procedure:    Informed consent was obtained for the procedure, including sedation.  Risks of perforation, hemorrhage, adverse drug reaction, pain, infection, aspiration, missed lesion or diagnosis, and cardiopulmonary arrest were discussed.  The patient was placed in the left lateral decubitus position. The patient was monitored continuously with EKG tracing, pulse oximetry, blood pressure monitoring, and direct observations.      A rectal examination was performed.  The Olympus PCF-H190 colonoscope was inserted into the rectum and advanced under direct vision to the cecum, which was identified by the ileocecal valve and appendiceal orifice.  The quality of the colonic preparation was [facility] Bowel Prep Score of 3/3/3.  A careful inspection was made as the colonscope was withdrawn, including a retroflexed view of the rectum; findings and interventions are described below.  Photo documentation was obtained.  The patient tolerated the procedure well, and there were no complications.  She was taken to the recovery area in stable condition.       Findings and Interventions:    Rectal Exam: Normal sphincter tone. No external lesions.   Cecum: Normal   Ascending Colon: Normal   Transverse Colon: Normal   Descending Colon: 1 mm polyp removed with cold forceps and sent to pathology. Remaining portions of the mucosa appeared normal.   Sigmoid Colon:  1 mm polyp removed with cold forceps and sent to pathology. Remaining portions of the mucosa appeared normal.   Rectum: Normal appearing mucosa. No evidence of suspicious lesion or residual polyp. Multiple passes of the rectum were performed.   Retroflexion: Normal     Impression:  1. Two diminutive colon polyps removed as above.     Recommendation/Plan:  - Await pathology results  - Repeat colonoscopy to be determined based on the results of the biopsies.       Report Electronically Signed By:     Jones Skene, MD  Assistant Professor   Division of Gastroenterology/Hepatology  Pager: [phone]  [place] #: [phone]

## 2019-02-10 NOTE — Patient Instructions (Addendum)
Discharge Instructions for colonoscopy    Findings:  - Two small polyps were removed    Activity:    Rest today, resume usual activitiy tomorrow     Diet:    Resume usual diet:     Medication:    Resume all usual medications    Follow up:   We will notify you of your biopsy results via Mychart, phone call or letter.If you do not receive notice of your results by three weeks, please call us at the phone number provided below.      During your procedure, air was pumped into your GI tract so your doctor could see clearly to make a diagnosis and/or treat your problem.     Some possible side effects you may experience are:   - Discomfort due to a distended (bloated) abdomen which will subside after a few hours to two days.   - Nausea may be a side effect of the medication and will subside.   - The medications you received may make you dizzy and sleepy, it is important that you do not drive, operate machinery or drink alcohol for at least one day.   - Severe pain is not expected and should be reported    Other side effects may include:  Flex. Sig. Or Colonoscopy: A small amount of diarrhea may follow the exam.    If problems, call [facility] at [phone] during business hours Monday through Friday 7:30am to 4:30 pm.  After hours call [phone] and ask to speak to the GI Fellow on call.          Learning About Gastric Polyps  What are gastric polyps?  Gastric polyps are growths in the stomach. Most people who get them don't have any problems.  The cause of most gastric polyps is not known. But some polyps grow in people who use stomach acid reducers called proton pump inhibitors (PPIs). People who have gastritis, ulcers, or an H. pylori infection in the stomach can sometimes get polyps. People who have a parent, brother, or sister with gastric polyps might have them too.  Most gastric polyps aren't cancer. But a certain kind of polyp can turn into cancer.  What are the symptoms?  You may not know that you  have gastric polyps. Most polyps don't lead to symptoms. Once in a while, larger polyps may cause bleeding, belly pain, or a blockage in the stomach.  How are polyps diagnosed and treated?  Most gastric polyps are found during an endoscopy (say "en-DAW-skuh-pee") that is done for another health problem. Endoscopy is a test that uses a thin flexible tube to allow a doctor to look at the inside of your stomach.  Your doctor will treat your polyps based on what he or she sees during this test. If your doctor finds a polyp during the test, he or she may take it out. It will then be tested to make sure it isn't cancer.  If your doctor finds H. pylori bacteria in your stomach, he or she will prescribe antibiotics. If you have been taking a PPI, the doctor may prescribe a different medicine instead.  Sometimes the doctor may suggest another endoscopy to see how treatment is working. He or she may also suggest this to recheck certain kinds of polyps. The doctor may also suggest a colonoscopy to look for polyps in your colon.  Follow-up care is a key part of your treatment and safety. Be sure to make  and go to all appointments, and call your doctor if you are having problems. It's also a good idea to know your test results and keep a list of the medicines you take.  Where can you learn more?  Go to https://www.bennett.info/.  Enter [phone] in the search box to learn more about "Learning About Gastric Polyps."  Current as of: Jul 20, 2015  Content Version: 11.4   2006-2017 [employer]. Care instructions adapted under license by your healthcare professional. If you have questions about a medical condition or this instruction, always ask your healthcare professional. [place] any warranty or liability for your use of this information.

## 2019-02-10 NOTE — Nursing Note (Signed)
Patient WAS wearing a surgical mask.  Contact & Droplet precautions were followed when caring for the patient.  PPE used by provider during encounter: Surgical mask and face shield/goggles/gloves.    Amount of time spent educating patient: 30 minutes    Education                 Title: Endoscopy (Resolved)     Topic: Overview (Resolved)     Point: Indications (Resolved)     Description:   Indications for a GI endoscopy may include:       biopsy    difficulty swallowing    gastric or esophageal ulcer    GI bleeding    unexplained anemia    persistent heartburn    persistent vomiting    persistent reflux symptoms despite therapy    cancer screening    abnormal radiologic finding    foreign body removal    diagnostic for inflammatory bowel disease    Focus education on patient-specific indication.           Patient Friendly Description:   There are many reasons to have this procedure.       The doctor will talk to you about why it is important for you.    Please ask questions if you do not understand.    Learning Progress Summary           Patient Acceptance, E, VU by SP at 02/10/2019 1045    Acceptance, E, VU by FT at 02/10/2019 0910                   Point: Procedure Review (Resolved)     Description:   Gastrointestinal (GI) endoscopy is a procedure to examine the lining of the esophagus, stomach, upper small intestine, colon or rectum.       A flexible, lighted fiber optic scope is used.    Review anticipated anesthesia type.    Post-procedure care will include:    monitoring during sedation recovery    IV fluid    gradual diet advancement           Patient Friendly Description:   A gastrointestinal (GI) endoscopy is a long, flexible tube.       It looks at the lining of parts of your digestive system.    It is put down the throat or in your bottom.    A light and a tiny camera is at the end of the scope. Other special tools can be used through a hole in the scope.    Medicine will help make you sleepy and  comfortable.    Learning Progress Summary           Patient Acceptance, E, VU by SP at 02/10/2019 1045    Acceptance, E, VU by FT at 02/10/2019 0910                         Topic: Self-Management (Resolved)     Point: Activity (Resolved)     Description:   Review post-discharge activity restrictions which may include:       driving    work/school    Someone should be with the patient to make sure he/she is safe until fully recovered from sedation.           Patient Friendly Description:   Follow the doctor's instructions for activity.       Rest for today. Do not try to do  too much.    There may be limits set for:    work, school    playing sports, exercise    driving as applicable    use of tools or appliances    signing of legal papers    Someone should stay with you until you are safe or have completely recovered from sedation.    Learning Progress Summary           Patient Acceptance, E, VU by SP at 02/10/2019 1045                   Point: Fluid/Food Intake (Resolved)     Description:   Encourage advancing diet gradually once swallowing has returned to normal and patient is alert.              Patient Friendly Description:   After the procedure, your throat might be numb. When you can take small sips of water without coughing, you may start eating and drinking.       Learning Progress Summary           Patient Acceptance, E, VU by SP at 02/10/2019 1045                   Point: [place] Medication (Resolved)     Description:   Resume regular medications as directed by provider after procedure.       Patient may need to hold anticoagulants, sedatives and narcotics for a specified amount of time.           Patient Friendly Description:   The doctor will tell you when it is safe to take home medicines.       Depending on the procedure, you may be asked to wait to take some medicines again.    This might include:    blood thinners    pain medicine    medicines that help you relax    Learning Progress Summary            Patient Acceptance, E, VU by SP at 02/10/2019 1045                   Point: Promote Flatus Passing (Resolved)     Description:   Encourage gas expulsion and alternating positions to facilitate.              Patient Friendly Description:   Air was used to help see better during the procedure.       Extra air can give your child/you stomach pain after the procedure.    It will be important to pass gas and burp the extra air out. Walking and changing position helps move air up or down.    Right side-lying and fetal position (knees to the chest) might help with comfort.    Learning Progress Summary           Patient Acceptance, E, VU by SP at 02/10/2019 1045                         Topic: When to Seek Medical Attention (Resolved)     Point: Nausea and Vomiting (Resolved)     Description:   Provider (specify) should be called if:       unable to tolerate food, drink or medication    loss of appetite    symptoms do not resolve    Immediate medical care should be sought if:  forceful vomiting    blood in stool or vomit    symptoms become worse           Patient Friendly Description:   Do not force yourself to eat, but try to sip some clear liquids.       Call the doctor if:    you can't keep food or drink down without getting sick    you don't have an appetite    you do not feel better    Get medical care right away if:    you see blood in your poop (stool) or vomit    you feel worse    Learning Progress Summary           Patient Acceptance, E, VU by SP at 02/10/2019 1045                   Point: Sleepiness, Persistent (Resolved)     Description:   Provider (specify) should be called if:       continued sedation effects the next day    symptoms do not resolve    Immediate medical care should be sought if:    difficult to arouse/change in level of consciousness    symptoms become worse           Patient Friendly Description:   Call the doctor if:       you have a hard time waking up the next day    symptoms do not get  better    Learning Progress Summary           Patient Acceptance, E, VU by SP at 02/10/2019 1045                               User Key     Initials Effective Dates Name Provider Type Discipline    SP 01/11/18 Posey Pronto, Ardyth Man, RN .NURSE: (RN or LVN) nurse    FT 01/11/18 -  Tio, Fe B, RN .NURSE: (RN or LVN) nurse

## 2019-02-10 NOTE — Nursing Note (Signed)
Patient WAS wearing a surgical mask  Droplet precautions were followed when caring for the patient.   PPE used by provider during encounter: Surgical mask, Face Shield/Goggles and Gloves     Nursing Pre-Procedure Assessment    Marisa Jenkins arrived by ambulating  into clinic today at 847 from home.      Patient has arranged for a ride home from husband [name] who can be contacted at [phone]. Patient agrees to having individual providing ride home present while the post procedure instructions and results are given.Instructed patient that post sedation they are not to drive, drink alcohol, sign legal documents, or operate machinery for the remainder of the day    Verified procedure with the patient scheduled today for: Colonoscopy.    Outpatient Medications Marked as Taking for the 02/10/19 encounter (Office Visit) with Cecille Amsterdam, MD   Medication Sig Dispense Refill    DiphenhydrAMINE (BENADRYL) 50 mg/mL Injection 12.5-25 mg IV every 3 min PRN (NTE 50 mg); Give 25mg  PRN inadequate sedation after Midazolam given or as directed by MD 2 mL 0    Fentanyl (SUBLIMAZE) 50 mcg/mL Injection 12.5-50 mcg IV every 3 min PRN pain (NTE 200 mcg); Give 25 mcg until patient sedated but arousable or as directed by MD 4 mL 0    Midazolam (VERSED) 1 mg/mL Injection 0.5-2 mg IV every 3 min PRN sedation (NTE 10 mg); Give 1 mg until patient sedated but arousable or as directed by MD 10 mL 0    NaCl 0.9% Infusion IV at [place] or as directed by MD;  May use another 500 mL bag as needed. 1000 mL 0     Anticoagulants: Patient denies taking Aspirin, NSAIDs, or other anticoagulants for the past 5 days.  OTC/Herbal preparations: no    Reviewed with the patient her health status in regards to allergies (including latex), medications, pregnancy, hypertension, atrial fibrilation, liver disease, bleeding disorders, diabetes,  CVA's, seizures, sleep apnea, glaucoma, cancer, prosthesis or implants, infectious diseases, surgical history;  and disease of heart, lungs, liver, or kidneys. Positive history reviewed and updated in Health History section of the EMR.    Patient Active Problem List   Diagnosis    Hyperlipidemia    Postmenopausal HRT (hormone replacement therapy)    Migraine headache    Allergic rhinitis    Anxiety disorder    Impaired fasting glucose    Pain in joint of right hand    Internal hemorrhoids    Seasonal allergic rhinitis due to pollen    Cerumen impaction    Injury of right foot    Libido, decreased    Pain, abdominal    Tubular adenoma    Polyp of colon    Elevated liver enzymes    Rectal cancer (HCC)    Multifocal PVCs with pairing    Allergic conjunctivitis of both eyes    Dysfunction of left eustachian tube    Palpitations    Seborrheic keratosis, inflamed    Right kidney stone    Benign essential hypertension    Elevated glucose    Prediabetes    Osteopenia of lumbar spine    Vitamin D deficiency    Chronic allergic conjunctivitis    H/O colonoscopy with polypectomy    Residual hemorrhoidal skin tags    Serrated adenoma of colon     Allergies   Allergen Reactions    Clindamycin Other-Reaction in Comments     heartburn    Penicillin G Hives  About 2010     Past Medical History:   Diagnosis Date    Allergic conjunctivitis of both eyes 06/27/2016    Allergic rhinitis 04/29/2012    Anxiety disorder 04/29/2012    Benign essential hypertension 07/14/2017    Dysfunction of left eustachian tube 09/23/2016    Elevated liver enzymes     Hyperlipidemia 04/29/2012    Impaired fasting glucose     Internal hemorrhoids 02/27/2006    Menorrhagia     Migraine headache 04/29/2012    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    on holter monitor; some quadrigeminy    NAFLD (nonalcoholic fatty liver disease) 03/06/2017    seen on abdominal US    Pain in joint of right hand 2009    thumb joint    Postmenopausal HRT (hormone replacement therapy) 04/29/2012    Prediabetes 08/19/2017    Rectal cancer ([place])  11/2014    found in rectal polyp, removed during colonoscopy    Right kidney stone 03/06/2017    seen on abdominal US, 5 mm nonobstructing    Sciatica 2006    Serrated adenoma of colon 12/28/2018    Thrombocytopenia ([place]) 06/11/2006    Tubular adenoma 08/23/2014     Past Surgical History:   Procedure Laterality Date    BIOPSY, BREAST  2008    benign    BIOPSY, BREAST Left 10/2009    benign    COLONOSCOPY  11/24/2014    large pedunculated polyp    COLONOSCOPY  11/24/2014    COLONOSCOPY  06/22/2015    serrated tubular adenoma; no recurrence of rectal cancer hyperplastic polyp, repeat in 3 years    COLONOSCOPY  02/10/2019    ESOPHAGOGASTRODUODENOSCOPY (EGD)  11/24/2014    small antral ulcerations    FLEXIBLE SIGMOIDOSCOPY  03/16/2015    HC UPPER GI ENDOSCOPY,BIOPSY  11/24/2014    PR COLSC FLX W/RMVL OF TUMOR POLYP LESION SNARE TQ  04/22/2011    tubular adenoma: recheck 05/2014    PR COLSC FLX W/RMVL OF TUMOR POLYP LESION SNARE TQ  05/18/06    tubular adenoma    PR LAP PROCEDURE, UNLISTED, BLADDER  2003    bladder repair    PR REMOVAL OF LEG VEINS/ULCER  02/2003    vein stripping  Dr. Vira Agar    PR SIGMOIDOSCOPY,BIOPSY  03/16/2015    TONSILLECTOMY      TONSILLECTOMY AND ADENOIDECTOMY  1962     Social History     Tobacco Use   Smoking Status Former Smoker   Smokeless Tobacco Never Used   Tobacco Comment    quit at age 51     Social History     Substance and Sexual Activity   Drug Use No       Pre Procedure Checks:    Identaband on and two forms of identification information verified: yes  Pt completed bowel prep: GI Procedure  NPO since: 7am today clear liquid  Belongings are with patient under gurney.  Patient has: Jewelry/Rings/Watch.  Barriers to Learning assessed: none. Patient verbalizes understanding of teaching and instructions.    Past anesthesia responses:  No problem.  Nursing Assessment Data Base:  Ventilation   --  Respirations: normal, unlabored.  Circulation/Perfusion -- Skin: warm, normal  color and dry.  Cognition/Communication -- Behavior: alert and oriented and cooperative.  Gastro-Intestinal: no distress.  Abdomen: soft and non-tender.  Level of Ambulation: self.    Reviewed written post procedure instructions with the patient (What To Expect After Your Procedure).  Barriers to Learning assessed: none. Patient verbalizes understanding of teaching and instructions.  Nursing assessment completed by:   Marian Sorrow, RN, RN    Date: 02/10/2019 Time: 805

## 2019-02-11 ENCOUNTER — Telehealth: Payer: Self-pay | Admitting: GASTROENTEROLOGY

## 2019-02-11 LAB — SURGICAL PATHOLOGY

## 2019-02-11 NOTE — Telephone Encounter (Signed)
Post procedure follow up call: Spoke with pt. Verified 3 identifiers. Pt did not have questions or concerns following colonoscopy procedure on 02/10/19. Stated she feels great and everything went fine. Informed pt to call back if questions or concerns arise. Pt verbalized understanding.     Marisa Jenkins, LVN   [facility]

## 2019-02-14 ENCOUNTER — Encounter: Payer: Self-pay | Admitting: Family Medicine

## 2019-02-28 ENCOUNTER — Other Ambulatory Visit: Payer: Self-pay

## 2019-04-27 ENCOUNTER — Other Ambulatory Visit: Payer: Self-pay | Admitting: Family Medicine

## 2019-04-27 DIAGNOSIS — K279 Peptic ulcer, site unspecified, unspecified as acute or chronic, without hemorrhage or perforation: Secondary | ICD-10-CM

## 2019-04-28 ENCOUNTER — Other Ambulatory Visit: Payer: Self-pay

## 2019-04-28 NOTE — Telephone Encounter (Signed)
FamoTIDine (PEPCID) 40 mg Tablet  Was last filled on 02/03/18. Thane Edu LVN

## 2019-04-30 MED ORDER — FAMOTIDINE 40 MG TABLET
40.0000 mg | ORAL_TABLET | Freq: Every day | ORAL | 2 refills | Status: DC
Start: 2019-04-30 — End: 2020-02-14
  Filled 2019-04-30: qty 80, 80d supply, fill #0
  Filled 2019-05-02: qty 10, 10d supply, fill #0
  Filled 2019-07-26: qty 90, 90d supply, fill #1
  Filled 2019-09-18 – 2019-11-07 (×2): qty 90, 90d supply, fill #2

## 2019-05-02 ENCOUNTER — Other Ambulatory Visit: Payer: Self-pay

## 2019-05-03 ENCOUNTER — Other Ambulatory Visit: Payer: Self-pay

## 2019-05-30 ENCOUNTER — Other Ambulatory Visit: Payer: Self-pay

## 2019-06-01 ENCOUNTER — Other Ambulatory Visit: Payer: Self-pay

## 2019-06-07 ENCOUNTER — Other Ambulatory Visit: Payer: Self-pay

## 2019-06-17 ENCOUNTER — Other Ambulatory Visit: Payer: Self-pay

## 2019-06-17 ENCOUNTER — Ambulatory Visit: Payer: Commercial Managed Care - HMO | Attending: Family Medicine

## 2019-06-17 ENCOUNTER — Other Ambulatory Visit: Payer: Self-pay | Admitting: Family Medicine

## 2019-06-17 DIAGNOSIS — E785 Hyperlipidemia, unspecified: Secondary | ICD-10-CM

## 2019-06-17 DIAGNOSIS — R7303 Prediabetes: Secondary | ICD-10-CM | POA: Insufficient documentation

## 2019-06-17 DIAGNOSIS — G43009 Migraine without aura, not intractable, without status migrainosus: Secondary | ICD-10-CM

## 2019-06-17 DIAGNOSIS — Z0000 Encounter for general adult medical examination without abnormal findings: Secondary | ICD-10-CM | POA: Insufficient documentation

## 2019-06-17 LAB — LIPID PANEL WITH DLDL REFLEX
Cholesterol: 240 mg/dL — ABNORMAL HIGH (ref 0–200)
HDL Cholesterol: 66 mg/dL (ref >=35)
LDL Cholesterol Calculation: 146 mg/dL — ABNORMAL HIGH (ref <130)
Non-HDL Cholesterol: 174 mg/dL — ABNORMAL HIGH (ref <=150)
Total Cholesterol: HDL Ratio: 3.6 (ref <4.0)
Triglyceride: 138 mg/dL (ref 35–160)

## 2019-06-17 LAB — COMPREHENSIVE METABOLIC PANEL
Alanine Transferase (ALT): 44 U/L (ref 5–54)
Albumin: 4.6 g/dL (ref 3.2–4.6)
Alkaline Phosphatase (ALP): 94 U/L (ref 35–115)
Aspartate Transaminase (AST): 50 U/L — ABNORMAL HIGH (ref 15–43)
Bilirubin Total: 0.7 mg/dL (ref 0.3–1.3)
Calcium: 9.5 mg/dL (ref 8.6–10.5)
Carbon Dioxide Total: 25 mmol/L (ref 24–32)
Chloride: 101 mmol/L (ref 95–110)
Creatinine Serum: 0.64 mg/dL (ref 0.44–1.27)
E-GFR Creatinine (Female): 95 mL/min/{1.73_m2}
E-GFR, African American (Female): >100 mL/min/{1.73_m2}
Glucose: 107 mg/dL — ABNORMAL HIGH (ref 70–99)
Potassium: 4.2 mmol/L (ref 3.3–5.0)
Protein: 8 g/dL (ref 6.3–8.3)
Sodium: 138 mmol/L (ref 135–145)
Urea Nitrogen, Blood (BUN): 19 mg/dL (ref 8–22)

## 2019-06-17 LAB — CREATININE SPOT URINE: Creatinine Spot Urine: 265.11 mg/dL

## 2019-06-17 LAB — MICROALBUMIN
Creatinine Spot Urine: 265.11 mg/dL
Microalbumin Urine: 16.8 mg/dL
Microalbumin/Creatinine Ratio: 63 mg/g — ABNORMAL HIGH (ref <30)

## 2019-06-18 MED ORDER — BUTALBITAL-ASPIRIN-CAFFEINE 50 MG-325 MG-40 MG CAPSULE
1.0000 | ORAL_CAPSULE | Freq: Four times a day (QID) | ORAL | 0 refills | Status: DC | PRN
  Filled 2019-06-18: qty 25, 7d supply, fill #0

## 2019-06-19 LAB — HEMOGLOBIN A1C
Hgb A1C,Glucose Est Avg: 120 mg/dL
Hgb A1C: 5.8 % — ABNORMAL HIGH (ref 3.9–5.6)

## 2019-06-20 ENCOUNTER — Other Ambulatory Visit: Payer: Self-pay

## 2019-06-23 ENCOUNTER — Other Ambulatory Visit: Payer: Self-pay

## 2019-07-12 ENCOUNTER — Ambulatory Visit: Payer: Commercial Managed Care - HMO

## 2019-07-12 DIAGNOSIS — Z23 Encounter for immunization: Secondary | ICD-10-CM

## 2019-07-12 NOTE — Progress Notes (Signed)
Patient WAS wearing a surgical mask  Droplet precautions were followed when caring for the patient.   PPE used by provider during encounter: Surgical mask, Face Shield/Goggles and Gloves    The patient is eligible to receive the COVID-19 Vaccine at this time: Yes    The patient has received a COVID vaccine previously: No      The patient acknowledges receipt of the Fact Sheet for Recipients and Caregivers for the Pfizer-BioNtech vaccine, which includes information about the risks and benefits of the vaccine and available alternatives. The patient was informed the FDA has authorized the emergency use of the COVID-19 Vaccine, which is not an FDA-approved vaccine and that the patient may accept or refuse the vaccine.    Pre-Screening Documentation:          COVID-19 Immunization Allergies  1. Have you ever had a severe allergic reaction to a previous dose of the COVID-19 Vaccine or to any ingredient of the COVID-19 Vaccine? (Review ingredients in the Fact Sheet for Recipients and Caregivers): No  2. Have you ever had an immediate allergic reaction of any severity within 4 hours of receiving a previous dose of the COVID-19 Vaccine or any of its ingredients?: No  3. Have you ever had an immediate allergic reaction of any severity to polysorbate or polyethylene glycol [PEG]?: No  Did the patient answer yes to any of questions 1-3?: No (proceed to question 4)      COVID-19 Immunization Clinical Considerations  4.Are you pregnant or breastfeeding?: No  5.Do you have a weakened immune system caused by something such as HIV infection or cancer, or do you take medications that affect the immune system?: No  6. Do you have a bleeding disorder or are you taking blood thinners? : No  Did the patient answer yes to any of questions 4-6? : No (proceed to question 7)      COVID-19 Immunization Deferral Considerations  7. Have you received any other vaccines within the past 14 days? : No  8. Have you had a confirmed COVID-19  exposure in the past 14 days?: No  9. Have you tested positive for COVID-19 in the last 14 days? : No  10. Are you feeling sick today? : No  Did the patient answer yes to any of questions 7-10?: No (proceed to question 11)       COVID-19 Immunization Antibody Deferral Considerations  11. In the past 90 days, have you tested positive for COVID-19 and recieved treatment?: No (proceed to question 12)       COVID-19 Immunization Dermal Filler Consideration  12. Have you received a dermal filler injection?: No (proceed to question 13)      COVID-19 Immunization Observation Considerations  13. Do you have a history of immediate allergic reaction of any severity to a vaccine?: No  14. Do you have a history of immediate allergic reaction of any severity to an injectable medication?: No  15. Do you have a history of severe allergic reaction due to any cause? : No  Did the patient answer yes to any of questions 13-15? : No  If no, the patient was informed a 15-minute observation period is recommended after the vaccine to watch for a reaction.: Confirmed        All patient questions were answered and the patient agrees to receive the COVID-19 vaccine today.     Vaccine prepared and administered according to the current Emergency Use Authorization and Fact Sheet for Healthcare Providers   Administering Vaccine.    Patient given vaccine card and discharge instructions with information about the Fact Sheet for Recipients and Caregivers and the v-safe program. Patient directed to observation area.     Shyhiem Beeney, LVN

## 2019-07-26 ENCOUNTER — Other Ambulatory Visit: Payer: Self-pay

## 2019-08-02 ENCOUNTER — Ambulatory Visit: Payer: Commercial Managed Care - HMO | Attending: Family Medicine

## 2019-08-02 DIAGNOSIS — Z23 Encounter for immunization: Secondary | ICD-10-CM

## 2019-08-02 NOTE — Progress Notes (Signed)
Patient WAS wearing a surgical mask  Droplet precautions were followed when caring for the patient.   PPE used by provider during encounter: Surgical mask    The patient is eligible to receive the COVID-19 Vaccine at this time: Yes    The patient has received a COVID vaccine previously: Yes   Immunization History   Administered Date(s) Administered    COVID-19 mRNA PF 30 mcg/0.3 mL [employer])   07/12/2019            The patient acknowledges receipt of the Fact Sheet for Recipients and Caregivers for the Pfizer-BioNtech vaccine, which includes information about the risks and benefits of the vaccine and available alternatives. The patient was informed the FDA has authorized the emergency use of the COVID-19 Vaccine, which is not an FDA-approved vaccine and that the patient may accept or refuse the vaccine.    Pre-Screening Documentation:        [facility]y) Vaccine Disclosure  Is patient receiving the [facility] Emery) COVID-19 Vaccine?: No      COVID-19 Immunization Allergies  1. Have you ever had a severe allergic reaction to a previous dose of the COVID-19 Vaccine or to any ingredient of the COVID-19 Vaccine? (Review ingredients in the Fact Sheet for Recipients and Caregivers): No  2. Have you ever had an immediate allergic reaction of any severity within 4 hours of receiving a previous dose of the COVID-19 Vaccine or any of its ingredients?: No  3. Have you ever had an immediate allergic reaction of any severity to polysorbate or polyethylene glycol [PEG]?: No  Did the patient answer yes to any of questions 1-3?: No (proceed to question 4)      COVID-19 Immunization Clinical Considerations  4.Are you pregnant or breastfeeding?: No  5.Do you have a weakened immune system caused by something such as HIV infection or cancer, or do you take medications that affect the immune system?: No  6. Do you have a bleeding disorder or are you taking blood thinners? : No  Did the patient answer yes to  any of questions 4-6? : No (proceed to question 7)      COVID-19 Immunization Deferral Considerations  7. Have you had a confirmed COVID-19 exposure in the past 14 days?: No  8. Have you tested positive for COVID-19 in the last 14 days? : No  9. Are you feeling sick today? : No  Did the patient answer yes to any of questions 7-9?: No (proceed to question 10)       COVID-19 Immunization Antibody Deferral Considerations  10. In the past 90 days, have you tested positive for COVID-19 and recieved treatment?: No (proceed to question 11)       COVID-19 Immunization Dermal Filler Consideration  11. Have you received a dermal filler injection?: No (proceed to question 12)      COVID-19 Immunization Observation Considerations  12. Do you have a history of immediate allergic reaction of any severity to a vaccine?: No  13. Do you have a history of immediate allergic reaction of any severity to an injectable medication?: No  14. Do you have a history of severe allergic reaction due to any cause? : No  15. Have you ever fainted after receiving an injection?: No  Did the patient answer yes to any of questions 12-15? : No  If no, the patient was informed a 15-minute observation period is recommended after the vaccine to watch for a reaction.: Confirmed  All patient questions were answered and the patient agrees to receive the COVID-19 vaccine today.     Vaccine prepared and administered according to the current Emergency Use Authorization and Fact Sheet for Healthcare Providers Administering Vaccine.    Patient given vaccine card and discharge instructions with information about the Fact Sheet for Recipients and Caregivers and the v-safe program. Patient directed to observation area.     Shauna Bodkins, LVN

## 2019-08-05 ENCOUNTER — Other Ambulatory Visit: Payer: Self-pay

## 2019-08-09 ENCOUNTER — Other Ambulatory Visit: Payer: Self-pay

## 2019-09-09 ENCOUNTER — Other Ambulatory Visit: Payer: Self-pay

## 2019-09-09 ENCOUNTER — Other Ambulatory Visit: Payer: Self-pay | Admitting: Family Medicine

## 2019-09-09 DIAGNOSIS — E785 Hyperlipidemia, unspecified: Secondary | ICD-10-CM

## 2019-09-10 MED ORDER — ATORVASTATIN 20 MG TABLET
20.0000 mg | ORAL_TABLET | Freq: Every day | ORAL | 1 refills | Status: DC
  Filled 2019-09-10: qty 90, 90d supply, fill #0
  Filled 2019-12-13: qty 90, 90d supply, fill #1

## 2019-09-11 ENCOUNTER — Other Ambulatory Visit: Payer: Self-pay | Admitting: Family Medicine

## 2019-09-11 DIAGNOSIS — I10 Essential (primary) hypertension: Secondary | ICD-10-CM

## 2019-09-12 ENCOUNTER — Other Ambulatory Visit: Payer: Self-pay

## 2019-09-13 ENCOUNTER — Other Ambulatory Visit: Payer: Self-pay

## 2019-09-13 MED ORDER — METOPROLOL SUCCINATE ER 50 MG TABLET,EXTENDED RELEASE 24 HR
50.0000 mg | EXTENDED_RELEASE_TABLET | Freq: Every day | ORAL | 1 refills | Status: DC
  Filled 2019-09-13: qty 90, 90d supply, fill #0
  Filled 2019-12-13: qty 70, 70d supply, fill #1
  Filled 2019-12-13: qty 20, 20d supply, fill #1

## 2019-09-14 ENCOUNTER — Other Ambulatory Visit: Payer: Self-pay

## 2019-09-18 ENCOUNTER — Other Ambulatory Visit: Payer: Self-pay | Admitting: Family Medicine

## 2019-09-18 DIAGNOSIS — G43009 Migraine without aura, not intractable, without status migrainosus: Secondary | ICD-10-CM

## 2019-09-19 ENCOUNTER — Other Ambulatory Visit: Payer: Self-pay

## 2019-09-21 ENCOUNTER — Other Ambulatory Visit: Payer: Self-pay

## 2019-09-21 MED ORDER — BUTALBITAL-ASPIRIN-CAFFEINE 50 MG-325 MG-40 MG CAPSULE
1.0000 | ORAL_CAPSULE | Freq: Four times a day (QID) | ORAL | 0 refills | Status: DC | PRN
  Filled 2019-09-21: qty 25, 7d supply, fill #0

## 2019-09-22 ENCOUNTER — Other Ambulatory Visit: Payer: Self-pay

## 2019-11-07 ENCOUNTER — Other Ambulatory Visit: Payer: Self-pay

## 2019-11-16 ENCOUNTER — Other Ambulatory Visit: Payer: Self-pay

## 2019-12-07 ENCOUNTER — Other Ambulatory Visit: Payer: Self-pay | Admitting: Family Medicine

## 2019-12-07 DIAGNOSIS — Z1231 Encounter for screening mammogram for malignant neoplasm of breast: Secondary | ICD-10-CM

## 2019-12-13 ENCOUNTER — Other Ambulatory Visit: Payer: Self-pay

## 2019-12-19 ENCOUNTER — Other Ambulatory Visit: Payer: Self-pay

## 2019-12-29 ENCOUNTER — Other Ambulatory Visit: Payer: Self-pay

## 2019-12-29 ENCOUNTER — Other Ambulatory Visit: Payer: Self-pay | Admitting: Family Medicine

## 2019-12-29 DIAGNOSIS — G43009 Migraine without aura, not intractable, without status migrainosus: Secondary | ICD-10-CM

## 2019-12-30 NOTE — Telephone Encounter (Signed)
NEEDS APPT PRIOR TO FURTHER REFILLS.  Due for annual exam as well. [name], MD

## 2020-01-02 ENCOUNTER — Other Ambulatory Visit: Payer: Self-pay

## 2020-01-02 NOTE — Telephone Encounter (Signed)
Called patient no answer left voicemail to call back.  Operator: please relay MD's message and assist with scheduling. Marisa Fredenburg, MA I

## 2020-01-03 NOTE — Telephone Encounter (Signed)
Scheduled. Jowell Bossi, MA I

## 2020-01-04 ENCOUNTER — Ambulatory Visit: Payer: Commercial Managed Care - HMO | Admitting: Family Medicine

## 2020-01-04 ENCOUNTER — Other Ambulatory Visit: Payer: Self-pay

## 2020-01-04 VITALS — BP 130/77 | HR 74 | Temp 97.3°F | Resp 14 | Wt 137.8 lb

## 2020-01-04 DIAGNOSIS — M25531 Pain in right wrist: Secondary | ICD-10-CM

## 2020-01-04 DIAGNOSIS — G43009 Migraine without aura, not intractable, without status migrainosus: Secondary | ICD-10-CM

## 2020-01-04 MED ORDER — BUTALBITAL-ASPIRIN-CAFFEINE 50 MG-325 MG-40 MG CAPSULE
1.0000 | ORAL_CAPSULE | Freq: Four times a day (QID) | ORAL | 0 refills | Status: DC | PRN
  Filled 2020-01-04: qty 25, 7d supply, fill #0

## 2020-01-04 NOTE — Progress Notes (Signed)
SUBJECTIVE:    Marisa Jenkins is a 25yr female who presents with a chief complaint of med consult. New patient to me, PCP not available    Told to come for follow up due to med refill, fiorinal for migraine  Has migraines & uses for HA when coming on. Maybe about once per month.   Last refill 09/21/2019 #25    Now having right wrist pain & swelling. On & off for a year. Thought it would go away.   Avocado oil, used some ibuprofen & that helped a little but sore all day.         Patient WAS wearing a surgical mask  Droplet and Airborne precautions were followed when caring for the patient.   PPE used by provider during encounter: Surgical mask    OBJECTIVE:  BP 130/77 (SITE: right arm, Orthostatic Position: sitting, Cuff Size: regular)   Pulse 74   Temp 36.3 C (97.3 F) (Temporal)   Resp 14   Wt 62.5 kg (137 lb 12.6 oz)   LMP 10/28/2010   BMI 22.93 kg/m   Gen: Well appearing, no distress  No current headache  Right wrist with mild swelling. No deformity. Mild tender over thenar eminence    Reviewed:none    ASSESSMENT/PLAN:    ICD-10-CM    1. Migraine without aura and without status migrainosus, not intractable  G43.009 Butalbital-Aspirin-Caffeine (FIORINAL) 50-325-40 mg per capsule   2. Right wrist pain  M25.531      refill med   Counseled on use. Seems to use very infrequently, no concerns.    Right wrist pain. Advised to use a brace & PRN NSAIDs in needed.follow up with PCP at annual exam scheduled.    Updated vaccine list  Not due COVID # 3 until late November.    I did review patient's past medical and family/social history, no changes noted.  Barriers to Learning assessed: none. Patient verbalizes understanding of teaching and instructions.    Electronically signed by:  Maebelle Munroe. Nakoma Gotwalt, MD  Family [facility]  [facility]      If you are reviewing this progress note and have questions about the meaning or medical terms being used, please schedule an appointment or bring it up at  your next follow-up appointment. Medical notes are meant to be a communication tool between medical professionals and require medical terms to be used for efficiency. You can look up term via on-line medical dictionaries as well.

## 2020-01-04 NOTE — Nursing Note (Signed)
Patient ID confirmed using 2 identifiers.     Vital signs taken, allergies verified, screened for pain, and med history taken.      Patient WAS wearing a surgical mask  Contact precautions were followed when caring for the patient.   PPE used by provider during encounter: Surgical mask & Face Shield        Hadeel Hillebrand/ MA

## 2020-01-10 ENCOUNTER — Other Ambulatory Visit: Payer: Self-pay

## 2020-01-20 ENCOUNTER — Other Ambulatory Visit: Payer: Self-pay

## 2020-02-14 ENCOUNTER — Other Ambulatory Visit: Payer: Self-pay | Admitting: Family Medicine

## 2020-02-14 DIAGNOSIS — K279 Peptic ulcer, site unspecified, unspecified as acute or chronic, without hemorrhage or perforation: Secondary | ICD-10-CM

## 2020-02-15 ENCOUNTER — Encounter: Payer: Self-pay | Admitting: Family Medicine

## 2020-02-15 ENCOUNTER — Other Ambulatory Visit: Payer: Self-pay

## 2020-02-15 ENCOUNTER — Ambulatory Visit: Payer: Commercial Managed Care - HMO | Attending: Family Medicine | Admitting: Family Medicine

## 2020-02-15 VITALS — BP 163/77 | HR 87 | Temp 96.9°F | Ht 64.37 in | Wt 136.7 lb

## 2020-02-15 DIAGNOSIS — M19041 Primary osteoarthritis, right hand: Secondary | ICD-10-CM | POA: Insufficient documentation

## 2020-02-15 DIAGNOSIS — Z0000 Encounter for general adult medical examination without abnormal findings: Secondary | ICD-10-CM | POA: Insufficient documentation

## 2020-02-15 DIAGNOSIS — K760 Fatty (change of) liver, not elsewhere classified: Secondary | ICD-10-CM | POA: Insufficient documentation

## 2020-02-15 DIAGNOSIS — Z0001 Encounter for general adult medical examination with abnormal findings: Secondary | ICD-10-CM | POA: Insufficient documentation

## 2020-02-15 DIAGNOSIS — R7303 Prediabetes: Secondary | ICD-10-CM | POA: Insufficient documentation

## 2020-02-15 DIAGNOSIS — E785 Hyperlipidemia, unspecified: Secondary | ICD-10-CM | POA: Insufficient documentation

## 2020-02-15 DIAGNOSIS — F411 Generalized anxiety disorder: Secondary | ICD-10-CM | POA: Insufficient documentation

## 2020-02-15 MED ORDER — FAMOTIDINE 40 MG TABLET
40.0000 mg | ORAL_TABLET | Freq: Every day | ORAL | 0 refills | Status: DC
Start: 2020-02-15 — End: 2020-03-15
  Filled 2020-02-15: qty 30, 30d supply, fill #0

## 2020-02-15 MED ORDER — DIAZEPAM 5 MG TABLET
5.0000 mg | ORAL_TABLET | Freq: Every day | ORAL | 0 refills | Status: DC | PRN
  Filled 2020-02-15: qty 30, 30d supply, fill #0

## 2020-02-15 NOTE — Nursing Note (Signed)
Vital signs taken,tobacco,allergies,pharmacy verified, screened for pain.  Rolinda Impson, MA    Patient WAS wearing a surgical mask  Droplet precautions were followed when caring for the patient.   PPE used by provider during encounter: Surgical mask and Face Shield/Goggles

## 2020-02-15 NOTE — Progress Notes (Signed)
11Answers for HPI/ROS submitted by the patient on 02/14/2020  PHQ-2 Score:: 0    2:33 PM  02/15/2020   Patient WAS wearing a surgical mask  Contact, Droplet and Airborne precautions were followed when caring for the patient.   PPE used by provider during encounter: Surgical mask  Chief Complaint   Patient presents with    Physical        SUBJECTIVE: Marisa Jenkins is a 64yr old female who presents today for a routine prevention physical.   In general she has been feeling well and functioning well at home, work, and personal relationships. Has right hand pain at the base of the thumb.  Mouses on the right she is left handed though, no injuries.   OB/GYN HISTORY: Menstrual history :  Patient's last menstrual period was 10/28/2010.  Denies any postmenopausal bleeding      Last pap smear test was 2017 and it was  normal   Last mammogram was a year ago and it was normal.   Last bone density test/ dexa scan test was 2019 and was essentially normal.   Last colonoscopy was  and next colonoscopy is due   Last labs were 6 months ago.     Vaccines   Immunization History   Administered Date(s) Administered    COVID-19 mRNA PF 30 mcg/0.3 mL [employer]) 07/12/2019, 08/02/2019    Influenza Vaccine 12/29/2019    Influenza Vaccine, Quadrivalent (Fluarix/Flulaval/Fluzone Prefill Syringe) 12/15/2017, 12/23/2018    Influenza Vaccine, Quadrivalent (Flulaval) 12/04/2015, 12/30/2016    Influenza Vaccine, Quadrivalent (Fluzone/Fluarix) 01/04/2013, 12/29/2013, 12/12/2014    Td (Adult) Preservative Free 12/04/2005    Tdap 11/24/2008    Tdap (Boostrix) 03/11/2013, 12/28/2013    Zoster Vaccine (live) 07/19/2015    Zoster Vaccine (recombinant) 06/26/2016, 01/15/2017      Vaccines are up to date.      reports that she has quit smoking. She has never used smokeless tobacco. She reports current alcohol use. She reports that she does not use drugs.   She exercises regularly.    She is  performing periodic breast self examinations. She has not  noted any breast problems.     ROS:   Review of Systems   Constitutional: Negative for appetite change, fever and unexpected weight change.   HENT: Negative.  Negative for congestion and sore throat.    Eyes: Negative.  Negative for visual disturbance.   Respiratory: Negative.  Negative for cough and shortness of breath.    Cardiovascular: Negative.    Gastrointestinal: Negative for abdominal pain and blood in stool.   Endocrine: Negative for polydipsia and polyuria.   Genitourinary: Negative for dysuria, flank pain, hematuria and menstrual problem.   Musculoskeletal: Positive for arthralgias.        Right hand pain at the base of the thumb   Skin: Negative for color change and rash.   Allergic/Immunologic: Negative for environmental allergies.   Neurological: Negative.  Negative for syncope and headaches.   Hematological: Does not bruise/bleed easily.   Psychiatric/Behavioral: Negative for sleep disturbance. The patient is nervous/anxious.        OBJECTIVE:  BP 163/77 (SITE: left arm, Orthostatic Position: sitting, Cuff Size: regular)   Pulse 87   Temp 36.1 C (96.9 F) (Tympanic)   Ht 1.635 m (5' 4.37")   Wt 62 kg (136 lb 11 oz)   LMP 10/28/2010   BMI 23.19 kg/m    Patient's last menstrual period was 10/28/2010.    Physical Exam  General Appearance: healthy, alert, no distress, pleasant affect, cooperative.  Eyes:  conjunctivae and corneas clear. PERRL, EOM's intact. sclerae normal.  Ears:  normal TMs and canal and external inspection of ears show no abnormality.  Nose:  normal.  Mouth: normal.  Neck:  Neck supple. No adenopathy, thyroid symmetric, normal size.  Heart:  normal rate and regular rhythm, no murmurs, clicks, or gallops.  Lungs: clear to auscultation.  Breast:  inspection negative. No nipple discharge, bleeding or masses.  Abdomen: BS normal.  Abdomen soft, non-tender.  No masses or organomegaly.  Extremities:  no cyanosis, clubbing, or edema and distal pulses normal.  Skin:  Skin color,  texture, turgor normal. No rashes or lesions.  Pelvic:  External genitalia and vagina normal, bimanual and rectovaginal normal.  Rectal: negative.  Neuro: Gait normal. Reflexes normal and symmetric. Sensation and strength grossly normal.           ASSESSMENT:  (Z00.00) Routine general medical examination at a health care facility  (primary encounter diagnosis)  Comment:   Plan: Lipid Panel with DLDL Reflex, Cytology Gyn, HPV        DNA, Cytology Gyn, HPV DNA            (F41.1) Generalized anxiety disorder  Comment: Reviewed potential side effects and risks of Valium, she takes it sparingly, and no driving or alcohol use  Plan: Diazepam (VALIUM) 5 mg Tablet            (E78.5) Hyperlipidemia with target LDL less than 130  Comment:   Plan: Lipid Panel with DLDL Reflex, Comprehensive         Metabolic Panel            (K76.0) Fatty liver disease, nonalcoholic  Comment:   Plan: Comprehensive Metabolic Panel            (R73.03) Prediabetes  Comment:   Plan: Hemoglobin A1C, Comprehensive Metabolic Panel            (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 She was also counseled regarding screening for hyperlipidemia, healthy diet and exercise, routine immunizations, sun protection and routine examinations. A Gynecologic Cancers pamphlet was  provided to the patient. Counselling in regards to timing for colon cancer screening and types of available screening discussed. Self care measures discussed include skin care, dental hygiene and cleanings, seatbelt and helmet use.     Advised annual follow up exam.  I reviewed the patient's past medical and family/social history, and updated problem list  .  Barriers to Learning assessed: none. Patient verbalizes understanding of teaching and instructions.  [name], MD  Family [facility], [facility]  [facility]   This document has been created using Dragon Naturally Speaking and has been checked for content. Some voice recognition errors may have been missed inadvertantly.Please note any grammatical, sound-alike, or syntax errors as likely dictation errors.     [name] If you are reviewing this progress note and have questions about the meaning or medical terms being used, please schedule an appointment or bring it up at your next follow-up appointment.  Medical notes are meant to be a communication tool between medical  professionals and require medical terms to be used for efficiency. You can also look up terms via on-line medical dictionaries such as Medline Plus at   [email or web address].org/.html   Electronically signed by:  [name], MD  Family Practice Department/ [facility]

## 2020-02-16 ENCOUNTER — Ambulatory Visit: Payer: Commercial Managed Care - HMO

## 2020-02-16 DIAGNOSIS — M19041 Primary osteoarthritis, right hand: Secondary | ICD-10-CM | POA: Insufficient documentation

## 2020-02-18 LAB — HPV DNA
HPV HIGH RISK GROUP: NEGATIVE
HPV TYPE 16: NEGATIVE
HPV TYPE 18: NEGATIVE

## 2020-02-20 LAB — CYTOLOGY GYN: INTERPRETATION: NEGATIVE

## 2020-02-23 ENCOUNTER — Other Ambulatory Visit: Payer: Self-pay

## 2020-02-23 ENCOUNTER — Ambulatory Visit (INDEPENDENT_AMBULATORY_CARE_PROVIDER_SITE_OTHER): Payer: Commercial Managed Care - HMO

## 2020-02-23 ENCOUNTER — Ambulatory Visit: Payer: Commercial Managed Care - HMO | Attending: Family Medicine | Admitting: Family Medicine

## 2020-02-23 DIAGNOSIS — J0100 Acute maxillary sinusitis, unspecified: Secondary | ICD-10-CM

## 2020-02-23 DIAGNOSIS — R059 Cough, unspecified: Secondary | ICD-10-CM

## 2020-02-23 DIAGNOSIS — Z20828 Contact with and (suspected) exposure to other viral communicable diseases: Secondary | ICD-10-CM

## 2020-02-23 DIAGNOSIS — Z20822 Contact with and (suspected) exposure to covid-19: Secondary | ICD-10-CM

## 2020-02-23 MED ORDER — DOXYCYCLINE HYCLATE 100 MG TABLET
100.0000 mg | ORAL_TABLET | Freq: Two times a day (BID) | ORAL | 0 refills | Status: AC
Start: 2020-02-23 — End: 2020-02-28
  Filled 2020-02-23: qty 10, 5d supply, fill #0

## 2020-02-23 NOTE — Patient Instructions (Signed)
INSTRUCTIONS FROM YOUR DOCTOR:   Ibuprofen 600-800mg  by mouth every 8 hours (max 2400mg  in 24 hours).  Take ibuprofen with food to protect gastrointestinal lining.    Acetaminophen 1000mg  by mouth every 8 hours  (max 4000mg  in 24 hours; if you have liver disease, then max 2000mg  in 24 hours).             Coronavirus ([id number]): Care Instructions  Overview  The coronavirus disease (COVID-19) is caused by a virus. It causes a fever, a cough, and shortness of breath. It mainly spreads person-to-person through droplets from coughing and sneezing. The virus also can spread when people are in close contact with someone who is infected.  Most people have mild symptoms and can take care of themselves at home. If their symptoms get worse, they may need care in a hospital. There is no medicine to fight the virus.  It's important to not spread the virus to others. If you have COVID-19, wear a face cover anytime you are around other people. You need to isolate yourself while you are sick. Your doctor will tell you when you no longer need to be isolated. Leave your home only if you need to get medical care.  Follow-up care is a key part of your treatment and safety. Be sure to make and go to all appointments, and call your doctor if you are having problems. It's also a good idea to know your test results and keep a list of the medicines you take.  How can you care for yourself at home?   Get extra rest. It can help you feel better.   Drink plenty of fluids. This helps replace fluids lost from fever. Fluids also help ease a scratchy throat. Water, soup, fruit juice, and hot tea with lemon are good choices.   Take acetaminophen (such as Tylenol) to reduce a fever. It may also help with muscle aches. Read and follow all instructions on the label.   Sponge your body with lukewarm water to help with fever. Don't use cold water or ice.   Use petroleum jelly on sore skin. This can help if the skin around your nose and lips becomes  sore from rubbing a lot with tissues.  Tips for isolation   Wear a cloth face cover when you are around other people. It can help stop the spread of the virus when you cough or sneeze.   Limit contact with people in your home. If possible, stay in a separate bedroom and use a separate bathroom.   Avoid contact with pets and other animals.   Cover your mouth and nose with a tissue when you cough or sneeze. Then throw it in the trash right away.   Wash your hands often, especially after you cough or sneeze. Use soap and water, and scrub for at least 20 seconds. If soap and water aren't available, use an alcohol-based hand sanitizer.   Don't share personal household items. These include bedding, towels, cups and glasses, and eating utensils.   Clean and disinfect your home every day. Use household cleaners and disinfectant wipes or sprays. Take special care to clean things that you grab with your hands. These include doorknobs, remote controls, phones, and handles on your refrigerator and microwave. And don't forget countertops, tabletops, bathrooms, and computer keyboards.  When should you call for help?   [id number] anytime you think you may need emergency care. For example, call if you have life-threatening symptoms, such as:   You have  severe trouble breathing. (You can't talk at all.)   You have constant chest pain or pressure.   You are severely dizzy or lightheaded.   You are confused or can't think clearly.   Your face and lips have a blue color.   You pass out (lose consciousness) or are very hard to wake up.  Call your doctor now or seek immediate medical care if:   You have moderate trouble breathing. (You can't speak a full sentence.)   You are coughing up blood (more than about 1 teaspoon).   You have signs of low blood pressure. These include feeling lightheaded; being too weak to stand; and having cold, pale, clammy skin.  Watch closely for changes in your health, and be sure to contact your  doctor if:   Your symptoms get worse.   You are not getting better as expected.  Call before you go to the doctor's office.  Follow their instructions. And wear a cloth face cover.  Current as of: July 02, 2018               Content Version: 12.4   2006-2020 Healthwise, [employer]orporated.   Care instructions adapted under license by your healthcare professional. If you have questions about a medical condition or this instruction, always ask your healthcare professional. [place] any warranty or liability for your use of this information.         Learning About Coronavirus (COVID-19)  Coronavirus (COVID-19): Overview  What is coronavirus (COVID-19)?  The coronavirus disease (COVID-19) is caused by a virus. It is an illness that was first found in [place], [place], in December 2019. It has since spread worldwide.  The virus can cause fever, cough, and trouble breathing. In severe cases, it can cause pneumonia and make it hard to breathe without help. It can cause death.  Coronaviruses are a large group of viruses. They cause the common cold. They also cause more serious illnesses like Middle East respiratory syndrome (MERS) and severe acute respiratory syndrome (SARS). COVID-19 is caused by a novel coronavirus. That means it's a new type that has not been seen in people before.  This virus spreads person-to-person through droplets from coughing and sneezing. It can also spread when you are close to someone who is infected. And it can spread when you touch something that has the virus on it, such as a doorknob or a tabletop.  What can you do to protect yourself from coronavirus (COVID-19)?  The best way to protect yourself from getting sick is to:   Avoid areas where there is an outbreak.   Avoid contact with people who may be infected.   Wash your hands often with soap or alcohol-based hand sanitizers.   Avoid crowds and try to stay at least 6 feet away from other people.   Wash your hands  often, especially after you cough or sneeze. Use soap and water, and scrub for at least 20 seconds. If soap and water aren't available, use an alcohol-based hand sanitizer.   Avoid touching your mouth, nose, and eyes.  What can you do to avoid spreading the virus to others?  To help avoid spreading the virus to others:   Cover your mouth with a tissue when you cough or sneeze. Then throw the tissue in the trash.   Use a disinfectant to clean things that you touch often.   Wear a cloth face cover if you have to go to public areas.   Stay home  if you are sick or have been exposed to the virus. Don't go to school, work, or public areas. And don't use public transportation.   If you are sick:  ? Leave your home only if you need to get medical care. But call the doctor's office first so they know you're coming. And wear a face cover.  ? Wear the face cover whenever you're around other people. It can help stop the spread of the virus when you cough or sneeze.  ? Clean and disinfect your home every day. Use household cleaners and disinfectant wipes or sprays. Take special care to clean things that you grab with your hands. These include doorknobs, remote controls, phones, and handles on your refrigerator and microwave. And don't forget countertops, tabletops, bathrooms, and computer keyboards.  When to call for help  Call911 anytime you think you may need emergency care. For example, call if:   You have severe trouble breathing. (You can't talk at all.)   You have constant chest pain or pressure.   You are severely dizzy or lightheaded.   You are confused or can't think clearly.   Your face and lips have a blue color.   You pass out (lose consciousness) or are very hard to wake up.  Call your doctor now if you develop symptoms such as:   Shortness of breath.   Fever.   Cough.  If you need to get care, call ahead to the doctor's office for instructions before you go. Make sure you wear a face cover to prevent  exposing other people to the virus.  Where can you get the latest information?  The following health organizations are tracking and studying this virus. Their websites contain the most up-to-date information. You'll also learn what to do if you think you may have been exposed to the virus.   U.S. Centers for Disease Control and Prevention (CDC): The CDC provides updated news about the disease and travel advice. The website also tells you how to prevent the spread of infection. http://www.wolf.info/   World Health Organization [facility]): WHO offers information about the virus outbreaks. WHO also has travel advice. RoleLink.com.br  Current as of: July 02, 2018               Content Version: 12.4   2006-2020 Healthwise, Incorporated.   Care instructions adapted under license by your healthcare professional. If you have questions about a medical condition or this instruction, always ask your healthcare professional. [place] any warranty or liability for your use of this information.

## 2020-02-23 NOTE — Progress Notes (Signed)
Video Visit Note     I performed this clinical encounter by utilizing a real time telehealth video connection between my location and the patient's location. The patient's location was confirmed during this visit. I obtained verbal consent from the patient to perform this clinical encounter utilizing video and prepared the patient by answering any questions they had about the telehealth interaction.    Chief Complaint   Patient presents with    Viral Syndrome         History of Present Illness:  Marisa Jenkins is a 64yr old female who presents for chief complaint(s) of feeling sick since 02/20/2020, including headaches, sinus congestion (under eyes) associated with mild tooth pain, green mucous discharge, cough -- coughed all night last night -- productive of yellow/green phlegm. No shortness of breath. She had sore throat for a couple of days, and today just a bit scratchy. She had Tmax 100F yesterday, no chills; today feels normal. No body aches, but very fatigued. She has had nausea/vomiting, but attributes this to significant phlegm. No abdominal pain, no diarrhea. No loss of smell, but nothing tastes good.    No known COVID-19 exposures.    ROS:  Constitutional: fatigue, malaise, fever.  Ears, Nose, Mouth, Throat: sinus congestion, tooth pain.  CV: negative.  Resp: cough.    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    Elevated liver enzymes     Polyp of colon 11/24/2014     Rectal cancer ([place]) 11/09/2014    Pain, abdominal 08/23/2014    Tubular adenoma 08/23/2014    Cerumen impaction 11/04/2013    Injury of right foot 11/04/2013    Libido, decreased 11/04/2013    Seasonal allergic rhinitis due to pollen 05/27/2012    Impaired fasting glucose     Pain in joint of right hand     Hyperlipidemia 04/29/2012    Postmenopausal HRT (hormone replacement therapy) 04/29/2012    Migraine headache 04/29/2012    Allergic rhinitis 04/29/2012    Anxiety disorder 04/29/2012    H/O colonoscopy with polypectomy 04/22/2011    Chronic allergic conjunctivitis 05/24/2007    Internal hemorrhoids 02/27/2006    Residual hemorrhoidal skin tags 02/27/2006       Current Outpatient Medications   Medication Sig    Atorvastatin (LIPITOR) 20 mg Tablet Take 1 tablet by mouth every day.    Butalbital-Aspirin-Caffeine [facility]) 50-325-40 mg per capsule Take 1 capsule by mouth 4 times daily if needed. Not to exceed 4 capsules in 24 hours.  Avoid taking within 8 hours of taking valium, may cause palpitations    Cetirizine (ZYRTEC) 10 mg Tablet Take 1 tablet by mouth once daily if needed for Allergies.    Cyanocobalamin (VITAMIN B-12) 1,000 mcg Sublingual Tablet Dissolve 1,000 mcg under the tongue every day.    Diazepam (VALIUM) 5 mg Tablet Take 1 tablet by mouth once daily if needed for anxiety or insomnia.  FamoTIDine (PEPCID) 40 mg Tablet Take 1 tablet by mouth every day at bedtime. Due for annual appointment    Metoprolol Succinate (TOPROL XL) 50 mg SR Tablet Take 1 tablet by mouth every day.    Omega-3-DHA-EPA-Fish Oil (FISH OIL) 1,000 mg (120 mg-180 mg) Capsule Take 2,000 mg by mouth every day.     No current facility-administered medications for this visit.         OBJECTIVE:   General Appearance: alert, no distress, cooperative  Pulm: coughing off and on, no increased work of breathing, speaking in full sentences with normal effort  Neuro: answers questions appropriately      ASSESSMENT  and PLAN:  (J01.00) Acute maxillary sinusitis, recurrence not specified  (primary encounter diagnosis)  Comment: fever yesterday, maxillary pain and tooth pain. Will treat for mild sinusitis x 5 days.  Plan:   - Doxycycline 100 mg Tablet x 5 days  - Acetaminophen vs ibuprofen as needed     (R05.9) Cough  Comment: possible viral URI vs need to rule out pneumonia and COVID-19.  Plan:   - COVID-2019 + FLU A/B  - DX CHEST 2 VIEWS   - If positive: isolate through 03/01/2020  - Candidate for Treatment with Neutralizing Ab? Yes  - Acetaminophen and ibuprofen prn fever/pain  - Maintain well-hydrated and rested  - Minimize contacts, observe good hygiene  - COVID-19 precautions discussed, handout provided in MyChart AVS  - Emergency Department precautions discussed, especially if develops increased work of breathing or fever unresponsive to antipyretics        I did review patient's past medical and family/social history, no changes noted.  Barriers to Learning assessed: none. Patient verbalizes understanding of teaching and instructions.    I spent a total of 15 minutes today on this patient's visit. This included 15 minutes of face to face time, more than 50% of which was spent in counseling or coordinating care, for the following medical problem(s) sinusitis, cough. The remainder of the time was spent on review of the patient record (including but not limited to clinical notes, outside records, laboratory and radiographic studies), medical decision-making, and documentation of the visit.    Electronically Signed By:  Weston Settle, MD  Associate Physician  [place]

## 2020-02-23 NOTE — Nursing Note (Signed)
Patient arrived wearing a face mask. Per MDs orders, WEARING FULL PPE, collected a nasopharyngeal swab, 15 seconds each side for covid-19 testing, patient tolerated well. Zakaria Sedor Poole LVN

## 2020-02-24 LAB — COVID-2019 + FLU A/B
Influenza A: NOT DETECTED
Influenza B: NOT DETECTED
SARS-CoV-2: NOT DETECTED

## 2020-03-15 ENCOUNTER — Other Ambulatory Visit: Payer: Self-pay | Admitting: Family Medicine

## 2020-03-15 DIAGNOSIS — E785 Hyperlipidemia, unspecified: Secondary | ICD-10-CM

## 2020-03-15 DIAGNOSIS — I10 Essential (primary) hypertension: Secondary | ICD-10-CM

## 2020-03-15 DIAGNOSIS — K279 Peptic ulcer, site unspecified, unspecified as acute or chronic, without hemorrhage or perforation: Secondary | ICD-10-CM

## 2020-03-17 MED ORDER — ATORVASTATIN 20 MG TABLET
20.0000 mg | ORAL_TABLET | Freq: Every day | ORAL | 1 refills | Status: DC
  Filled 2020-03-17: qty 30, 30d supply, fill #0
  Filled 2020-04-18: qty 30, 30d supply, fill #1

## 2020-03-17 MED ORDER — FAMOTIDINE 40 MG TABLET
40.0000 mg | ORAL_TABLET | Freq: Every day | ORAL | 0 refills | Status: DC
Start: 2020-03-17 — End: 2020-04-18
  Filled 2020-03-17: qty 30, 30d supply, fill #0

## 2020-03-17 MED ORDER — METOPROLOL SUCCINATE ER 50 MG TABLET,EXTENDED RELEASE 24 HR
50.0000 mg | EXTENDED_RELEASE_TABLET | Freq: Every day | ORAL | 1 refills | Status: DC
  Filled 2020-03-17: qty 90, 90d supply, fill #0
  Filled 2020-06-18: qty 90, 90d supply, fill #1

## 2020-03-17 NOTE — Telephone Encounter (Signed)
Due for annual fasting blood tests.  Please remind her to do labs ordered. [name], MD

## 2020-03-19 ENCOUNTER — Other Ambulatory Visit: Payer: Self-pay

## 2020-03-19 NOTE — Telephone Encounter (Signed)
Patient notified. Kalisi Bevill, MA I

## 2020-03-21 ENCOUNTER — Other Ambulatory Visit: Payer: Self-pay

## 2020-04-18 ENCOUNTER — Other Ambulatory Visit: Payer: Self-pay | Admitting: Family Medicine

## 2020-04-18 ENCOUNTER — Other Ambulatory Visit: Payer: Self-pay

## 2020-04-18 DIAGNOSIS — K279 Peptic ulcer, site unspecified, unspecified as acute or chronic, without hemorrhage or perforation: Secondary | ICD-10-CM

## 2020-04-18 MED ORDER — FAMOTIDINE 40 MG TABLET
40.0000 mg | ORAL_TABLET | Freq: Every day | ORAL | 1 refills | Status: DC
Start: 2020-04-18 — End: 2020-11-01
  Filled 2020-04-18: qty 90, 90d supply, fill #0
  Filled 2020-07-19: qty 90, 90d supply, fill #1

## 2020-04-19 ENCOUNTER — Other Ambulatory Visit: Payer: Self-pay

## 2020-04-23 ENCOUNTER — Other Ambulatory Visit: Payer: Self-pay

## 2020-05-07 ENCOUNTER — Ambulatory Visit
Admission: RE | Admit: 2020-05-07 | Discharge: 2020-05-07 | Disposition: A | Discharge status: Home / Self Care | Payer: No Typology Code available for payment source | Source: Ambulatory Visit | Attending: Radiologic Technologist | Admitting: Radiologic Technologist

## 2020-05-07 DIAGNOSIS — Z1231 Encounter for screening mammogram for malignant neoplasm of breast: Secondary | ICD-10-CM | POA: Insufficient documentation

## 2020-05-13 ENCOUNTER — Other Ambulatory Visit: Payer: Self-pay | Admitting: Family Medicine

## 2020-05-13 DIAGNOSIS — E785 Hyperlipidemia, unspecified: Secondary | ICD-10-CM

## 2020-05-15 MED ORDER — ATORVASTATIN 20 MG TABLET
20.0000 mg | ORAL_TABLET | Freq: Every day | ORAL | 0 refills | Status: DC
  Filled 2020-05-15: qty 30, 30d supply, fill #0

## 2020-05-15 NOTE — Telephone Encounter (Signed)
Due for fasting blood tests ordered in Dec.  Please let her know. [name], MD

## 2020-05-16 ENCOUNTER — Other Ambulatory Visit: Payer: Self-pay

## 2020-05-17 ENCOUNTER — Other Ambulatory Visit: Payer: Self-pay

## 2020-05-17 NOTE — Telephone Encounter (Signed)
Called patient no answer left voicemail to call back.  Operator: please relay MD's message . Kelia Gibbon, MA I

## 2020-05-21 NOTE — Telephone Encounter (Signed)
mychart sent. Deion Swift, MA I

## 2020-05-21 NOTE — Telephone Encounter (Signed)
patient has read mychart. Marisa Jenkins, [place] I

## 2020-05-30 ENCOUNTER — Ambulatory Visit: Payer: No Typology Code available for payment source | Attending: Family Medicine

## 2020-05-30 DIAGNOSIS — Z0000 Encounter for general adult medical examination without abnormal findings: Secondary | ICD-10-CM

## 2020-05-30 DIAGNOSIS — E785 Hyperlipidemia, unspecified: Secondary | ICD-10-CM | POA: Insufficient documentation

## 2020-05-30 DIAGNOSIS — K760 Fatty (change of) liver, not elsewhere classified: Secondary | ICD-10-CM | POA: Insufficient documentation

## 2020-05-30 DIAGNOSIS — R7303 Prediabetes: Secondary | ICD-10-CM | POA: Insufficient documentation

## 2020-05-30 LAB — LIPID PANEL WITH DLDL REFLEX
Cholesterol: 221 mg/dL — ABNORMAL HIGH (ref <200)
HDL Cholesterol: 64 mg/dL (ref >40)
LDL Cholesterol Calculation: 142 mg/dL — ABNORMAL HIGH (ref <100)
Non-HDL Cholesterol: 157 mg/dL — ABNORMAL HIGH (ref <=150)
Total Cholesterol: HDL Ratio: 3.5 (ref <4.0)
Triglyceride: 73 mg/dL (ref <150)

## 2020-05-30 LAB — COMPREHENSIVE METABOLIC PANEL
Alanine Transferase (ALT): 36 U/L — ABNORMAL HIGH (ref <=33)
Albumin: 4.8 g/dL (ref 4.0–4.9)
Alkaline Phosphatase (ALP): 102 U/L (ref 35–129)
Aspartate Transaminase (AST): 32 U/L (ref <=41)
Bilirubin Total: 0.6 mg/dL (ref <=1.2)
Calcium: 9.4 mg/dL (ref 8.6–10.0)
Carbon Dioxide Total: 26 mmol/L (ref 22–29)
Chloride: 101 mmol/L (ref 98–107)
Creatinine Serum: 0.59 mg/dL (ref 0.51–1.17)
E-GFR Creatinine (Female): 97 mL/min/{1.73_m2}
Glucose: 106 mg/dL (ref 74–109)
Potassium: 4.2 mmol/L (ref 3.4–5.1)
Protein: 7.6 g/dL (ref 6.6–8.7)
Sodium: 139 mmol/L (ref 136–145)
Urea Nitrogen, Blood (BUN): 24 mg/dL — ABNORMAL HIGH (ref 6–20)

## 2020-05-30 LAB — HEMOGLOBIN A1C
Hgb A1C,Glucose Est Avg: 120 mg/dL
Hgb A1C: 5.8 % — ABNORMAL HIGH (ref 3.9–5.6)

## 2020-06-18 ENCOUNTER — Other Ambulatory Visit: Payer: Self-pay

## 2020-06-18 ENCOUNTER — Other Ambulatory Visit: Payer: Self-pay | Admitting: Family Medicine

## 2020-06-18 DIAGNOSIS — E785 Hyperlipidemia, unspecified: Secondary | ICD-10-CM

## 2020-06-18 NOTE — Telephone Encounter (Signed)
Last physical:    02/15/2020

## 2020-06-20 ENCOUNTER — Other Ambulatory Visit: Payer: Self-pay

## 2020-06-20 MED ORDER — ATORVASTATIN 20 MG TABLET
20.0000 mg | ORAL_TABLET | Freq: Every day | ORAL | 0 refills | Status: DC
  Filled 2020-06-20: qty 90, 90d supply, fill #0

## 2020-06-26 ENCOUNTER — Other Ambulatory Visit: Payer: Self-pay | Admitting: Family Medicine

## 2020-06-26 DIAGNOSIS — G43009 Migraine without aura, not intractable, without status migrainosus: Secondary | ICD-10-CM

## 2020-06-28 ENCOUNTER — Other Ambulatory Visit: Payer: Self-pay

## 2020-06-28 MED ORDER — BUTALBITAL-ASPIRIN-CAFFEINE 50 MG-325 MG-40 MG CAPSULE
1.0000 | ORAL_CAPSULE | Freq: Four times a day (QID) | ORAL | 0 refills | Status: DC | PRN
  Filled 2020-06-28: qty 25, 7d supply, fill #0

## 2020-06-29 ENCOUNTER — Other Ambulatory Visit: Payer: Self-pay

## 2020-07-19 ENCOUNTER — Other Ambulatory Visit: Payer: Self-pay

## 2020-07-24 ENCOUNTER — Other Ambulatory Visit: Payer: Self-pay

## 2020-07-25 ENCOUNTER — Other Ambulatory Visit: Payer: Self-pay

## 2020-10-01 ENCOUNTER — Other Ambulatory Visit: Payer: Self-pay | Admitting: Family Medicine

## 2020-10-01 DIAGNOSIS — E785 Hyperlipidemia, unspecified: Secondary | ICD-10-CM

## 2020-10-01 DIAGNOSIS — I10 Essential (primary) hypertension: Secondary | ICD-10-CM

## 2020-10-02 MED ORDER — METOPROLOL SUCCINATE ER 50 MG TABLET,EXTENDED RELEASE 24 HR
50.0000 mg | EXTENDED_RELEASE_TABLET | Freq: Every day | ORAL | 1 refills | Status: DC
  Filled 2020-10-02: qty 90, 90d supply, fill #0
  Filled 2021-01-04: qty 90, 90d supply, fill #1

## 2020-10-02 MED ORDER — ATORVASTATIN 20 MG TABLET
20.0000 mg | ORAL_TABLET | Freq: Every day | ORAL | 0 refills | Status: DC
  Filled 2020-10-02: qty 90, 90d supply, fill #0

## 2020-10-03 ENCOUNTER — Other Ambulatory Visit: Payer: Self-pay

## 2020-10-08 ENCOUNTER — Other Ambulatory Visit: Payer: Self-pay

## 2020-10-10 ENCOUNTER — Telehealth: Payer: Self-pay | Admitting: Family Medicine

## 2020-10-10 NOTE — Telephone Encounter (Signed)
I do not send any new referrals without an appointment/ evaluation.  Please assist with scheduling. [name], MD

## 2020-10-10 NOTE — Telephone Encounter (Signed)
Referral Request:    Patient is calling to request new referral to Dermatology to rule out Skin Cancer due to family history and another reason due to skin tag on upper eye lid . She requests a referral her insurance is [employer].     [place]

## 2020-10-11 NOTE — Telephone Encounter (Signed)
Called patient no answer left voicemail to call back.  Operator: please relay MD's message and assist with scheduling. Cruz Devilla, MA I

## 2020-10-17 NOTE — Telephone Encounter (Addendum)
Called patient no answer left detailed voicemail to call back and schedule   Operator: please relay MD's message and assist with scheduling. Tiana Loft, [place] I

## 2020-11-01 ENCOUNTER — Other Ambulatory Visit: Payer: Self-pay | Admitting: Family Medicine

## 2020-11-01 DIAGNOSIS — K279 Peptic ulcer, site unspecified, unspecified as acute or chronic, without hemorrhage or perforation: Secondary | ICD-10-CM

## 2020-11-02 MED ORDER — FAMOTIDINE 40 MG TABLET
40.0000 mg | ORAL_TABLET | Freq: Every day | ORAL | 0 refills | Status: DC
Start: 2020-11-02 — End: 2021-02-03
  Filled 2020-11-02: qty 90, 90d supply, fill #0

## 2020-11-05 ENCOUNTER — Other Ambulatory Visit: Payer: Self-pay

## 2020-11-09 ENCOUNTER — Other Ambulatory Visit: Payer: Self-pay

## 2020-12-11 ENCOUNTER — Telehealth: Payer: Self-pay | Admitting: Family Medicine

## 2020-12-11 ENCOUNTER — Other Ambulatory Visit: Payer: Self-pay | Admitting: Family Medicine

## 2020-12-11 DIAGNOSIS — G43009 Migraine without aura, not intractable, without status migrainosus: Secondary | ICD-10-CM

## 2020-12-11 DIAGNOSIS — Z23 Encounter for immunization: Secondary | ICD-10-CM

## 2020-12-11 MED ORDER — BUTALBITAL-ASPIRIN-CAFFEINE 50 MG-325 MG-40 MG CAPSULE
1.0000 | ORAL_CAPSULE | Freq: Four times a day (QID) | ORAL | 0 refills | Status: AC | PRN
  Filled 2020-12-11: qty 25, 7d supply, fill #0

## 2020-12-11 NOTE — Telephone Encounter (Signed)
Immunization Request:    Requesting vaccination(s) for pneumococcal vaccine. Has appt on 12/19/20 and would like to get vaccine that day     Immunization History:  Immunization History   Administered Date(s) Administered    COVID-19 mRNA PF 30 mcg/0.3 mL [employer]) (PURPLE CAP) 07/12/2019, 08/02/2019    Influenza Vaccine, Quadrivalent (Flulaval) 12/04/2015, 12/30/2016    Influenza Vaccine, Quadrivalent (Fluzone/Fluarix) 01/04/2013, 12/29/2013, 12/12/2014    Influenza, Quadrivalent, (FLULAVAL, [place], FLUARIX) 12/15/2017, 12/23/2018    Influenza, Seasonal (AFLURIA, FLULAVAL, FLUVIRIN, FLUZONE) 12/29/2019    Td (Adult) Preservative Free 12/04/2005    Tdap 11/24/2008    Tdap (ADACEL, BOOSTRIX) 03/11/2013, 12/28/2013    Zoster Live (ZOSTAVAX) 07/19/2015    Zoster Recombinant [facility]) 06/26/2016, 01/15/2017       Marisa Jenkins   [facility], [facility] II

## 2020-12-11 NOTE — Telephone Encounter (Signed)
Ordered pneumococcal 20 vaccine.  Please ask if she would like to get the Covid-19 bivalent booster or flu vaccine?  [name], MD

## 2020-12-11 NOTE — Telephone Encounter (Signed)
Last physical 02/15/20    Last refill 06/28/20     Lurena Nida MA1

## 2020-12-12 ENCOUNTER — Other Ambulatory Visit: Payer: Self-pay

## 2020-12-12 NOTE — Telephone Encounter (Signed)
Patient informed and she would like the flu vaccine on the 12th.  Lurena Nida MA1

## 2020-12-12 NOTE — Telephone Encounter (Signed)
Ordered thank you. [name], MD

## 2020-12-13 ENCOUNTER — Telehealth: Payer: Self-pay

## 2020-12-13 ENCOUNTER — Other Ambulatory Visit: Payer: Self-pay

## 2020-12-13 NOTE — Telephone Encounter (Signed)
Prior Auth Needed for Butalbital-Asprin-Caffeine (Fiorinal)  50-325-40mg      [employer]  ID [id number]  Bin [id number]  PCN [id number]  GRP [id number]    Thank you   Kimbely Whiteaker

## 2020-12-17 ENCOUNTER — Other Ambulatory Visit: Payer: Self-pay

## 2020-12-18 ENCOUNTER — Other Ambulatory Visit: Payer: Self-pay

## 2020-12-19 ENCOUNTER — Other Ambulatory Visit: Payer: Self-pay

## 2020-12-19 ENCOUNTER — Encounter: Payer: Self-pay | Admitting: Family Medicine

## 2020-12-19 ENCOUNTER — Ambulatory Visit: Payer: Medicare Other | Admitting: Family Medicine

## 2020-12-19 VITALS — BP 136/80 | HR 69 | Temp 97.2°F | Wt 134.5 lb

## 2020-12-19 DIAGNOSIS — H6121 Impacted cerumen, right ear: Secondary | ICD-10-CM

## 2020-12-19 DIAGNOSIS — G44209 Tension-type headache, unspecified, not intractable: Secondary | ICD-10-CM

## 2020-12-19 DIAGNOSIS — I10 Essential (primary) hypertension: Secondary | ICD-10-CM

## 2020-12-19 DIAGNOSIS — E785 Hyperlipidemia, unspecified: Secondary | ICD-10-CM

## 2020-12-19 DIAGNOSIS — H9011 Conductive hearing loss, unilateral, right ear, with unrestricted hearing on the contralateral side: Secondary | ICD-10-CM

## 2020-12-19 DIAGNOSIS — G43009 Migraine without aura, not intractable, without status migrainosus: Secondary | ICD-10-CM

## 2020-12-19 DIAGNOSIS — Z23 Encounter for immunization: Secondary | ICD-10-CM

## 2020-12-19 DIAGNOSIS — Z79899 Other long term (current) drug therapy: Secondary | ICD-10-CM | POA: Insufficient documentation

## 2020-12-19 MED ORDER — HYDROCHLOROTHIAZIDE 12.5 MG TABLET
12.5000 mg | ORAL_TABLET | Freq: Every day | ORAL | 3 refills | Status: DC
Start: 2020-12-19 — End: 2021-10-02
  Filled 2020-12-19: qty 90, 90d supply, fill #0
  Filled 2021-03-24: qty 90, 90d supply, fill #1
  Filled 2021-06-10: qty 90, 90d supply, fill #2
  Filled 2021-09-17: qty 90, 90d supply, fill #3

## 2020-12-19 NOTE — Telephone Encounter (Signed)
Prior authorization for Butalbital-Asa-Caffeine 50-325-40mg  Capsule  has been approved by [employer] from 12/19/20 until 03/09/22.

## 2020-12-19 NOTE — Telephone Encounter (Signed)
Prior authorization for Butalbital-Aspirin-Caffeine [facility]) 50-325-40 mg per capsule has been submitted and is currently pending review.  Please refer to patient's Problem List (under Medication Therapy Auth or Encounter for Long-Term [current] Use of Medications) for current status.    Thank you.

## 2020-12-19 NOTE — Progress Notes (Signed)
12/19/2020     Patient WAS wearing a surgical mask  Contact and Droplet precautions were followed when caring for the patient.   PPE used by provider during encounter: Surgical mask   EPIC FAMILY MEDICINE VISIT: MRN# [id number]  Name: Marisa Jenkins  Date of birth: October 03, 1955  Primary care physician is [name], MD     Chief Complaint   Patient presents with    Follow-up     High blood pressure     Hearing Problem    Flu Vaccine      SUBJECTIVE:  Marisa Jenkins is a 65yr old female who presents for in person visit for several complaints, including would like review of her blood pressure, flu vaccine, and right ear check due to trouble with hearing. She is also due for pneumonia vaccine since she is now 65 years old. Flu vaccine and Prevnar 20 were ordered at the last visit on October 4th. She is also due for the COVID booster dose, fall assessment and advanced care planning.  Blood pressure at home staying around 140/86.  Taking metoprololAnswers submitted by the patient for this visit:  Patient Health Questionnaire (Submitted on 12/17/2020)  PHQ-2 Score:: 0   She has noticed that her blood pressure is not under good control and has been staying in the 140s over 90 range most of the time though today is a little better.  She has been having more tension type headaches, especially in the frontal area and some in the back of the head.  She has had decreased hearing, mainly in the right ear and has had a problem ear wax on the right ear in the past so would like to see if this is an ongoing trouble or not.  Hearing is pretty good on the left ears so does not feel like she needs to use a hearing a.  Would like to review blood test results since cholesterol has been staying too high, does by improving her diet.  She would like to do some of her vaccines that are due.    REVIEW of SYSTEMS :  Review of Systems   Hearing loss, asymmetric, increased headaches both tension and migraine headaches, no numbness, tingling,  weakness, change in bladder or bowel function.  Denies any fevers or significant weight changes.  Has not had any recent falls.  Taking blood pressure medications as prescribed.  Brings in a blood pressure list since she has been checking it regularly.  Most of the time pulse is around the 60s.  Current Outpatient Medications   Medication Sig    Atorvastatin (LIPITOR) 20 mg Tablet Take 1 tablet by mouth every day.    Butalbital-Aspirin-Caffeine [facility]) 50-325-40 mg per capsule Take 1 capsule by mouth 4 times daily if needed for migraine headache. Not to exceed 4 capsules in 24 hours.  Avoid taking within 8 hours of taking valium, may cause palpitations    Cetirizine (ZYRTEC) 10 mg Tablet Take 1 tablet by mouth once daily if needed for Allergies.    Cyanocobalamin (VITAMIN B-12) 1,000 mcg Sublingual Tablet Dissolve 1,000 mcg under the tongue every day.    FamoTIDine (PEPCID) 40 mg Tablet Take 1 tablet by mouth every day at bedtime.    Hydrochlorothiazide (HYDRO-DIURIL) 12.5 mg Tablet Take 1 tablet by mouth every morning.    Metoprolol Succinate (TOPROL XL) 50 mg SR Tablet Take 1 tablet by mouth every day.    Omega-3-DHA-EPA-Fish Oil (FISH OIL) 1,000 mg (120 mg-180 mg)  Capsule Take 2,000 mg by mouth every day.     No current facility-administered medications for this visit.     Social History     Tobacco Use   Smoking Status Former   Smokeless Tobacco Never   Tobacco Comments    quit at age 35     Problem list and Past medical and surgical history reviewed as indicated  OBJECTIVE:  Vital signs:    BP 136/80 (SITE: left arm, Orthostatic Position: sitting, Cuff Size: regular)   Pulse 69   Temp 36.2 C (97.2 F) (Temporal)   Wt 61 kg (134 lb 7.7 oz)   LMP 10/28/2010   BMI 22.82 kg/m    Physical Exam    General Appearance: healthy, alert, no distress, pleasant affect, cooperative.  Eyes:  conjunctivae and corneas clear. PERRL, EOM's intact. sclerae normal.  Ears:  left canal normal, right canal small amount of  ear wax, but can see through to the tympanic membrane, TMs: amber colored bilaterally, and external inspection of ears show no abnormality.  Neck:  Neck supple. No adenopathy, thyroid symmetric, normal size.  No carotid bruit  Heart:  normal rate and regular rhythm, no murmurs, clicks, or gallops.  Lungs: clear to auscultation.  Abdomen: BS normal.  Abdomen soft, non-tender.  No masses or organomegaly.  Extremities:  no cyanosis, clubbing, or edema.  Neuro: Gait normal. Reflexes normal and symmetric. Sensation and strength grossly normal.  Mental Status: Behavior :normal  Eye Contact: normal  Attention Span: good  Speech: normal volume, rate, and pitch  Mood (pt's report) :Mood pt's report, anxiety  Thought Process: logical and goal directed  Thought Content: no thought disorder or psychotic symptoms  Memory: fair  Judgment: good      Labs reviewed:   Lab Results   Lab Name Value Date/Time    NA 139 05/30/2020 10:38 AM    NA 138 01/25/2015 09:19 AM    K 4.2 05/30/2020 10:38 AM    K 4.5 01/25/2015 09:19 AM    CL 101 05/30/2020 10:38 AM    CL 103 01/25/2015 09:19 AM    CO2 26 05/30/2020 10:38 AM    CO2 25 01/25/2015 09:19 AM    BUN 24 (H) 05/30/2020 10:38 AM    BUN 20 01/25/2015 09:19 AM    CR 0.59 05/30/2020 10:38 AM    CR 0.66 01/25/2015 09:19 AM    GLU 106 05/30/2020 10:38 AM    GLU 115 (H) 01/25/2015 09:19 AM      Lab Results   Lab Name Value Date/Time    HGBA1C 5.8 (H) 05/30/2020 10:38 AM    HGBA1C 5.8 (H) 06/17/2019 11:25 AM    HGBA1C 5.7 (H) 05/11/2018 07:45 AM    HGBA1C 5.7 (H) 05/10/2015 07:54 AM    HGBA1C 5.7 (H) 01/25/2015 09:19 AM    HGBA1C 5.5 07/03/2014 08:34 AM      Lab Results   Lab Name Value Date/Time    CHOL 221 (H) 05/30/2020 10:38 AM    CHOL 211 (H) 01/25/2015 09:19 AM    LDLC 142 (H) 05/30/2020 10:38 AM    LDLC 127 01/25/2015 09:19 AM    HDL 64 05/30/2020 10:38 AM    HDL 67 01/25/2015 09:19 AM    TRIG 73 05/30/2020 10:38 AM    TRIG 85 01/25/2015 09:19 AM        ASSESSMENT:  (I10) Benign essential  hypertension  (primary encounter diagnosis)  Comment:   Plan: Lipid Panel with DLDL  Reflex, Comprehensive         Metabolic Panel            (H90.11) Conductive hearing loss of right ear with unrestricted hearing of left ear  Comment:   Plan:     (H61.21) Impacted cerumen of right ear  Comment:   Plan:  Able to remove wax without flushing.            (E78.5) Hyperlipidemia with target LDL less than 130  Comment:   Plan:   Reviewed diet, exercise, and cholesterol improving.  She needs to continue on the atorvastatin medication.  Not having any significant side effects.  Return to clinic:  6 months if needed , and sooner if symptoms fail to improve as expected or worsen.   Total visit time: 25 minutes, of which  20 % was spent face-to-face counseling about the following diagnoses/ health problems:    1. Benign essential hypertension    2. 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 Impacted cerumen of right ear    4. Hyperlipidemia with target LDL less than 130       Barriers to Learning assessed: none.  Patient verbalizes understanding of teaching and instructions and agrees with the plan of care.    I reviewed the patient's past medical and family/social history, and updated problem list .  Barriers to Learning assessed: none. Patient verbalizes understanding of teaching and instructions.  [name], MD  Family Practice, [facility]  [facility]   This document has been created using Dragon Naturally Speaking and has been checked for content. Some voice recognition errors may have been missed inadvertantly.Please note any grammatical, sound-alike, or syntax errors as likely dictation errors.     [name] If you are reviewing this progress note and have questions about the meaning or medical terms being used, please schedule an appointment or bring it up at your next follow-up appointment.  Medical notes are meant to be a communication tool between medical  professionals and require medical terms to be used for efficiency. You can also look up terms via on-line medical dictionaries such as Medline Plus at   [email or web address].org/.html   Electronically signed by:  [name], MD  Family Practice Department/ [facility]

## 2020-12-19 NOTE — Nursing Note (Signed)
Patient arrived wearing a face mask here for a high dose flu vaccine.  The patient has received a flu vaccine previously: yes  The Inactivated Influenza Vaccine VIS document for the flu injection was given to to review. All questions were referred to the physician.  The Screening Checklist for Contraindications to Inactivated Injectable Influenza Vaccination was completed:  Is the patient sick today?  Does the patient have an allergy to a component of the vaccine?  Has the patient ever had a serious reaction to the influenza vaccine in the past?  Has the patient ever had Guillain-Barre syndrome?  Did the patient answer "Yes" to any of the screening questions? No - Wearing PPE: F[place] mask, the Influenza Vaccine was administered per Policy 3299 using the standing order.   The patient was observed for immediate reactions to the flu vaccine per protocol. No reaction was observed.  Thane Edu LVN

## 2020-12-19 NOTE — Nursing Note (Signed)
Vitals taken, allergies, pain, tobacco, pharmacy,lmp verified.  Patient was wearing surgical mask.   Airborne precautions were followed when caring for the patient.   PPE used by provider during encounter: surgical mask.  Augustino Savastano MA1

## 2020-12-20 ENCOUNTER — Other Ambulatory Visit: Payer: Self-pay

## 2020-12-25 ENCOUNTER — Other Ambulatory Visit: Payer: Self-pay

## 2021-01-02 ENCOUNTER — Ambulatory Visit: Payer: Medicare Other

## 2021-01-02 DIAGNOSIS — Z23 Encounter for immunization: Secondary | ICD-10-CM

## 2021-01-02 NOTE — Nursing Note (Signed)
Patient arrived wearing a mask. Using PPE: face mask, the Immunization VIS documentation(s) were handed to the patient to review. All questions were answered and the patient consented to the Immunization(s) being given. Patient allergies were reviewed and no contraindications were found. Per MDs orders, the PREVNAR 20 immunization(s) was given as ordered. The patient was observed for any immediate reactions to the vaccine, none were observed. Mattias Walmsley Poole LVN

## 2021-01-04 ENCOUNTER — Other Ambulatory Visit: Payer: Self-pay | Admitting: Family Medicine

## 2021-01-04 ENCOUNTER — Other Ambulatory Visit: Payer: Self-pay

## 2021-01-04 DIAGNOSIS — E785 Hyperlipidemia, unspecified: Secondary | ICD-10-CM

## 2021-01-04 NOTE — Telephone Encounter (Signed)
Last physical 02/15/20        Lurena Nida MA1

## 2021-01-05 MED ORDER — ATORVASTATIN 20 MG TABLET
20.0000 mg | ORAL_TABLET | Freq: Every day | ORAL | 0 refills | Status: DC
  Filled 2021-01-05: qty 90, 90d supply, fill #0

## 2021-01-07 ENCOUNTER — Other Ambulatory Visit: Payer: Self-pay

## 2021-02-03 ENCOUNTER — Other Ambulatory Visit: Payer: Self-pay | Admitting: Family Medicine

## 2021-02-03 DIAGNOSIS — K279 Peptic ulcer, site unspecified, unspecified as acute or chronic, without hemorrhage or perforation: Secondary | ICD-10-CM

## 2021-02-05 MED ORDER — FAMOTIDINE 40 MG TABLET
40.0000 mg | ORAL_TABLET | Freq: Every day | ORAL | 0 refills | Status: DC
Start: 2021-02-05 — End: 2021-05-08
  Filled 2021-02-05: qty 90, 90d supply, fill #0

## 2021-02-06 ENCOUNTER — Other Ambulatory Visit: Payer: Self-pay

## 2021-02-08 ENCOUNTER — Other Ambulatory Visit: Payer: Self-pay

## 2021-03-06 ENCOUNTER — Other Ambulatory Visit: Payer: Self-pay

## 2021-03-06 ENCOUNTER — Ambulatory Visit: Payer: Medicare Other | Admitting: Emergency Medicine

## 2021-03-06 DIAGNOSIS — R059 Cough, unspecified: Secondary | ICD-10-CM

## 2021-03-06 DIAGNOSIS — R0989 Other specified symptoms and signs involving the circulatory and respiratory systems: Secondary | ICD-10-CM

## 2021-03-06 DIAGNOSIS — R051 Acute cough: Secondary | ICD-10-CM

## 2021-03-06 MED ORDER — BENZONATATE 100 MG CAPSULE
100.0000 mg | ORAL_CAPSULE | Freq: Three times a day (TID) | ORAL | 1 refills | Status: AC | PRN
  Filled 2021-03-06: qty 50, 9d supply, fill #0

## 2021-03-06 MED ORDER — PSEUDOEPHEDRINE 30 MG TABLET
30.0000 mg | ORAL_TABLET | ORAL | 0 refills | Status: AC | PRN
  Filled 2021-03-06: qty 45, 8d supply, fill #0

## 2021-03-06 MED ORDER — GUAIFENESIN ER 600 MG TABLET, EXTENDED RELEASE 12 HR
600.0000 mg | EXTENDED_RELEASE_TABLET | Freq: Two times a day (BID) | ORAL | 0 refills | Status: AC
  Filled 2021-03-06: qty 30, 15d supply, fill #0

## 2021-03-06 NOTE — Progress Notes (Signed)
Video Visit Note     I performed this clinical encounter by utilizing a real time telehealth video connection between my location and the patient's location. The patient's location was confirmed during this visit. I obtained verbal consent from the patient to perform this clinical encounter utilizing video and prepared the patient by answering any questions they had about the telehealth interaction.    I spent a total of 12 minutes today on this patient's visit which includes time spent on chart review, connection, video visit, medication review, and documentation.     76yr female presents to [place] with recent upper respiratory infection like symptoms over the past week. +cough and congestion  Otherwise feeling fine, denies any significant shortness of breath.     Exam:   NAD, no shortness of breath, speaking full sentences    Plan:   Reassuring clinical presentation.  Likely viral upper respiratory infection, low suspicion for serious bacterial infection. Discussed ddx and treatment options with the patient and our plan will be to use medications for symptomatic control. Return precautions provided to the patient.         Electronically signed by: Garen Lah. Dora Sims, MD   Attending Physician  Professor  Department of Emergency Medicine

## 2021-03-07 ENCOUNTER — Other Ambulatory Visit: Payer: Self-pay

## 2021-03-26 ENCOUNTER — Other Ambulatory Visit: Payer: Self-pay | Admitting: Family Medicine

## 2021-03-26 ENCOUNTER — Other Ambulatory Visit: Payer: Self-pay

## 2021-03-26 DIAGNOSIS — Z0000 Encounter for general adult medical examination without abnormal findings: Secondary | ICD-10-CM

## 2021-04-03 ENCOUNTER — Other Ambulatory Visit: Payer: Self-pay | Admitting: Family Medicine

## 2021-04-03 DIAGNOSIS — E785 Hyperlipidemia, unspecified: Secondary | ICD-10-CM

## 2021-04-03 MED ORDER — ATORVASTATIN 20 MG TABLET
20.0000 mg | ORAL_TABLET | Freq: Every day | ORAL | 0 refills | Status: DC
  Filled 2021-04-03: qty 90, 90d supply, fill #0

## 2021-04-04 ENCOUNTER — Other Ambulatory Visit: Payer: Self-pay

## 2021-04-08 ENCOUNTER — Other Ambulatory Visit: Payer: Self-pay

## 2021-04-15 ENCOUNTER — Other Ambulatory Visit: Payer: Self-pay | Admitting: Family Medicine

## 2021-04-15 DIAGNOSIS — I10 Essential (primary) hypertension: Secondary | ICD-10-CM

## 2021-04-19 ENCOUNTER — Other Ambulatory Visit: Payer: Self-pay

## 2021-04-19 MED ORDER — METOPROLOL SUCCINATE ER 50 MG TABLET,EXTENDED RELEASE 24 HR
50.0000 mg | EXTENDED_RELEASE_TABLET | Freq: Every day | ORAL | 1 refills | Status: DC
  Filled 2021-04-19: qty 90, 90d supply, fill #0
  Filled 2021-07-14: qty 90, 90d supply, fill #1

## 2021-05-08 ENCOUNTER — Other Ambulatory Visit: Payer: Self-pay | Admitting: Family Medicine

## 2021-05-08 DIAGNOSIS — K279 Peptic ulcer, site unspecified, unspecified as acute or chronic, without hemorrhage or perforation: Secondary | ICD-10-CM

## 2021-05-09 ENCOUNTER — Other Ambulatory Visit: Payer: Self-pay

## 2021-05-09 MED ORDER — FAMOTIDINE 40 MG TABLET
40.0000 mg | ORAL_TABLET | Freq: Every day | ORAL | 0 refills | Status: DC
Start: 2021-05-09 — End: 2021-08-07
  Filled 2021-05-09: qty 90, 90d supply, fill #0

## 2021-05-09 NOTE — Telephone Encounter (Signed)
Message sent

## 2021-05-09 NOTE — Telephone Encounter (Signed)
Please schedule an annual exam.

## 2021-05-13 ENCOUNTER — Ambulatory Visit: Payer: Medicare Other

## 2021-05-13 ENCOUNTER — Other Ambulatory Visit: Payer: Self-pay

## 2021-06-10 ENCOUNTER — Other Ambulatory Visit: Payer: Self-pay

## 2021-06-11 ENCOUNTER — Other Ambulatory Visit: Payer: Self-pay

## 2021-06-17 ENCOUNTER — Other Ambulatory Visit: Payer: Self-pay

## 2021-06-27 ENCOUNTER — Other Ambulatory Visit: Payer: Self-pay | Admitting: Family Medicine

## 2021-06-27 DIAGNOSIS — E785 Hyperlipidemia, unspecified: Secondary | ICD-10-CM

## 2021-06-29 MED ORDER — ATORVASTATIN 20 MG TABLET
20.0000 mg | ORAL_TABLET | Freq: Every day | ORAL | 0 refills | Status: DC
  Filled 2021-06-29: qty 90, 90d supply, fill #0

## 2021-07-01 ENCOUNTER — Other Ambulatory Visit: Payer: Self-pay

## 2021-07-03 ENCOUNTER — Other Ambulatory Visit: Payer: Self-pay

## 2021-07-14 ENCOUNTER — Other Ambulatory Visit: Payer: Self-pay

## 2021-07-15 ENCOUNTER — Other Ambulatory Visit: Payer: Self-pay

## 2021-07-16 ENCOUNTER — Other Ambulatory Visit: Payer: Self-pay

## 2021-07-30 ENCOUNTER — Ambulatory Visit
Admission: RE | Admit: 2021-07-30 | Discharge: 2021-07-30 | Disposition: A | Discharge status: Home / Self Care | Payer: Medicare Other | Source: Ambulatory Visit | Attending: Radiologic Technologist | Admitting: Radiologic Technologist

## 2021-07-30 DIAGNOSIS — Z1231 Encounter for screening mammogram for malignant neoplasm of breast: Secondary | ICD-10-CM | POA: Insufficient documentation

## 2021-07-30 DIAGNOSIS — Z0000 Encounter for general adult medical examination without abnormal findings: Secondary | ICD-10-CM | POA: Insufficient documentation

## 2021-08-07 ENCOUNTER — Other Ambulatory Visit: Payer: Self-pay | Admitting: Family Medicine

## 2021-08-07 DIAGNOSIS — K279 Peptic ulcer, site unspecified, unspecified as acute or chronic, without hemorrhage or perforation: Secondary | ICD-10-CM

## 2021-08-07 NOTE — Telephone Encounter (Signed)
Requesting 90 day supply to mail order [employer] prescription

## 2021-08-08 MED ORDER — FAMOTIDINE 40 MG TABLET
40.0000 mg | ORAL_TABLET | Freq: Every day | ORAL | 1 refills | Status: DC
Start: 2021-08-08 — End: 2021-12-19

## 2021-08-23 ENCOUNTER — Ambulatory Visit: Payer: Medicare Other | Attending: Family Medicine

## 2021-08-23 DIAGNOSIS — I10 Essential (primary) hypertension: Secondary | ICD-10-CM | POA: Insufficient documentation

## 2021-08-23 LAB — COMPREHENSIVE METABOLIC PANEL
Alanine Transferase (ALT): 47 U/L — ABNORMAL HIGH (ref <=33)
Albumin: 4.9 g/dL (ref 4.0–4.9)
Alkaline Phosphatase (ALP): 79 U/L (ref 35–129)
Anion Gap: 14 mmol/L (ref 7–15)
Aspartate Transaminase (AST): 47 U/L — ABNORMAL HIGH (ref <=41)
Bilirubin Total: 0.4 mg/dL (ref <=1.2)
Calcium: 9.3 mg/dL (ref 8.6–10.0)
Carbon Dioxide Total: 29 mmol/L (ref 22–29)
Chloride: 100 mmol/L (ref 98–107)
Creatinine Serum: 0.73 mg/dL (ref 0.51–1.17)
E-GFR Creatinine (Female): 91 mL/min/{1.73_m2}
Glucose: 106 mg/dL (ref 74–109)
Potassium: 3.4 mmol/L (ref 3.4–5.1)
Protein: 7.6 g/dL (ref 6.6–8.7)
Sodium: 143 mmol/L (ref 136–145)
Urea Nitrogen, Blood (BUN): 20 mg/dL (ref 6–20)

## 2021-08-23 LAB — LIPID PANEL WITH DLDL REFLEX
Cholesterol: 222 mg/dL — ABNORMAL HIGH (ref <200)
HDL Cholesterol: 57 mg/dL (ref >40)
LDL Cholesterol Calculation: 121 mg/dL — ABNORMAL HIGH (ref <100)
Non-HDL Cholesterol: 165 mg/dL — ABNORMAL HIGH (ref <=150)
Total Cholesterol: HDL Ratio: 3.9 (ref <4.0)
Triglyceride: 218 mg/dL — ABNORMAL HIGH (ref <150)

## 2021-08-27 ENCOUNTER — Other Ambulatory Visit: Payer: Self-pay | Admitting: Family Medicine

## 2021-08-27 DIAGNOSIS — R748 Abnormal levels of other serum enzymes: Secondary | ICD-10-CM

## 2021-09-03 ENCOUNTER — Ambulatory Visit: Payer: Medicare Other | Attending: Family Medicine

## 2021-09-03 DIAGNOSIS — R748 Abnormal levels of other serum enzymes: Secondary | ICD-10-CM | POA: Insufficient documentation

## 2021-09-03 LAB — HEPATIC FUNCTION PANEL
Alanine Transferase (ALT): 38 U/L — ABNORMAL HIGH (ref <=33)
Albumin: 5 g/dL — ABNORMAL HIGH (ref 4.0–4.9)
Alkaline Phosphatase (ALP): 87 U/L (ref 35–129)
Aspartate Transaminase (AST): 38 U/L (ref <=41)
Bilirubin Direct: <0.2 mg/dL (ref 0.0–0.3)
Bilirubin Total: 0.7 mg/dL (ref <=1.2)
Protein: 7.9 g/dL (ref 6.6–8.7)

## 2021-09-04 ENCOUNTER — Encounter: Payer: Self-pay | Admitting: Family Medicine

## 2021-09-04 NOTE — Telephone Encounter (Signed)
Forwarding to Provider to review and respond to patient.  Please see pt's response to results note

## 2021-09-04 NOTE — Telephone Encounter (Signed)
From: [name]  To: Henreitta Cea, MD  Sent: 09/03/2021 3:44 PM PDT  Subject: Blood work    Dr. Sharen Counter,  I saw that you ordered blood work for my liver. And I went and had my blood drawn this am. After I got home I re-read your message and saw that I was supposed to have it done in 3 months, not now.     Stanton Kidney

## 2021-09-17 ENCOUNTER — Other Ambulatory Visit: Payer: Self-pay

## 2021-09-17 ENCOUNTER — Ambulatory Visit: Payer: Medicare Other | Attending: Family Medicine | Admitting: Family Medicine

## 2021-09-17 DIAGNOSIS — R300 Dysuria: Secondary | ICD-10-CM

## 2021-09-17 DIAGNOSIS — N3001 Acute cystitis with hematuria: Secondary | ICD-10-CM | POA: Insufficient documentation

## 2021-09-17 LAB — POC URINALYSIS AUTO W/SCOPE
POC NITRITE URINE: POSITIVE — AB
POC PH URINALYSIS: 5 pH (ref 4.8–7.8)
POC PROTEIN URINE: 300 — AB
POC SP GRAVITY: 1.01 (ref 1.002–1.030)
POC UROBILINOGEN: 8 EU/dL — AB

## 2021-09-17 MED ORDER — SULFAMETHOXAZOLE 800 MG-TRIMETHOPRIM 160 MG TABLET
1.0000 | ORAL_TABLET | Freq: Two times a day (BID) | ORAL | 0 refills | Status: AC
  Filled 2021-09-17: qty 10, 5d supply, fill #0

## 2021-09-17 NOTE — Progress Notes (Signed)
13:23  Chief Complaint   Patient presents with    Urinary Tract Infection       Marisa Jenkins is a 56yrfemale who presents with a chief complaint of    UTI .   For 4 days :  burning , frequency , blood in urine in urine    No lower back pain , no fever , chills or back pain   Using the Azo at the moment.      Clindamycin    Other-Reaction in Comments    Comment:heartburn  Penicillin G    Hives    Comment:About 2010      General Appearance: healthy, alert, no distress, pleasant affect, cooperative.  Eyes:  conjunctivae and sclera normal.  Abdomen: BS normal.  Abdomen soft, non-tender.  No masses or organomegaly.  Mental Status: Appearance/Cooperation: well developed and well nourished, non-toxic, in no respiratory distress and acyanotic, alert, and well groomed and dressed  Attitude: pleasant   Behavior :normal  Eye Contact: normal  Attention Span: good    (R30.0) Dysuria  (primary encounter diagnosis)  Comment:   Plan: POC UA Automated, with physician microscopy            (N30.01) Acute cystitis with hematuria  Comment:   Plan: Culture Urine, Bacti,         Sulfamethoxazole-Trimethoprim (BACTRIM DS)         800-160 mg Tablet            I did review patient's past medical and family/social history, no changes noted.    Barriers to Learning assessed: none. Patient verbalizes understanding of teaching and instructions.     Electronically signed by   TMichael BostonMD    Associate Physician

## 2021-09-17 NOTE — Patient Instructions (Addendum)
Cholesterol content per 100 grams:    Chicken:                         85 mg    Pork:                               86 mg     Beef:                               90 mg    Salmon:                          63 mg    Peanuts:                           0 mg    Almonds:                          0 mg     Cashews:                         0 mg    Quinoa:                             0 mg    Egg Whites:                      0 mg    Egg Yolk:                       1,'085mg'$     Saturated fat increases insulin resistance due to the intramyocellular lipid accumulation in muscle leads to impaired insulin signaling .   Primary culprits in the American diet:   1. Cheese                          8.5 % ( of total )    2. Pizza                             5.9 %   3.Grain-based desserts    5.8 %   4. Dairy desserts               5.6 %   5. Chicken                          5.5%         Read :  Mastering Diabetes Jenny Reichmann, PhD and Hildred Laser   Or listen to the Podcast              Mastering Diabetes Meal Plan          Green light foods are those that actually improve your insulin sensitivity and can be enjoyed in abundance .        Yellow light foods are okay to include in your diet occasionally but are not to be eaten in unlimited quantities.        Red light foods are those that actually create insulin resistance and should be  minimized or completely avoided for best results .             GREEN LIGHT FOODS     Fruits, Starchy Vegetables , Legumes , Intact Whole Grains , Non-Starchy Vegetables , Leafy Greens, Herbs and Spices, and Mushrooms             YELLOW LIGHT FOODS      Fatty plants like avocados, olives, coconuts , nuts including peanuts , seeds, edamame , and pasta alternatives          RED LIGHT FOODS      Red meat, white meat , fish , dairy products, eggs, refined grains, and processed foods, vegetable oils , and refined sugars            Leafy Greens         Dandelion greens                    Arugula                         Pokeberry       Seaweed                                 Spinach                        Purslane       Parsley                                    Beet greens                 Endive       Collards                                   Turnip greens              Romaine lettuce       Lamb's-quarters                      Fennel                         Red leaf lettuce       Leeks                                      Green onions               Green leaf lettuce       Chives                                     Swiss chard                 Iceberg lettuce      [place]  Watercress                  Butter lettuce       Mustard greens                       Chard                           Nopales           Intact Whole Grains         Khorasan wheat ( kamut )           Millet                          Amaranth     Spelt                                            Rye                            Wild Rice    Barley (hulled only,no pearl)       Sorghum                     Teff     Brown rice                                   Quinoa                        Buckwheat     Bulgur                                          Wheat, hard red winter          Starchy Vegetables       Taro                                           Cassava ( yuca)                  Carrots     Yams                                          Red Potatoes                      Butternut Squash     Corn                                           Parsnips                              Spaghetti squash  White potatoes                           Acorn squash                      Pumpkin     Sweet potatoes                          Hubbard squash           Beans, Peas , Lentils       Chickpeas                                  Adzuki beans                        Lentils    Pink beans                                 Kidney beans                       Lima beans     Pinto beans                                Fava beans                           Broad beans    White beans                               Pigeon peas                         Mung beans     Navy beans                                Great Northern beans          Green peas     Cranberry beans                        Split peas                             Yard-long beans    Black beans                               Moth beans                           Yellow beans     French beans                            Green beans

## 2021-09-20 LAB — CULTURE URINE, BACTI
URINE CULTURE: >100000 — AB
URINE CULTURE: >100000 — AB

## 2021-09-29 NOTE — Progress Notes (Signed)
 10:00 a.m.  10/02/2021    EPIC FAMILY MEDICINE VISIT: MRN# [id number]  Name: Marisa Jenkins  Date of birth: Jul 13, 1955  Primary care physician is Francella Landon Record, MD     ASSESSMENT & PLAN            Diagnoses and all orders for this visit:  Actinic keratosis  -     DESTRUC PREMALIGNANT, FIRST LESION  Dermatofibroma of left lower leg  Elevated liver enzymes  -     Hepatic Function Panel; Future  Generalized anxiety disorder  -     Diazepam  (VALIUM ) 5 mg Tablet; Take 1 tablet by mouth once daily if needed for anxiety or insomnia.  Benign essential hypertension  -     Hydrochlorothiazide  (HYDRO-DIURIL) 12.5 mg Tablet; Take 1 tablet by mouth every morning.  -     Metoprolol  Succinate (TOPROL  XL) 50 mg SR Tablet; Take 1 tablet by mouth every day.    She has a history of basal cell carcinoma, on the anterior upper chest, and has had several skin cancers.  Currently with a lesion on her nose it is daily, most likely actinic keratoses.  Advised treatment with liquid nitrogen, and call back if fails to resolve.  CRYOTHERAPY: Verbal informed consent for cryotherapy was obtained after cryotherapy was explained. Risks include pain, blistering, skin pigment changes, skin infection, scarring and failure to resolve the condition. [name] indicates she understands and agrees to proceed.  The 1 nose lesion lesion(s) as described above destroyed with liquid nitrogen cryotherapy. The procedure was tolerated well. There were no complications. Postprocedural care was discussed. Return in 2-4 weeks if lesion(s) don't resolve.      Reviewed skin findings, and she does have several new firm papules on the lower legs, which are nontender, and consistent with dermatofibromas.  Reassured, call back if increased size scaliness, or changes.  General skin care measures discussed, sun protection and follow-up with Dermatology annually since she has a history of multiple skin cancers.    Reviewed previous lab test results and liver enzymes  were elevated.  She stopped all alcohol, and the statin medicine, so would like to see if her level came back down to normal.  Lab order sent, nonfasting and she will do that today.  Blood pressure is slightly elevated, though at home blood pressures have been doing well per patient.    Refilled on blood pressure medicines, since she is asymptomatic and doing well.    She requested refill on Valium , since she does get panic attacks, has been more anxious with child going to move back in with her and she does not really get along that well with them.         Modified Medications    Modified Medication Previous Medication    DIAZEPAM  (VALIUM ) 5 MG TABLET Diazepam  (VALIUM ) 5 mg Tablet       Take 1 tablet by mouth once daily if needed for anxiety or insomnia.    Take 1 tablet by mouth once daily if needed for anxiety or insomnia.    HYDROCHLOROTHIAZIDE  (HYDRO-DIURIL) 12.5 MG TABLET Hydrochlorothiazide  (HYDRO-DIURIL) 12.5 mg Tablet       Take 1 tablet by mouth every morning.    Take 1 tablet by mouth every morning.    METOPROLOL  SUCCINATE (TOPROL  XL) 50 MG SR TABLET Metoprolol  Succinate (TOPROL  XL) 50 mg SR Tablet       Take 1 tablet by mouth every day.    Take 1 tablet  by mouth every day.   wants to do Curves and Pilates    SUBJECTIVE      Chief Complaint   Patient presents with    Skin Problem     Spot on nose        Marisa Jenkins is a 22yr female Her weight is 61 kg (134 lb 7.7 oz). Her temperature is 36.7 C (98 F). Her blood pressure is 137/80 and her pulse is 70.  She had concerns including Skin Problem (Spot on nose ).  SUBJECTIVE:  Marisa Jenkins is a 66yr old female who presents for an in person visit for Spot on nose that hasn't gone away in 2 months  It is somewhat scaly, a little red, and has not seem to be growing but is annoying to her.  She has a history of multiple skin cancers, including basal cell skin cancer.  Denies any history of melanoma.  In general is protective with her skin, but does not always put  sunscreen on her face.  Usually using a hat.  Has a couple of skin bumps on lower legs she would like checked as well.  Also noted last liver enzymes were elevated, while taking statin medicine.  She has been on the statin medicine for over a year however, and liver tests have been normal in the past.  Does drink on a regular basis 3 or 4 in a week, but quit drinking altogether.  Denies any abdominal pain, no recent weight increase.    She is anxious about her son moving back in, has been more stressed by situational things that are happening with the family.  Requests refill on Valium  which has helped well in the past.  Takes it sparingly.    Answers submitted by the patient for this visit:  Other (Submitted on 10/02/2021)  Please describe your symptoms or the medical condition for which you are being seen: I've had a spot on my nose for two months and it has not gone away.  Have you had these symptoms or been seen for this before?: No  Please list any medications you are currently taking for this condition.: N/A  How long have you been having these symptoms or issue?: more than 1 month ago  REVIEW of SYSTEMS As per history of the present illness    Problem list, Past medical, medication list, allergies, and surgical history reviewed as indicated  OBJECTIVE      Vital signs:    BP 137/80 (SITE: right arm, Orthostatic Position: sitting, Cuff Size: large)   Pulse 70   Temp 36.7 C (98 F)   Wt 61 kg (134 lb 7.7 oz)   LMP 10/28/2010   BMI 22.82 kg/m      General Appearance: healthy, alert, no distress, pleasant affect, cooperative.  Heart:  normal rate and regular rhythm, no murmurs, clicks, or gallops.  Lungs: clear to auscultation.  Abdomen: BS normal.  Abdomen soft, non-tender.  No masses or organomegaly.  Extremities:  no cyanosis, clubbing, or edema and distal pulses normal.  Skin:  Scaly erythematous papule on the tip of the nose, also with firm tiny papule on the left thigh, as well as scattered firm  papules that dimple on the lower extremities.     Attitude:  Normal  Behavior :  Anxious  Eye Contact: normal  Attention Span: fair  Speech: normal volume, rate, and pitch  Mood (pt's report) :Mood pt's report, excessive guilt, depressed mood, insomnia, early AM awakening, anxiety  Affect:anxious  Suicidal Ideation: no  Memory: good, sometimes distracted  Judgment: fair   Relatedness: good    Labs reviewed:   Lab Results   Lab Name Value Date/Time    NA 143 08/23/2021 09:04 AM    NA 138 01/25/2015 09:19 AM    K 3.4 08/23/2021 09:04 AM    K 4.5 01/25/2015 09:19 AM    CL 100 08/23/2021 09:04 AM    CL 103 01/25/2015 09:19 AM    CO2 29 08/23/2021 09:04 AM    CO2 25 01/25/2015 09:19 AM    BUN 20 08/23/2021 09:04 AM    BUN 20 01/25/2015 09:19 AM    CR 0.73 08/23/2021 09:04 AM    CR 0.66 01/25/2015 09:19 AM    GLU 106 08/23/2021 09:04 AM    GLU 115 (H) 01/25/2015 09:19 AM    .latts                Return to clinic:  If skin lesions fails to improve, or persistently abnormal liver tests,  and sooner if symptoms fail to improve as expected or worsen.   Total time I spent in care of this patient for this visit (excluding time spent on other billable services) was 28 minutes. about the above diagnoses/ health problems    Barriers to Learning assessed: none.  Patient verbalizes understanding of teaching and instructions and agrees with the plan of care.   I reviewed the patient's past medical and family/social history, and updated problem list .    This document has been created using Dragon Naturally Speaking and has been checked for content. Some voice recognition errors may have been missed inadvertantly.Please note any grammatical, sound-alike, or syntax errors as likely dictation errors.     [name] If you are reviewing this progress note and have questions about the meaning or medical terms being used, please schedule an appointment or bring it up at your next follow-up appointment.  Medical notes are meant to be a  communication tool between medical professionals and require medical terms to be used for efficiency. You can also look up terms via on-line medical dictionaries such as Medline Plus at   LunchLookup.de.      Electronically signed by:  Landon Burnet, MD

## 2021-10-02 ENCOUNTER — Other Ambulatory Visit: Payer: Self-pay

## 2021-10-02 ENCOUNTER — Ambulatory Visit: Payer: Medicare Other | Admitting: Family Medicine

## 2021-10-02 VITALS — BP 137/80 | HR 70 | Temp 98.0°F | Wt 134.5 lb

## 2021-10-02 DIAGNOSIS — D2372 Other benign neoplasm of skin of left lower limb, including hip: Secondary | ICD-10-CM

## 2021-10-02 DIAGNOSIS — Z85828 Personal history of other malignant neoplasm of skin: Secondary | ICD-10-CM

## 2021-10-02 DIAGNOSIS — I10 Essential (primary) hypertension: Secondary | ICD-10-CM

## 2021-10-02 DIAGNOSIS — F411 Generalized anxiety disorder: Secondary | ICD-10-CM

## 2021-10-02 DIAGNOSIS — R748 Abnormal levels of other serum enzymes: Secondary | ICD-10-CM

## 2021-10-02 DIAGNOSIS — L570 Actinic keratosis: Secondary | ICD-10-CM

## 2021-10-02 MED ORDER — METOPROLOL SUCCINATE ER 50 MG TABLET,EXTENDED RELEASE 24 HR: 50.0000 mg | EXTENDED_RELEASE_TABLET | Freq: Every day | ORAL | 3 refills | Status: DC

## 2021-10-02 MED ORDER — HYDROCHLOROTHIAZIDE 12.5 MG TABLET
12.5000 mg | ORAL_TABLET | Freq: Every day | ORAL | 3 refills | Status: DC
Start: 2021-10-02 — End: 2022-09-23

## 2021-10-02 MED ORDER — DIAZEPAM 5 MG TABLET
5.0000 mg | ORAL_TABLET | Freq: Every day | ORAL | 0 refills | Status: DC | PRN
  Filled 2021-10-02: qty 30, 30d supply, fill #0

## 2021-10-02 NOTE — Nursing Note (Signed)
Vital signs taken, allergies verified, screened for pain and chief complaint noted. Shonda Mandarino MAII     Patient WAS wearing a surgical mask  Contact, Droplet, and Airborne precautions were followed when caring for the patient.   PPE used by provider during encounter: Surgical mask

## 2021-10-03 DIAGNOSIS — D2372 Other benign neoplasm of skin of left lower limb, including hip: Secondary | ICD-10-CM | POA: Insufficient documentation

## 2021-11-20 ENCOUNTER — Telehealth: Payer: Self-pay

## 2021-11-20 NOTE — Telephone Encounter (Addendum)
MEDICATION ADHERENCE ASSESSMENT     Marisa Jenkins has been selected for a medication adherence assessment for Centers for Medicare & Medicaid Services (CMS) STARs measures.    Patient was interviewed on 11/20/2021 to assess and resolve adherence barriers.       Assessment & Plan     ATORVASTATIN TAB '20MG'$ . Last filled on 06/30/21 for 90-day and is due for refill. Marisa Jenkins voiced today that her doctor had suggested her to stop taking this medication for about 3 months due to high levels of ALT (SGPT). Medication is still active in Epic.   Last ALT was 38 IU/L on 09/03/2021.   No office visit scheduled.       Further Considerations for Provider    Please consider following-up with Marisa Jenkins regarding ATORVASTATIN TAB '20MG'$  and if is she should resume the medication. Also, please consider removing medication from Epic if patient is not to restart medication.        Talbot Grumbling, Pharm Tech  Available on Teams     Estimated total time spent: 30 min     Case discussed & reviewed with pharmacy technician; agree with assessment and plan.    Hassie Bruce, PharmD  Population Health Pharmacy Team  [facility]  Available on Teams & [place] Text

## 2021-11-21 ENCOUNTER — Encounter: Payer: Self-pay | Admitting: Family Medicine

## 2021-11-21 NOTE — Telephone Encounter (Signed)
Thank you, I would like for her to recheck her nonfasting liver function tests which have already been ordered.  She will check again in about a month, and see if going off the cholesterol medicine helped her liver enzyme at all or not at all.  My chart message sent to the patient to let her know.  [name], MD

## 2021-12-04 ENCOUNTER — Other Ambulatory Visit: Payer: Self-pay | Admitting: Family Medicine

## 2021-12-04 ENCOUNTER — Ambulatory Visit: Payer: Medicare Other | Attending: Family Medicine

## 2021-12-04 DIAGNOSIS — K769 Liver disease, unspecified: Secondary | ICD-10-CM

## 2021-12-04 DIAGNOSIS — K760 Fatty (change of) liver, not elsewhere classified: Secondary | ICD-10-CM

## 2021-12-04 DIAGNOSIS — K700 Alcoholic fatty liver: Secondary | ICD-10-CM

## 2021-12-04 DIAGNOSIS — R748 Abnormal levels of other serum enzymes: Secondary | ICD-10-CM

## 2021-12-04 LAB — HEPATIC FUNCTION PANEL
Alanine Transferase (ALT): 56 U/L — ABNORMAL HIGH (ref <=33)
Albumin: 4.8 g/dL (ref 4.0–4.9)
Alkaline Phosphatase (ALP): 71 U/L (ref 35–129)
Aspartate Transaminase (AST): 57 U/L — ABNORMAL HIGH (ref <=41)
Bilirubin Direct: 0.2 mg/dL (ref 0.0–0.3)
Bilirubin Total: 0.9 mg/dL (ref <=1.2)
Protein: 7.8 g/dL (ref 6.6–8.7)

## 2021-12-17 ENCOUNTER — Other Ambulatory Visit: Payer: Self-pay | Admitting: Family Medicine

## 2021-12-17 DIAGNOSIS — K279 Peptic ulcer, site unspecified, unspecified as acute or chronic, without hemorrhage or perforation: Secondary | ICD-10-CM

## 2021-12-18 NOTE — Telephone Encounter (Signed)
Recent Visits  Date Type Provider Dept   10/02/21 Office Visit [name], MD [facility]   09/17/21 Office Visit Oda Cogan, MD [facility]   03/06/21 Telemedicine Scheduled Dimitri Ped, MD [facility]   12/19/20 Office Visit [name], MD [facility]   Showing recent visits within past 540 days with a meds authorizing provider and meeting all other requirements  Future Appointments  No visits were found meeting these conditions.  Showing future appointments within next 150 days with a meds authorizing provider and meeting all other requirements

## 2021-12-19 ENCOUNTER — Ambulatory Visit: Payer: Medicare Other | Attending: Family Medicine

## 2021-12-19 ENCOUNTER — Telehealth: Payer: Self-pay | Admitting: Family Medicine

## 2021-12-19 ENCOUNTER — Ambulatory Visit (INDEPENDENT_AMBULATORY_CARE_PROVIDER_SITE_OTHER): Payer: Medicare Other

## 2021-12-19 DIAGNOSIS — R748 Abnormal levels of other serum enzymes: Secondary | ICD-10-CM | POA: Insufficient documentation

## 2021-12-19 DIAGNOSIS — Z23 Encounter for immunization: Secondary | ICD-10-CM | POA: Insufficient documentation

## 2021-12-19 DIAGNOSIS — K769 Liver disease, unspecified: Secondary | ICD-10-CM

## 2021-12-19 DIAGNOSIS — K700 Alcoholic fatty liver: Secondary | ICD-10-CM | POA: Insufficient documentation

## 2021-12-19 DIAGNOSIS — K760 Fatty (change of) liver, not elsewhere classified: Secondary | ICD-10-CM

## 2021-12-19 LAB — HEPATIC FUNCTION PANEL
Alanine Transferase (ALT): 22 U/L (ref <=33)
Albumin: 4.8 g/dL (ref 4.0–4.9)
Alkaline Phosphatase (ALP): 66 U/L (ref 35–129)
Aspartate Transaminase (AST): 23 U/L (ref <=41)
Bilirubin Direct: <0.2 mg/dL (ref 0.0–0.3)
Bilirubin Total: 0.4 mg/dL (ref <=1.2)
Protein: 7.3 g/dL (ref 6.6–8.7)

## 2021-12-19 LAB — INR
INR: 1.07 (ref 0.87–1.18)
Prothrombin Time: 9.8 secs (ref 8.4–10.7)

## 2021-12-19 LAB — APTT STUDIES: aPTT: 31.6 secs (ref 28.8–39.9)

## 2021-12-19 NOTE — Nursing Note (Signed)
The Inactivated Influenza Vaccine VIS document for the Influenza Vaccine was given to the patient/caregiver or legal representative to review. All questions were answered or referred to the physician.    The patient has no contraindications to receive the Influenza Vaccine.The Influenza Vaccine was administered per Policy 504.      Stevana Dufner, LVN

## 2021-12-19 NOTE — Telephone Encounter (Signed)
Delila Spence faxed over report. Placed on MD's in box for review

## 2021-12-25 NOTE — Telephone Encounter (Signed)
Eye exam findings are normal except for presbyopia.  Noted.  Please scan to electronic medical record.  Glasses prescription in record.  No other treatment needed. [name], MD

## 2021-12-26 NOTE — Telephone Encounter (Signed)
Report sent to scanning

## 2022-03-12 ENCOUNTER — Encounter: Payer: Self-pay | Admitting: Family Medicine

## 2022-03-12 NOTE — Telephone Encounter (Signed)
Forwarding to Provider to review and respond to patient.  Referral Request:    Patient is calling to request new referral to Hepatology regarding a problem of hx of fatty liver and abnormal liver enzymes. She requests a referral her insurance is [employer]    Recent Visits  Date Type Provider Dept   10/02/21 Office Visit [name], MD [facility]   09/17/21 Office Visit Oda Cogan, MD [facility]   03/06/21 Telemedicine Scheduled Dimitri Ped, MD [facility]   12/19/20 Office Visit [name], MD [facility]   Showing recent visits within past 540 days with a meds authorizing provider and meeting all other requirements  Future Appointments  No visits were found meeting these conditions.  Showing future appointments within next 150 days with a meds authorizing provider and meeting all other requirements

## 2022-03-12 NOTE — Telephone Encounter (Signed)
From: [name]  To: Henreitta Cea, MD  Sent: 03/11/2022 12:20 PM PST  Subject: Liver specialist    Dr. Sharen Counter,  I would like to request a referral to a liver specialist as it has been determined that I have a fatty liver and my liver enzymes have not been the best. I would like to know how compromised my liver is.    Thank you and Happy New Year,  Marisa Jenkins

## 2022-04-03 ENCOUNTER — Encounter: Payer: Self-pay | Admitting: Family Medicine

## 2022-04-03 DIAGNOSIS — K760 Fatty (change of) liver, not elsewhere classified: Secondary | ICD-10-CM

## 2022-04-03 NOTE — Telephone Encounter (Signed)
Duplicate Message. Please refer to MyChart message dated 03/12/2022. Will close this encounter.

## 2022-04-03 NOTE — Telephone Encounter (Signed)
From: [name]  To: Henreitta Cea, MD  Sent: 04/03/2022 8:33 AM PST  Subject: Referral     I sent this message on January 2nd and have not heard back.    Dr. Sharen Counter,  I would like to request a referral to a liver specialist as it has been determined that I have a fatty liver and my liver enzymes have not been the best. I would like to know how compromised my liver is.     Thank you and Happy New Year,  Marisa Jenkins

## 2022-04-16 NOTE — Addendum Note (Signed)
Addended by: Henreitta Cea on: 04/16/2022 04:46 AM     Modules accepted: Orders

## 2022-04-24 ENCOUNTER — Ambulatory Visit
Admission: RE | Admit: 2022-04-24 | Discharge: 2022-04-24 | Disposition: A | Discharge status: Home / Self Care | Payer: Medicare Other | Source: Ambulatory Visit | Attending: Family Medicine | Admitting: Family Medicine

## 2022-04-24 DIAGNOSIS — K760 Fatty (change of) liver, not elsewhere classified: Secondary | ICD-10-CM

## 2022-04-25 DIAGNOSIS — R748 Abnormal levels of other serum enzymes: Secondary | ICD-10-CM

## 2022-04-30 NOTE — Telephone Encounter (Signed)
See other message dated 04/03/22  Marisa Jenkins M.D.

## 2022-07-04 ENCOUNTER — Encounter: Payer: Self-pay | Admitting: Family Medicine

## 2022-07-04 NOTE — Telephone Encounter (Signed)
Appt recommended

## 2022-07-04 NOTE — Telephone Encounter (Signed)
From: [name]  To: Barbaraann Rondo  Sent: 07/03/2022 2:51 PM PDT  Subject: Bone Density screening    Dr. Carlynn Purl,  I received a letter from [facility] encouraging me to get a BMD done by contacting you for a referral to have it done.    Thank you

## 2022-07-19 NOTE — Progress Notes (Incomplete)
07/19/2022      Medicare Wellness Visit for Marisa Jenkins   Plan for Treatment, Screening/Prevention  No diagnosis found.   ***   Plan of Care Discussed With:    family:20  MARGUERITE EIGHMY is a 67yr old female is here for her Medicare wellness visit.       Also  complains of  ***   Enter/Edit History  TriageIIWorkupIResults GroupedIResults ReportIECGsIMicroIVS/MedsIRN NotesIChart ReviewIDispo:18486    TIP  Pertinent interpretation of ,Labs, Imaging History, and/or Social Determinants related to medical decision making:15640     Last pap smear test *** Recent Pap  Last mammogram *** ***Recent Mammogram  Prostate specific antigen test ***   Discussed with patient regarding benefits and risks of PSA testing. Patient  PSA Discussion (select applicable option):35084       last bone density test/ dexa scan *** ***Labs, Imaging  Last colonoscopy ***. due for follow up ***. ***  Health Maintenance   Advance Care Planning (Select all that apply):33744         Last labs *** ***.    Labs, Imaging  Vaccines:   Due for ***   Pneumonia vaccine up to date ***  Respiratory syncytial virus vaccine ***     Last eye exam ***   SDE is required. Please contact your administrator to configure this SmartLink.   Health History  I reviewed and appropriately updated her lists of current problems, past medical/surgical history, and social history.   History  Lifestyle:   Diet:  OPMED DIET TYPES:11070,     and  Exercise Habits:10492.    She reports that she has never *** smoked. History  She has never used smokeless tobacco.   She  reports current alcohol use of about *** standard drinks of alcohol per week.   She  reports that she does not use drugs.    Mood disorder screening:   PHQ2/PHQ9 Screening    Cognitive screen: ***     History  (No need to delete link; will disappear on signing note):15640    Chart Review  (No need to delete link; will disappear on signing note):15640    Results Review  (No need to delete link; will disappear  on signing note):15640    Summary  (No need to delete link; will disappear on signing note):15640    Medications  (No need to delete link; will disappear on signing note):15640    Social Determinants of Health  (No need to delete link; will disappear on signing note):15640  Chart Review Labs  Loraine Leriche as Reviewed  Medication Management  Health Maintenance  Problem List  Stroke Assessment Flowsheet  Synopsis  Sexual History      Functional Status:   ADL/IADL Screening    Fall risks Assessment:   Fall Risk Screening  Advance Care Planning:   The patient /  surrogate was counseled about the importance of Advance Care Planning. In addition:   OPMED ADVANCE DIRECTIVES (select all that apply):11184   Advanced Care Planning Documents    REVIEW of SYSTEMS   As per history of the present illness    Optional Review of Systems:10510    Exam:    LMP 10/28/2010     Optional General Physical Assessment:14890    Lab Results   Lab Name Value Date/Time    NA 143 08/23/2021 09:04 AM    NA 138 01/25/2015 09:19 AM    K 3.4 08/23/2021 09:04 AM    K 4.5 01/25/2015 09:19 AM  CL 100 08/23/2021 09:04 AM    CL 103 01/25/2015 09:19 AM    CO2 29 08/23/2021 09:04 AM    CO2 25 01/25/2015 09:19 AM    BUN 20 08/23/2021 09:04 AM    BUN 20 01/25/2015 09:19 AM    CR 0.73 08/23/2021 09:04 AM    CR 0.66 01/25/2015 09:19 AM    GLU 106 08/23/2021 09:04 AM    GLU 115 (H) 01/25/2015 09:19 AM      Lab Results   Lab Name Value Date/Time    HGBA1C 5.8 (H) 05/30/2020 10:38 AM    HGBA1C 5.8 (H) 06/17/2019 11:25 AM    HGBA1C 5.7 (H) 05/11/2018 07:45 AM    HGBA1C 5.7 (H) 05/10/2015 07:54 AM    HGBA1C 5.7 (H) 01/25/2015 09:19 AM    HGBA1C 5.5 07/03/2014 08:34 AM      Lab Results   Lab Name Value Date/Time    CHOL 222 (H) 08/23/2021 09:04 AM    CHOL 211 (H) 01/25/2015 09:19 AM    LDLC 121 (H) 08/23/2021 09:04 AM    LDLC 127 01/25/2015 09:19 AM    HDL 57 08/23/2021 09:04 AM    HDL 67 01/25/2015 09:19 AM    TRIG 218 (H) 08/23/2021 09:04 AM    TRIG 85 01/25/2015  09:19 AM         Review Plan - If inaccurate, update diagnoses and orders in Order Entry and Modifiers/Overrides in Health Maintenance. Then "refresh" the smartLinks. When complete, press F2 then 'delete' to remove this text    Education Plan:  See information in the Patient Instructions section      Barriers to Learning assessed: none. Patient verbalizes understanding of teaching and instructions.    This document has been created using Dragon Naturally Speaking and has been checked for content. Some voice recognition errors may have been missed inadvertantly.Please note any grammatical, sound-alike, or syntax errors as likely dictation errors.     Marisa Jenkins If you are reviewing this progress note and have questions about the meaning or medical terms being used, please schedule an appointment or bring it up at your next follow-up appointment.  Medical notes are meant to be a communication tool between medical professionals and require medical terms to be used for efficiency. You can also look up terms via on-line medical dictionaries such as Medline Plus at   MuscleTreatments.it.html   Electronically signed by:  Freddie Breech, MD  Family Practice, [facility]  [facility]

## 2022-07-20 IMAGING — CR CHEST 2 VWS PA LAT
1 series · 2 of 2 positions shown · non-contrast
Comparison: None

HISTORY: Positive tuberculosis skin test
TECHNIQUE: PA and lateral chest radiographs

[Series 1: pa · 0.17mm/px · 2 of 2 slices shown]
[im 1/2]
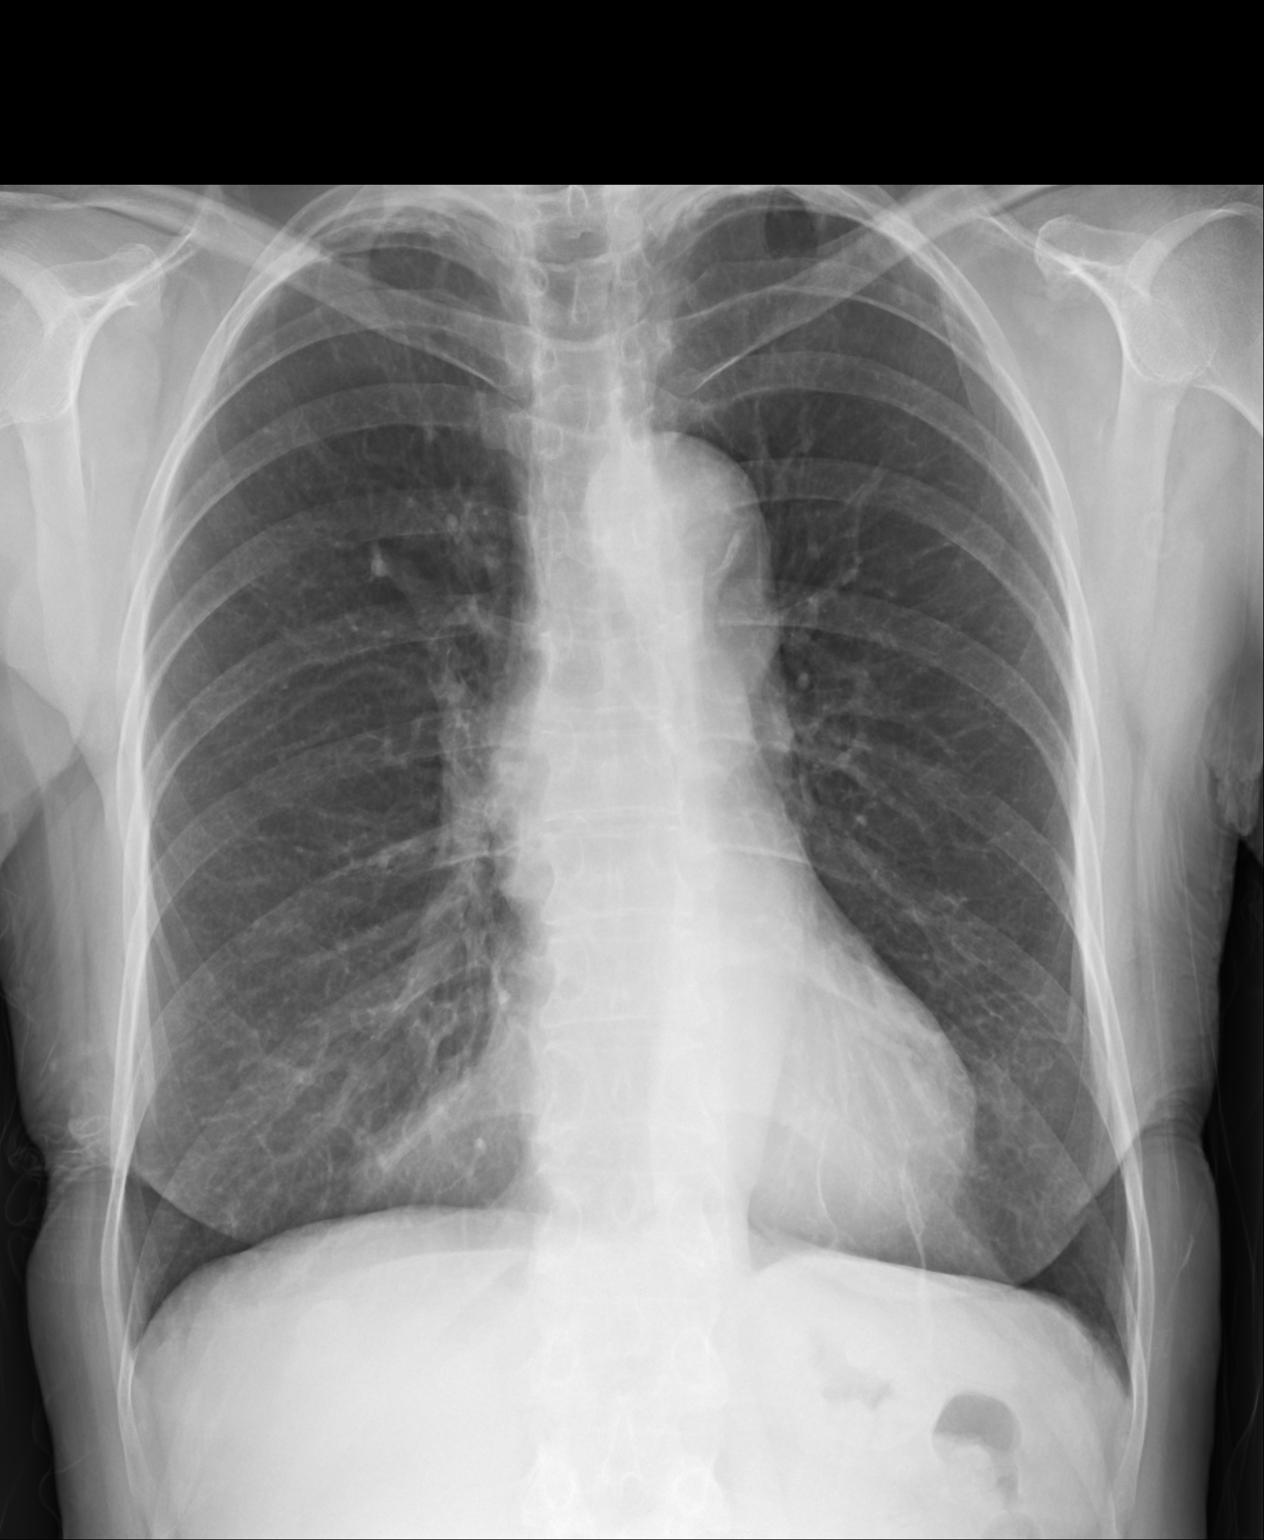
[im 2/2]
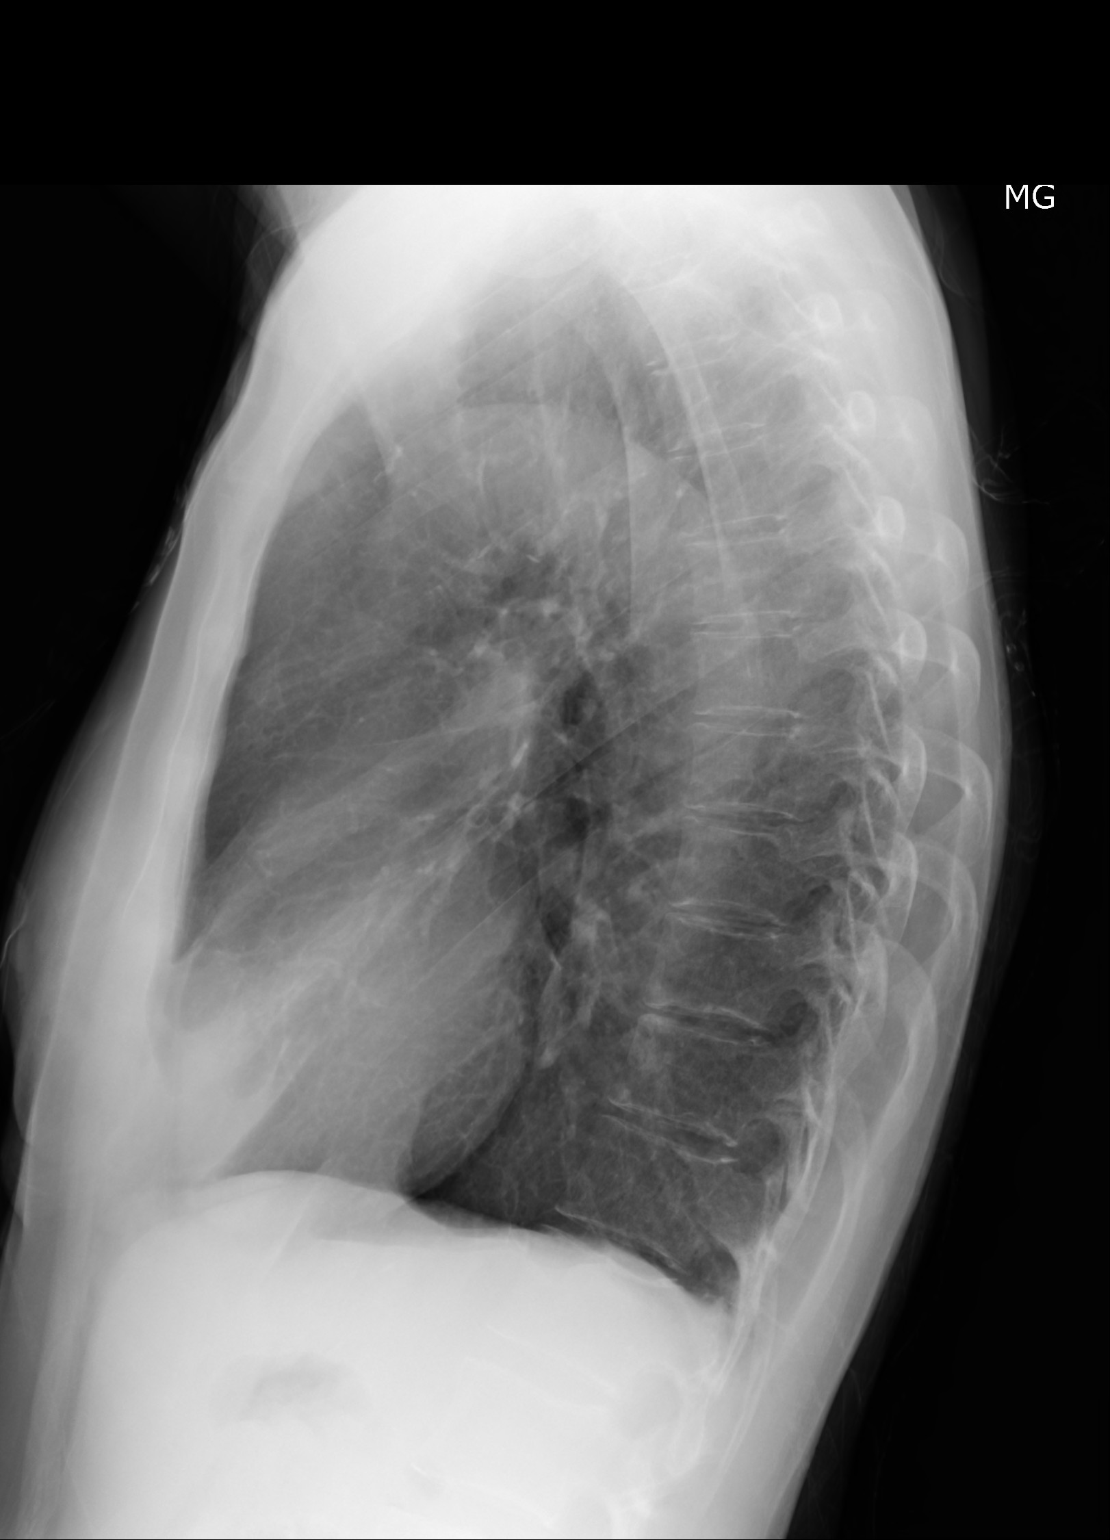

[2 of 2 positions shown; findings below may reference images not displayed]

FINDINGS: Heart size is normal. No pleural effusion or pneumothorax. No consolidation. Hyperexpanded lungs with flattening of the diaphragms. Mild scarring at the lung apices. Calcification of the thoracic aorta.
IMPRESSION: 1. No acute chest findings.

2. COPD.

## 2022-07-24 ENCOUNTER — Ambulatory Visit: Payer: Medicare Other | Admitting: Family Medicine

## 2022-08-11 ENCOUNTER — Other Ambulatory Visit: Payer: Self-pay | Admitting: Family Medicine

## 2022-08-11 DIAGNOSIS — Z1231 Encounter for screening mammogram for malignant neoplasm of breast: Secondary | ICD-10-CM

## 2022-08-11 NOTE — Progress Notes (Signed)
Order lipid panel and liver function tests after she does theses nonfasting labs   Medicare Wellness Visit for Marisa Jenkins       Plan for Treatment, Screening/Prevention  Well adult health check  (primary encounter diagnosis)    Screen for colon cancer  Plan: GASTROENTEROLOGY REFERRAL    Menopause  Plan: DEXA, COMPLETE    Generalized anxiety disorder  Plan: Diazepam (VALIUM) 5 mg Tablet    Benign essential hypertension  Plan: Comprehensive Metabolic Panel    Elevated glucose  Plan: Comprehensive Metabolic Panel, Hemoglobin A1C    Vitamin D deficiency    Postmenopausal atrophic vaginitis  Plan: Estradiol 10 mcg Tablet    Chronic pain of left knee  Plan: KNEE 3 VIEWS, LEFT        Plan of Care Discussed With:   the patient  Marisa Jenkins is a 67yr old female is here for her Medicare wellness visit.       Also  complains of  needs fasting blood tests   Enter/Edit History         Last pap smear test 2021 and was all normal,    Last mammogram has schedule          last bone density test/ dexa scan 2019   osteopenia  Last colonoscopy 2020 due.   Health Maintenance  ACP Documents in the EHR were reviewed; they are not up-to-date. The patient / surrogate was given information about how to update and file up-to-date documents.       Functional Status:   ADL/IADL Screening    Fall risks Assessment:   Fall Risk Screening  Fall risks Assessment:   No falls in past year. No throw rugs, poorly lighted areas, missing rails/grab bars in the home. No medications or conditions that affect stability or mobility.      Advance Care Planning:   The patient /  surrogate was counseled about the importance of Advance Care Planning. In addition:  There are no ACP documents in / surrogate the EHR. The patient was given information about how to create and file ACP documents  husband Homero Fellers is her health care   Advanced Care Planning Documents  Last labs December 27, 2021 .      Vaccines:     Pneumonia vaccine due for Prevnar 20, declines for  today   Respiratory syncytial virus vaccine will due in the fall     Last eye exam July, does annually     Health History  I reviewed and appropriately updated her lists of current problems, past medical/surgical history, and social history.     Lifestyle:   Diet: Mediterranean-style,     and has a moderate regular exercise program.    She reports that she has never  smoked.   She has never used smokeless tobacco.   She  reports current alcohol use of about 7 standard drinks of alcohol per week.   She  reports that she does not use drugs.    Mood disorder screening:   PHQ2/PHQ9 Screening      12/17/2020     8:50 AM   PHQ-2   Interest 0   Feeling 0   PHQ-2 Total Score 0      Cognitive screen:  Normal                             MME    REVIEW of SYSTEMS   As  per history of the present illness     Exam:    BP 128/75 (SITE: left arm, Orthostatic Position: sitting, Cuff Size: regular)   Pulse 69   Temp 36.1 C (97 F) (Temporal)   Resp 16   Ht 1.651 m (5\' 5" )   Wt 62.2 kg (137 lb 2 oz)   LMP 10/28/2010   SpO2 99%   BMI 22.82 kg/m      General Appearance: healthy,  alert, no distress, pleasant affect, cooperative.  Eyes:  conjunctivae and corneas clear. PERRL, EOM's intact. sclerae normal.  Ears:  normal TMs and canal and external inspection of ears show no abnormality.  Nose:  normal.  Mouth: normal.  Neck:  Neck supple. No adenopathy, thyroid symmetric, normal size.  Heart:  normal rate and regular rhythm, no murmurs, clicks, or gallops.  Lungs: clear to auscultation.  Breast:  inspection negative. No nipple discharge, bleeding or masses.Abdomen: BS normal.  Abdomen soft, non-tender.  No masses or organomegaly.  Extremities:  no cyanosis, clubbing, or edema.  Skin:  Skin color, texture, turgor normal. No rashes or suspicious skin lesions     Neuro: Gait normal. Reflexes normal and symmetric. Sensation and strength grossly normal.  Musculoskeletal: normal reflexes, symmetric muscular strength, full range of  motion of all extremities    Lab Results   Lab Name Value Date/Time    NA 143 08/23/2021 09:04 AM    NA 138 01/25/2015 09:19 AM    K 3.4 08/23/2021 09:04 AM    K 4.5 01/25/2015 09:19 AM    CL 100 08/23/2021 09:04 AM    CL 103 01/25/2015 09:19 AM    CO2 29 08/23/2021 09:04 AM    CO2 25 01/25/2015 09:19 AM    BUN 20 08/23/2021 09:04 AM    BUN 20 01/25/2015 09:19 AM    CR 0.73 08/23/2021 09:04 AM    CR 0.66 01/25/2015 09:19 AM    GLU 106 08/23/2021 09:04 AM    GLU 115 (H) 01/25/2015 09:19 AM      Lab Results   Lab Name Value Date/Time    HGBA1C 5.8 (H) 05/30/2020 10:38 AM    HGBA1C 5.8 (H) 06/17/2019 11:25 AM    HGBA1C 5.7 (H) 05/11/2018 07:45 AM    HGBA1C 5.7 (H) 05/10/2015 07:54 AM    HGBA1C 5.7 (H) 01/25/2015 09:19 AM    HGBA1C 5.5 07/03/2014 08:34 AM      Lab Results   Lab Name Value Date/Time    CHOL 222 (H) 08/23/2021 09:04 AM    CHOL 211 (H) 01/25/2015 09:19 AM    LDLC 121 (H) 08/23/2021 09:04 AM    LDLC 127 01/25/2015 09:19 AM    HDL 57 08/23/2021 09:04 AM    HDL 67 01/25/2015 09:19 AM    TRIG 218 (H) 08/23/2021 09:04 AM    TRIG 85 01/25/2015 09:19 AM      Lab Results   Lab Name Value Date/Time    HGBA1C 5.8 (H) 05/30/2020 10:38 AM    HGBA1C 5.8 (H) 06/17/2019 11:25 AM    HGBA1C 5.7 (H) 05/11/2018 07:45 AM    HGBA1C 5.7 (H) 05/10/2015 07:54 AM    HGBA1C 5.7 (H) 01/25/2015 09:19 AM    HGBA1C 5.5 07/03/2014 08:34 AM        Labs  Imaging,  Education Plan:  See information in the Patient Instructions section      Barriers to Learning assessed: none   Patient verbalizes understanding of teaching and instructions.  This document has been created using Dragon Naturally Speaking and has been checked for content. Some voice recognition errors may have been missed inadvertantly.Please note any grammatical, sound-alike, or syntax errors as likely dictation errors.     Marisa Jenkins If you are reviewing this progress note and have questions about the meaning or medical terms being used, please schedule an appointment or  bring it up at your next follow-up appointment.  Medical notes are meant to be a communication tool between medical professionals and require medical terms to be used for efficiency. You can also look up terms via on-line medical dictionaries such as Medline Plus at   MuscleTreatments.it.html   Electronically signed by:  Freddie Breech, MD  Family Practice, [facility]  [place] [facility]

## 2022-08-14 ENCOUNTER — Ambulatory Visit: Payer: Medicare Other | Admitting: Family Medicine

## 2022-08-14 ENCOUNTER — Encounter: Payer: Self-pay | Admitting: Family Medicine

## 2022-08-14 ENCOUNTER — Other Ambulatory Visit: Payer: Self-pay

## 2022-08-14 VITALS — BP 128/75 | HR 69 | Temp 97.0°F | Resp 16 | Ht 65.0 in | Wt 137.1 lb

## 2022-08-14 DIAGNOSIS — M25562 Pain in left knee: Secondary | ICD-10-CM

## 2022-08-14 DIAGNOSIS — Z780 Asymptomatic menopausal state: Secondary | ICD-10-CM

## 2022-08-14 DIAGNOSIS — I10 Essential (primary) hypertension: Secondary | ICD-10-CM

## 2022-08-14 DIAGNOSIS — N952 Postmenopausal atrophic vaginitis: Secondary | ICD-10-CM

## 2022-08-14 DIAGNOSIS — G8929 Other chronic pain: Secondary | ICD-10-CM

## 2022-08-14 DIAGNOSIS — R7309 Other abnormal glucose: Secondary | ICD-10-CM

## 2022-08-14 DIAGNOSIS — F411 Generalized anxiety disorder: Secondary | ICD-10-CM

## 2022-08-14 DIAGNOSIS — E559 Vitamin D deficiency, unspecified: Secondary | ICD-10-CM

## 2022-08-14 DIAGNOSIS — Z1211 Encounter for screening for malignant neoplasm of colon: Secondary | ICD-10-CM

## 2022-08-14 DIAGNOSIS — Z0000 Encounter for general adult medical examination without abnormal findings: Secondary | ICD-10-CM

## 2022-08-14 MED ORDER — DIAZEPAM 5 MG TABLET
5.0000 mg | ORAL_TABLET | Freq: Every day | ORAL | 0 refills | Status: DC | PRN
  Filled 2022-08-14: qty 30, 30d supply, fill #0

## 2022-08-14 MED ORDER — ESTRADIOL 10 MCG VAGINAL TABLET: 10.0000 ug | ORAL_TABLET | VAGINAL | 3 refills | Status: DC

## 2022-08-14 NOTE — Nursing Note (Signed)
Vital signs taken,tobacco,allergies,pharmacy verified, screened for pain.  Deshunda Thackston, MA

## 2022-08-14 NOTE — Patient Instructions (Addendum)
Number for Marisa Jenkins radiology: [phone]     Can take vitamin D up to 1000 IU per day but don't take more than that.

## 2022-08-15 ENCOUNTER — Encounter: Payer: Self-pay | Admitting: Family Medicine

## 2022-08-26 ENCOUNTER — Ambulatory Visit (INDEPENDENT_AMBULATORY_CARE_PROVIDER_SITE_OTHER): Payer: Medicare Other

## 2022-08-26 ENCOUNTER — Ambulatory Visit
Admission: RE | Admit: 2022-08-26 | Discharge: 2022-08-26 | Disposition: A | Discharge status: Home / Self Care | Payer: Medicare Other | Source: Ambulatory Visit | Attending: Family Medicine | Admitting: Family Medicine

## 2022-08-26 DIAGNOSIS — R7309 Other abnormal glucose: Secondary | ICD-10-CM | POA: Insufficient documentation

## 2022-08-26 DIAGNOSIS — G8929 Other chronic pain: Secondary | ICD-10-CM | POA: Insufficient documentation

## 2022-08-26 DIAGNOSIS — I10 Essential (primary) hypertension: Secondary | ICD-10-CM | POA: Insufficient documentation

## 2022-08-26 DIAGNOSIS — M25562 Pain in left knee: Secondary | ICD-10-CM | POA: Insufficient documentation

## 2022-08-26 LAB — COMPREHENSIVE METABOLIC PANEL
Alanine Transferase (ALT): 32 U/L (ref <=33)
Albumin: 4.8 g/dL (ref 4.0–4.9)
Alkaline Phosphatase (ALP): 90 U/L (ref 35–129)
Anion Gap: 12 mmol/L (ref 7–15)
Aspartate Transaminase (AST): 32 U/L (ref <=41)
Bilirubin Total: 0.7 mg/dL (ref <=1.2)
Calcium: 10 mg/dL (ref 8.6–10.0)
Carbon Dioxide Total: 30 mmol/L — ABNORMAL HIGH (ref 22–29)
Chloride: 97 mmol/L — ABNORMAL LOW (ref 98–107)
Creatinine Serum: 0.67 mg/dL (ref 0.51–1.17)
E-GFR Creatinine (Female): 96 mL/min/{1.73_m2}
Glucose: 118 mg/dL — ABNORMAL HIGH (ref 74–109)
Potassium: 4.2 mmol/L (ref 3.4–5.1)
Protein: 8.2 g/dL (ref 6.6–8.7)
Sodium: 139 mmol/L (ref 136–145)
Urea Nitrogen, Blood (BUN): 19 mg/dL (ref 6–20)

## 2022-08-26 LAB — HEMOGLOBIN A1C
Hgb A1C,Glucose Est Avg: 128 mg/dL
Hgb A1C: 6.1 % — ABNORMAL HIGH (ref 3.9–5.6)

## 2022-09-08 ENCOUNTER — Encounter: Payer: Self-pay | Admitting: Family Medicine

## 2022-09-08 NOTE — Addendum Note (Signed)
Addended by: Barbaraann Rondo on: 09/08/2022 08:32 PM     Modules accepted: Orders

## 2022-09-09 NOTE — Telephone Encounter (Signed)
*  Addressed, encounter closed.

## 2022-09-19 ENCOUNTER — Other Ambulatory Visit: Payer: Self-pay | Admitting: Family Medicine

## 2022-09-19 DIAGNOSIS — I10 Essential (primary) hypertension: Secondary | ICD-10-CM

## 2022-09-23 NOTE — Telephone Encounter (Signed)
Pharmacy Refill Optimization (PRO)    Refill authorized per P&T Protocol 09/23/2022  .   Meets Epic Protocol: YES, as determined by Epic (See Protocol Details for specific information)      ====================================================================    Medication name: HYDROCHLOROTHIAZIDE  Labs (if required by protocol):   Lab Results   Component Value Date    NA 139 08/26/2022    NA 143 08/23/2021    K 4.2 08/26/2022    K 3.4 08/23/2021    CA 10.0 08/26/2022    CA 9.3 08/23/2021    CR 0.67 08/26/2022    CR 0.73 08/23/2021    GFR 96 08/26/2022    GFR 91 08/23/2021    BUN 19 08/26/2022    BUN 20 08/23/2021

## 2022-09-25 ENCOUNTER — Ambulatory Visit
Admission: RE | Admit: 2022-09-25 | Discharge: 2022-09-25 | Disposition: A | Discharge status: Home / Self Care | Payer: Medicare Other | Source: Ambulatory Visit | Attending: Family Medicine | Admitting: Family Medicine

## 2022-09-25 DIAGNOSIS — Z1231 Encounter for screening mammogram for malignant neoplasm of breast: Secondary | ICD-10-CM

## 2022-09-28 ENCOUNTER — Other Ambulatory Visit: Payer: Self-pay | Admitting: Family Medicine

## 2022-09-28 DIAGNOSIS — I10 Essential (primary) hypertension: Secondary | ICD-10-CM

## 2022-10-01 NOTE — Telephone Encounter (Signed)
Pharmacy Refill Optimization (PRO)    Refill authorized per P&T Protocol 10/01/2022  .   Meets Epic Protocol: YES, as determined by Epic (See Protocol Details for specific information)      ====================================================================    Medication name: METOPROLOL  Labs (if required by protocol): N/A

## 2022-10-13 ENCOUNTER — Other Ambulatory Visit: Payer: Self-pay | Admitting: Family Medicine

## 2022-10-13 DIAGNOSIS — K279 Peptic ulcer, site unspecified, unspecified as acute or chronic, without hemorrhage or perforation: Secondary | ICD-10-CM

## 2022-10-17 NOTE — Telephone Encounter (Signed)
Pharmacy Refill Optimization (PRO)    Per PRO Protocol, approval or denial will be determined by the provider     Bypassing Epic Protocol Determination (See Protocol Details for specific information)     Meets PRO P&T Approved Protocol: NO - Medication not reviewed/reconciled in past 12 months   AWV 08/14/22    ====================================================================    Medication name: famotidine   Labs (if required by protocol): N/A

## 2022-11-19 ENCOUNTER — Encounter: Payer: Self-pay | Admitting: Family Medicine

## 2022-11-19 DIAGNOSIS — N952 Postmenopausal atrophic vaginitis: Secondary | ICD-10-CM

## 2022-11-25 NOTE — Telephone Encounter (Signed)
Pharmacy Refill Optimization (PRO)    REFILL REQUEST ALREADY RESPONDED TO BY OTHER MEANS     Prescription renewal addressed on 08/15/22, 3 month supply plus 3 refills [facility]    Refill Request denied as REFILL TOO SOON per PRO Workflow 11/25/2022    Patient informed via MyChart.

## 2023-03-12 ENCOUNTER — Ambulatory Visit
Admission: RE | Admit: 2023-03-12 | Discharge: 2023-03-12 | Disposition: A | Discharge status: Home / Self Care | Payer: Medicare Other | Source: Ambulatory Visit | Attending: Family Medicine | Admitting: Family Medicine

## 2023-03-12 ENCOUNTER — Ambulatory Visit: Payer: Self-pay | Admitting: Family Medicine

## 2023-03-12 ENCOUNTER — Ambulatory Visit: Payer: Medicare Other | Admitting: Family Medicine

## 2023-03-12 ENCOUNTER — Ambulatory Visit (INDEPENDENT_AMBULATORY_CARE_PROVIDER_SITE_OTHER): Payer: Medicare Other

## 2023-03-12 ENCOUNTER — Other Ambulatory Visit: Payer: Self-pay

## 2023-03-12 VITALS — BP 120/75 | HR 62 | Temp 98.0°F | Wt 145.5 lb

## 2023-03-12 DIAGNOSIS — E7800 Pure hypercholesterolemia, unspecified: Secondary | ICD-10-CM

## 2023-03-12 DIAGNOSIS — S299XXA Unspecified injury of thorax, initial encounter: Secondary | ICD-10-CM

## 2023-03-12 DIAGNOSIS — E559 Vitamin D deficiency, unspecified: Secondary | ICD-10-CM

## 2023-03-12 DIAGNOSIS — R0782 Intercostal pain: Secondary | ICD-10-CM

## 2023-03-12 DIAGNOSIS — K760 Fatty (change of) liver, not elsewhere classified: Secondary | ICD-10-CM

## 2023-03-12 DIAGNOSIS — R7303 Prediabetes: Secondary | ICD-10-CM

## 2023-03-12 DIAGNOSIS — M8000XA Age-related osteoporosis with current pathological fracture, unspecified site, initial encounter for fracture: Secondary | ICD-10-CM

## 2023-03-12 DIAGNOSIS — S2242XA Multiple fractures of ribs, left side, initial encounter for closed fracture: Secondary | ICD-10-CM | POA: Insufficient documentation

## 2023-03-12 LAB — HEPATIC FUNCTION PANEL
Alanine Transferase (ALT): 38 U/L — ABNORMAL HIGH (ref <=33)
Albumin: 4.7 g/dL (ref 4.0–4.9)
Alkaline Phosphatase (ALP): 100 U/L (ref 35–129)
Aspartate Transaminase (AST): 35 U/L (ref <=41)
Bilirubin Direct: 0.2 mg/dL (ref 0.0–0.3)
Bilirubin Total: 0.7 mg/dL (ref <=1.2)
Protein: 8 g/dL (ref 6.6–8.7)

## 2023-03-12 LAB — BASIC METABOLIC PANEL
Anion Gap: 10 mmol/L (ref 7–15)
Calcium: 9.8 mg/dL (ref 8.6–10.0)
Carbon Dioxide Total: 30 mmol/L — ABNORMAL HIGH (ref 22–29)
Chloride: 98 mmol/L (ref 98–107)
Creatinine Serum: 0.72 mg/dL (ref 0.51–1.17)
E-GFR Creatinine (Female): 92 mL/min/{1.73_m2}
Glucose: 94 mg/dL (ref 74–109)
Potassium: 4.3 mmol/L (ref 3.4–5.1)
Sodium: 138 mmol/L (ref 136–145)
Urea Nitrogen, Blood (BUN): 30 mg/dL — ABNORMAL HIGH (ref 6–20)

## 2023-03-12 LAB — VITAMIN D, 25 HYDROXY: Vitamin D, 25 Hydroxy: 25.3 ng/mL (ref 10.0–50.0)

## 2023-03-12 MED ORDER — ALENDRONATE 70 MG TABLET
70.0000 mg | ORAL_TABLET | ORAL | 12 refills | Status: DC
  Filled 2023-03-12: qty 4, 28d supply, fill #0
  Filled 2023-04-20: qty 4, 28d supply, fill #1

## 2023-03-12 MED ORDER — TRAMADOL 50 MG TABLET
50.0000 mg | ORAL_TABLET | Freq: Four times a day (QID) | ORAL | 0 refills | Status: AC | PRN
  Filled 2023-03-12: qty 28, 7d supply, fill #0

## 2023-03-12 NOTE — Progress Notes (Signed)
EPIC FAMILY MEDICINE VISIT:  Primary care physician is Kerri Perches, MD     ASSESSMENT & PLAN        I obtained verbal consent from the patient to use AI ambient technology to transcribe the interactions between the patient and myself during the clinical encounter.       Diagnoses and all orders for this visit:  Rib injury  -     RIBS UNILATERAL WITH PA CHEST, LEFT; Future  -     Tramadol (ULTRAM) 50 mg Tablet; Take 1 tablet by mouth every 6 hours if needed for pain.  Age-related osteoporosis with current pathological fracture, initial encounter  Prediabetes  -     Hemoglobin A1C; Future  -     Basic Metabolic Panel; Future  Vitamin D deficiency  -     Vitamin D, 25 Hydroxy; Future  Pure hypercholesterolemia  -     Lipid Panel; Future  Closed fracture of multiple ribs of left side, initial encounter  Other orders  -     Alendronate (FOSAMAX) 70 mg Tablet; Take 1 tablet by mouth one time each week. Take with water on empty stomach, 30 minutes before food or lying down    Assessment & Plan  Rib Fracture  Painful rib injury sustained around 03/05/2023, likely involving two ribs. No shortness of breath, but pain with deep breaths, coughing, and sneezing.  -Order rib x-ray to confirm diagnosis and assess severity.  -Prescribe Tramadol for pain control, with caution regarding potential interactions with alcohol. Avoid any twisting motion,  take deep breaths every hour while awake to prevent pneumonia     Osteoporosis  DEXA scan shows osteoporosis in the spine and both thigh bones. Patient has been taking Vitamin D 1000 IU daily.  -Check Vitamin D level.  Reviewed in detail treatment options available for osteoporosis  -If Vitamin D level is normal, start Fosamax once weekly for osteoporosis treatment, with instructions to remain upright after taking and to take on an empty stomach.  -Advise patient to discuss starting Fosamax with dentist due to potential dental implications.    Hyperglycemia  Last A1C in June  showed increasing levels.  -Order Hemoglobin A1C to monitor for potential diabetes.     Call back or message if further questions, new or worsening problems       Return to clinic: 2 months if not improved,  and sooner if symptoms fail to improve as expected or worsen.   SUBJECTIVE      Chief Complaint   Patient presents with    Back Pain     Left ribcage          SUBJECTIVE:  Marisa Jenkins is a 68yr old female who presents for an in person visit for   A new problem of left rib pain.  Son tried to adjust her back due to thoracic and low back pain and accidentally hurt left ribs.  Felt sharp pain and a pop in her left ribs.  Has continued severe pain in the area.  No shortness of breath.     History of Present Illness  The patient presents with left rib pain following an accidental injury during a chiropractic adjustment. The incident occurred after Christmas but before New Year's, and the pain has not improved since. The patient describes the pain as severe, constant, and exacerbated by sneezing, coughing, and movement. She has been using a brace and ice for relief.   The patient also is here  to review her bone density test/ dexa scan result which shows osteoporosis, which was diagnosed following a DEXA scan. She has been managing the pain with ibuprofen, taken twice daily, but expresses concern about potential liver damage due to a history of fatty liver disease.         12/17/2020     8:50 AM   PHQ-2   Interest 0    Feeling 0    PHQ-2 Total Score 0       Patient-reported        OBJECTIVE      Vital signs:    BP 120/75 (SITE: right arm, Orthostatic Position: sitting, Cuff Size: regular)   Pulse 62   Temp 36.7 C (98 F)   Wt 66 kg (145 lb 8.1 oz)   LMP 10/28/2010   BMI 24.21 kg/m    General Appearance: healthy, alert, no distress, pleasant affect, cooperative.  Eyes:  conjunctivae and corneas clear. PERRL, EOM's intact. sclerae normal.  Nose:  normal.  Neck:  Neck supple. No adenopathy, no carotid bruit    Heart:  normal rate and regular rhythm, no murmurs, clicks, or gallops.  peripheral vascular exam both carotids normal upstroke without bruits, pedal pulses normal both DP's and PT's  Extremities:  no cyanosis, clubbing, or edema and distal pulses normal.  Mental Status: complains of  problems sleeping due to bad rib pain  Musculoskeletal: exquisitely tender left lateral ribs x 2 just below the bra line, with step off noted in both ribs on lateral chest   Physical Exam  CHEST: Tenderness and step-off noted at left rib cage, indicating possible fractures. Pain on deep inspiration.  SKIN: No bruising observed at site of injury.     Labs reviewed:   Lab Results   Lab Name Value Date/Time    NA 138 03/12/2023 03:26 PM    NA 138 01/25/2015 09:19 AM    K 4.3 03/12/2023 03:26 PM    K 4.5 01/25/2015 09:19 AM    CL 98 03/12/2023 03:26 PM    CL 103 01/25/2015 09:19 AM    CO2 30 (H) 03/12/2023 03:26 PM    CO2 25 01/25/2015 09:19 AM    BUN 30 (H) 03/12/2023 03:26 PM    BUN 20 01/25/2015 09:19 AM    CR 0.72 03/12/2023 03:26 PM    CR 0.66 01/25/2015 09:19 AM    GLU 94 03/12/2023 03:26 PM    GLU 115 (H) 01/25/2015 09:19 AM      Lab Results   Lab Name Value Date/Time    CHOL 222 (H) 08/23/2021 09:04 AM    CHOL 211 (H) 01/25/2015 09:19 AM    LDLC 121 (H) 08/23/2021 09:04 AM    LDLC 127 01/25/2015 09:19 AM    HDL 57 08/23/2021 09:04 AM    HDL 67 01/25/2015 09:19 AM    TRIG 218 (H) 08/23/2021 09:04 AM    TRIG 85 01/25/2015 09:19 AM      Lab Results   Lab Name Value Date/Time    HGBA1C 6.1 (H) 08/26/2022 10:41 AM    HGBA1C 5.8 (H) 05/30/2020 10:38 AM    HGBA1C 5.8 (H) 06/17/2019 11:25 AM    HGBA1C 5.7 (H) 05/10/2015 07:54 AM    HGBA1C 5.7 (H) 01/25/2015 09:19 AM    HGBA1C 5.5 07/03/2014 08:34 AM      Lab Results   Lab Name Value Date/Time    WBC 4.9 05/11/2018 07:45 AM    WBC 4.8 06/28/2015 07:59 AM  HGB 13.7 05/11/2018 07:45 AM    HGB 14.1 06/28/2015 07:59 AM    HCT 41.1 05/11/2018 07:45 AM    HCT 41.1 06/28/2015 07:59 AM    PLT  223 05/11/2018 07:45 AM    PLT 219 06/28/2015 07:59 AM      Lab Results   Lab Name Value Date/Time    HGBA1C 6.1 (H) 08/26/2022 10:41 AM    HGBA1C 5.8 (H) 05/30/2020 10:38 AM    HGBA1C 5.8 (H) 06/17/2019 11:25 AM    HGBA1C 5.7 (H) 05/10/2015 07:54 AM    HGBA1C 5.7 (H) 01/25/2015 09:19 AM    HGBA1C 5.5 07/03/2014 08:34 AM      Lab Results   Lab Name Value Date/Time    TSH 1.72 06/28/2013 02:12 PM   ]  No components found for: "T4"]      Results  DIAGNOSTIC  DEXA scan: T-score: -2.9 (spine), -2.3 (left femur)     Total time I spent in care of this patient for this visit (excluding time spent on other billable services) was 30 minutes. about the above diagnoses/ health problems    Barriers to Learning assessed: none.  Patient verbalizes understanding of teaching and instructions and agrees with the plan of care.   I reviewed the patient's past medical and family/social history, and updated rpobh .    This document has been created using Dragon Naturally Speaking and has been checked for content. Some voice recognition errors may have been missed inadvertantly.Please note any grammatical, sound-alike, or syntax errors as likely dictation errors.     [name] If you are reviewing this progress note and have questions about the meaning or medical terms being used, please schedule an appointment or bring it up at your next follow-up appointment.  Medical notes are meant to be a communication tool between medical professionals and require medical terms to be used for efficiency. You can also look up terms via on-line medical dictionaries such as Medline Plus at   LunchLookup.de.      Electronically signed by:  Freddie Breech, MD

## 2023-03-12 NOTE — Telephone Encounter (Signed)
Emergency Advice / Priscille Loveless:    Reason for Call:  Patient experiencing symptoms of possible fractured rib .  BATLINE CALL transferred to Nursing Triage back line.    [[facility]:  Please send Emergency message High priority to P PCN NURSE TRIAGE [[id number]], warm transfer call to advice nurse backline Ext: [phone]].        Hulan Amato, [facility]

## 2023-03-12 NOTE — Telephone Encounter (Signed)
3 patient identifiers used.  Per:   patient.    Disposition: SEE HCP (OR PCP TRIAGE) WITHIN 4 HOURS   Appointment given per protocol   Future Appointments   Date Time Provider Department Center   03/12/2023  2:30 PM Kerri Perches, MD [facility] [place]   Per:   patient verbalizes agreement to plan. Agrees to callback with any increase in symptoms/concerns or questions.    See Assessment Below  Patient had her back adjusted by her son a week ago. She hear a pop and thinks she has fractured her left ribs. She is taking Ibuprofen 800 mg every 8 hours. She also applying heating pad.It hurts with breathing or laughing.  Advise to call back for new or worsening symptoms.       See Care Advice Below  Care Advice            Chest Injury - Bending, Lifting, or Twisting-ADULT-AH  Wynona Neat, RN Thu Mar 12, 2023 09:37 AM      Care Advice       SEE HCP (OR PCP TRIAGE) WITHIN 4 HOURS:  * IF OFFICE WILL BE OPEN: You need to be seen within the next 3 or 4 hours. Call your doctor (or NP/PA) now or as soon as the office opens.  * IF OFFICE WILL BE CLOSED AND NO PCP (PRIMARY CARE PROVIDER) SECOND-LEVEL TRIAGE: You need to be seen within the next 3 or 4 hours. A nearby Urgent Care Center [facility]) is often a good source of care. Another choice is to go to the ED. Go sooner if you become worse.  * IF OFFICE WILL BE CLOSED AND PCP SECOND-LEVEL TRIAGE REQUIRED: You may need to be seen. Your doctor (or NP/PA) will want to talk with you to decide what's best. I'll page the on-call provider now. If you haven't heard from the provider (or me) within 30 minutes, call again. NOTE: If on-call provider can't be reached, send to [facility] or ED.    NOTE TO TRIAGER:  * Use nurse judgment to select the most appropriate source of care.  * Consider both the urgency of the patient's symptoms AND what resources may be needed to evaluate and manage the patient.    SOURCES OF CARE:  * ED: Patients who may need surgery or hospital admission need to be sent to an  ED. So do most patients with serious symptoms or complex medical problems.  * UCC: Some UCCs can manage patients who are stable and have less serious symptoms (e.g., minor illnesses and injuries). The triager must know the [facility] capabilities before sending a patient there. If unsure, call ahead.  * OFFICE: If patient sounds stable and not seriously ill, consult PCP (or follow your office policy) to see if patient can be seen NOW in office.    CALL BACK IF:  * You become worse                            See Protocol and Disposition Below    Reason for Disposition   [1] MODERATE pain (e.g., interferes with normal activities) AND [2] pain is WORSE with breathing    Additional Information   Negative: Sounds like a life-threatening emergency to the triager   Negative: Sounds like a serious injury to the triager   Negative: SEVERE chest or rib pain (e.g., excruciating, unable to do any normal activities)    Protocols used: Chest Injury -  Bending, Lifting, or Twisting-ADULT-AH      Andi Devon, RN- BSN  [facility] Triage Advice Nurse

## 2023-03-12 NOTE — Nursing Note (Signed)
Vital signs taken, allergies verified, screened for pain and chief complaint noted. Rayce Brahmbhatt MAII

## 2023-03-13 ENCOUNTER — Encounter: Payer: Self-pay | Admitting: Family Medicine

## 2023-03-13 DIAGNOSIS — S2242XA Multiple fractures of ribs, left side, initial encounter for closed fracture: Secondary | ICD-10-CM | POA: Insufficient documentation

## 2023-03-13 DIAGNOSIS — M8000XA Age-related osteoporosis with current pathological fracture, unspecified site, initial encounter for fracture: Secondary | ICD-10-CM | POA: Insufficient documentation

## 2023-03-13 LAB — HEMOGLOBIN A1C
Hgb A1C,Glucose Est Avg: 128 mg/dL
Hgb A1C: 6.1 % — ABNORMAL HIGH (ref 3.9–5.6)

## 2023-04-15 ENCOUNTER — Other Ambulatory Visit: Payer: Self-pay | Admitting: Family Medicine

## 2023-04-15 DIAGNOSIS — N952 Postmenopausal atrophic vaginitis: Secondary | ICD-10-CM

## 2023-04-16 NOTE — Telephone Encounter (Signed)
Pharmacy Refill Optimization (PRO)    Refill authorized per P&T Protocol 04/16/2023    Meets PRO P&T Approved Protocol: YES     ====================================================================    Provider: Kerri Perches, MD  Next appointment: 0   Last appointment: 03/12/2023  Medication Name:   Requested Prescriptions     Pending Prescriptions Disp Refills    Estradiol 10 mcg Tablet [Pharmacy Med Name: Estradiol 10 MCG Vaginal Tablet] 40 tablet 3     Sig: PLACE 1 INSERT VAGINALLY 3 TIMES WEEKLY ON MONDAY, WEDNESDAY, AND FRIDAY     Last prescribed date per EMR: 08/15/2022 #36 R3  Med last filled: 02/07/2023 #40/90  Med last reviewed/reconciled: 03/12/2023  Labs (if required by protocol): N/A

## 2023-04-20 ENCOUNTER — Other Ambulatory Visit: Payer: Self-pay

## 2023-04-23 ENCOUNTER — Other Ambulatory Visit: Payer: Self-pay

## 2023-05-21 ENCOUNTER — Ambulatory Visit: Payer: Self-pay | Admitting: Family Medicine

## 2023-05-21 NOTE — Telephone Encounter (Signed)
 3 patient identifiers used.  Per:   patient.    Disposition: SEE PCP WITHIN 2 WEEKS   Advice given per protocol  Appointment given per protocol   Date & Time  05/22/2023 11:00 AM Provider  Kerri Perches, MD Department  [facility]/Internal Medicine     Per:   patient verbalizes agreement to plan. Agrees to callback with any increase in symptoms/concerns or questions.    See Assessment Below  Right breast nipple pain started yesterday while emptying the washer/dryer. Has had pain since, and worse with palpation. Developed erythema around and under nipple since this morning about one or less inch. Denies fever.    See Care Advice Below  Care Advice            Breast Symptoms-ADULT-AH  Consuelo Pandy, RN Thu May 21, 2023 03:43 PM      Care Advice       SEE PCP WITHIN 2 WEEKS:  * You need to be seen for this ongoing problem within the next 2 weeks.  * PCP VISIT: Call your doctor (or NP/PA) during regular office hours and make an appointment.  * IF PATIENT HAS NO PCP: A primary care clinic is where you need to be seen for chronic health problems. NOTE: Try to help caller find a PCP (e.g., use a physician referral line). Having a PCP or 'medical home' means better long-term care.    CALL BACK IF:  * Fever or redness occurs  * You become worse    CARE ADVICE given per Breast Symptoms (Adult) guideline.                            See Protocol and Disposition Below  Reason for Disposition   [1] Breast pain AND [2] cause is not known    Additional Information   Negative: [1] SEVERE breast pain AND [2] fever > 103 F (39.4 C)   Negative: Patient sounds very sick or weak to the triager   Negative: [1] Breast looks infected (spreading redness, feels hot or painful to touch) AND [2] fever   Negative: [1] Painful rash AND [2] multiple small blisters grouped together (i.e., dermatomal distribution or "band" or "stripe")   Negative: [1] Cuts, burns, or bruises of breasts AND [2] suspicious history for the injury    Negative: [1] Breast looks infected (spreading redness, feels hot or painful to touch) AND [2] no fever   Negative: Breast lump   Negative: [1] Nipple discharge AND [2] bloody   Negative: Nipple is inverted (i.e., points inward)  (Exception: Long-term physical characteristic, present for many years.)   Negative: Dry flaking-peeling skin of nipple   Negative: Change in shape or appearance of breast   Negative: Dimpling of skin of breast (i.e., skin looks like the outside of an [place] peel)   Negative: [1] Breast tenderness and fullness AND [2] pregnant   Negative: [1] Nipple discharge AND [2] not bloody (e.g., clear, white, yellow, brown, green)   Negative: [1] Breast pain or tenderness AND [2] occurs monthly before menstrual period AND [3] has NOT been evaluated by a doctor (or NP/PA)    Protocols used: Breast Symptoms-ADULT-AH    Guss Bunde, RN  PCN Triage

## 2023-05-22 ENCOUNTER — Ambulatory Visit: Admitting: Family Medicine

## 2023-05-22 VITALS — BP 157/79 | HR 82 | Temp 97.2°F | Wt 143.7 lb

## 2023-05-22 DIAGNOSIS — N6315 Unspecified lump in the right breast, overlapping quadrants: Secondary | ICD-10-CM

## 2023-05-22 DIAGNOSIS — E7800 Pure hypercholesterolemia, unspecified: Secondary | ICD-10-CM

## 2023-05-22 DIAGNOSIS — K760 Fatty (change of) liver, not elsewhere classified: Secondary | ICD-10-CM

## 2023-05-22 DIAGNOSIS — N644 Mastodynia: Secondary | ICD-10-CM

## 2023-05-22 MED ORDER — ROSUVASTATIN 5 MG TABLET: 5.0000 mg | ORAL_TABLET | Freq: Every day | ORAL | 3 refills | Status: DC

## 2023-05-22 NOTE — Patient Instructions (Signed)
 To schedule your mammogram, call   [phone]  Freddie Breech, MD    VISIT SUMMARY:  Today, we discussed your recent breast pain and redness, your history of high cholesterol, and your nonalcoholic fatty liver disease (NAFLD). We have planned further tests and adjusted your medications to better manage your conditions.    YOUR PLAN:  -MASTALGIA WITH BREAST LUMP: Mastalgia refers to breast pain, and in your case, it is accompanied by swelling and thickening under the nipple. This could be due to a blocked duct, infection, breast cancer or cyst.  We need imaging to rule out cancer. We have ordered a diagnostic mammogram and you may also need a breast ultrasound. The radiologist will determine this at your visit.  Please monitor for any worsening symptoms, nipple discharge, or signs of shingles.  If the redness gets worse again, please let me know. Consider stopping the vaginal estradiol tablet while waiting for breast testing.     -HYPERCHOLESTEROLEMIA: Hypercholesterolemia means having high cholesterol levels, which can increase the risk of heart disease. We have started you on Crestor (rosuvastatin) 5 mg daily to help manage your cholesterol and reduce cardiovascular risk. We will check your liver function in one to two months and do a fasting lipid panel after one to two months as well.    -NONALCOHOLIC FATTY LIVER DISEASE (NAFLD): NAFLD is a condition where fat builds up in the liver, not due to alcohol use, and it can lead to liver damage. This condition is likely hereditary in your case. We will monitor your liver function tests as part of your hypercholesterolemia management and discuss the benefits of statin therapy, which may help reduce liver fat and improve liver function.    INSTRUCTIONS:  Please schedule a follow-up appointment after your breast imaging results are available. If you do not receive a call to schedule the imaging within two weeks, contact our clinic. Continue monitoring your symptoms and  contact us if you notice any changes or have further concerns.

## 2023-05-22 NOTE — Nursing Note (Signed)
 Vital signs taken, allergies verified, screened for pain and chief complaint noted. Joylene Draft MAII

## 2023-05-22 NOTE — Progress Notes (Signed)
 EPIC FAMILY MEDICINE VISIT:  Primary care physician is Kerri Perches, MD     ASSESSMENT & PLAN        I obtained verbal consent from the patient to use AI ambient technology to transcribe the interactions between the patient and myself during the clinical encounter.       There are no diagnoses linked to this encounter.  Assessment & Plan       Call back or message if further questions, new or worsening problems       Return to clinic: ***,  and sooner if symptoms fail to improve as expected or worsen.   SUBJECTIVE    No chief complaint on file.    ***    SUBJECTIVE:  Marisa Jenkins is a 68yr old female who presents for an in person visit for   Breast pain with redness on the right breast.  Right breast nipple pain started 2days ago while emptying the washer/dryer. Has had pain since, and worse with palpation. Developed erythema around and under nipple since this morning about one or less inch. Denies fever.  Last mammogram normal in July 2024.   History of Present Illness        Results Review(No need to delete link; will disappear on signing note):15640    Medications(No need to delete link; will disappear on signing note):15640    Loraine Leriche as Reviewed(No need to delete link; will disappear on signing note):15640   Advanced Care Planning(No need to delete link; will disappear on signing note):15640    Substance use History(No need to delete link; will disappear on signing note):15640   Fall Risk Screening(No need to delete link; will disappear on signing note):15640   PHQ2/PHQ9 Screening(No need to delete link; will disappear on signing note):15640      12/17/2020     8:50 AM   PHQ-2   Interest 0   Feeling 0   PHQ-2 Total Score 0       Health Maintenance(No need to delete link; will disappear on signing note):15640    OBJECTIVE      Vital signs:    LMP 10/28/2010     General Physical Assessment:10547   Physical Exam       Labs reviewed:   Lab Results   Lab Name Value Date/Time    NA 138 03/12/2023  03:26 PM    NA 138 01/25/2015 09:19 AM    K 4.3 03/12/2023 03:26 PM    K 4.5 01/25/2015 09:19 AM    CL 98 03/12/2023 03:26 PM    CL 103 01/25/2015 09:19 AM    CO2 30 (H) 03/12/2023 03:26 PM    CO2 25 01/25/2015 09:19 AM    BUN 30 (H) 03/12/2023 03:26 PM    BUN 20 01/25/2015 09:19 AM    CR 0.72 03/12/2023 03:26 PM    CR 0.66 01/25/2015 09:19 AM    GLU 94 03/12/2023 03:26 PM    GLU 115 (H) 01/25/2015 09:19 AM      Lab Results   Lab Name Value Date/Time    CHOL 222 (H) 08/23/2021 09:04 AM    CHOL 211 (H) 01/25/2015 09:19 AM    LDLC 121 (H) 08/23/2021 09:04 AM    LDLC 127 01/25/2015 09:19 AM    HDL 57 08/23/2021 09:04 AM    HDL 67 01/25/2015 09:19 AM    TRIG 218 (H) 08/23/2021 09:04 AM    TRIG 85 01/25/2015 09:19 AM      Lab  Results   Lab Name Value Date/Time    HGBA1C 6.1 (H) 03/12/2023 03:26 PM    HGBA1C 6.1 (H) 08/26/2022 10:41 AM    HGBA1C 5.8 (H) 05/30/2020 10:38 AM    HGBA1C 5.7 (H) 05/10/2015 07:54 AM    HGBA1C 5.7 (H) 01/25/2015 09:19 AM    HGBA1C 5.5 07/03/2014 08:34 AM      Lab Results   Lab Name Value Date/Time    WBC 4.9 05/11/2018 07:45 AM    WBC 4.8 06/28/2015 07:59 AM    HGB 13.7 05/11/2018 07:45 AM    HGB 14.1 06/28/2015 07:59 AM    HCT 41.1 05/11/2018 07:45 AM    HCT 41.1 06/28/2015 07:59 AM    PLT 223 05/11/2018 07:45 AM    PLT 219 06/28/2015 07:59 AM      Lab Results   Lab Name Value Date/Time    HGBA1C 6.1 (H) 03/12/2023 03:26 PM    HGBA1C 6.1 (H) 08/26/2022 10:41 AM    HGBA1C 5.8 (H) 05/30/2020 10:38 AM    HGBA1C 5.7 (H) 05/10/2015 07:54 AM    HGBA1C 5.7 (H) 01/25/2015 09:19 AM    HGBA1C 5.5 07/03/2014 08:34 AM      Lab Results   Lab Name Value Date/Time    TSH 1.72 06/28/2013 02:12 PM   ]  No components found for: "T4"]      Results       Total time I spent in care of this patient for this visit (excluding time spent on other billable services) was *** minutes. about the above diagnoses/ health problems    Barriers to Learning assessed: none. *** Patient verbalizes understanding of teaching and  instructions and agrees with the plan of care.    History Statement:10509    This document has been created using Dragon Naturally Speaking and has been checked for content. Some voice recognition errors may have been missed inadvertantly.Please note any grammatical, sound-alike, or syntax errors as likely dictation errors.     [name] If you are reviewing this progress note and have questions about the meaning or medical terms being used, please schedule an appointment or bring it up at your next follow-up appointment.  Medical notes are meant to be a communication tool between medical professionals and require medical terms to be used for efficiency. You can also look up terms via on-line medical dictionaries such as Medline Plus at   LunchLookup.de.      Electronically signed by:  Freddie Breech, MD

## 2023-05-23 ENCOUNTER — Encounter: Payer: Self-pay | Admitting: Family Medicine

## 2023-05-23 DIAGNOSIS — K760 Fatty (change of) liver, not elsewhere classified: Secondary | ICD-10-CM | POA: Insufficient documentation

## 2023-05-23 DIAGNOSIS — N6315 Unspecified lump in the right breast, overlapping quadrants: Secondary | ICD-10-CM | POA: Insufficient documentation

## 2023-05-26 ENCOUNTER — Other Ambulatory Visit: Payer: Self-pay | Admitting: Family Medicine

## 2023-05-26 NOTE — Telephone Encounter (Signed)
 Recent Visits  Date Type Provider Dept   05/22/23 Office Visit Kerri Perches, MD [facility]   03/12/23 Office Visit Kerri Perches, MD [facility]   08/14/22 Office Visit Kerri Perches, MD [facility]   Showing recent visits within past 540 days with a meds authorizing provider and meeting all other requirements  Future Appointments  No visits were found meeting these conditions.  Showing future appointments within next 150 days with a meds authorizing provider and meeting all other requirements

## 2023-05-26 NOTE — Telephone Encounter (Signed)
 Alendronate (FOSAMAX) 70 mg Tablet   90 day supply

## 2023-05-27 MED ORDER — ALENDRONATE 70 MG TABLET: 70.0000 mg | ORAL_TABLET | ORAL | 3 refills | Status: DC

## 2023-06-30 ENCOUNTER — Other Ambulatory Visit: Payer: Self-pay | Admitting: Family Medicine

## 2023-06-30 ENCOUNTER — Ambulatory Visit
Admission: RE | Admit: 2023-06-30 | Discharge: 2023-06-30 | Disposition: A | Discharge status: Home / Self Care | Source: Ambulatory Visit | Attending: Family Medicine | Admitting: Family Medicine

## 2023-06-30 ENCOUNTER — Ambulatory Visit
Admission: RE | Admit: 2023-06-30 | Discharge: 2023-06-30 | Disposition: A | Discharge status: Home / Self Care | Source: Ambulatory Visit | Attending: Body Imaging | Admitting: Body Imaging

## 2023-06-30 ENCOUNTER — Ambulatory Visit: Payer: Self-pay | Admitting: Family Medicine

## 2023-06-30 DIAGNOSIS — N644 Mastodynia: Secondary | ICD-10-CM | POA: Insufficient documentation

## 2023-06-30 DIAGNOSIS — N6315 Unspecified lump in the right breast, overlapping quadrants: Secondary | ICD-10-CM | POA: Insufficient documentation

## 2023-08-03 ENCOUNTER — Other Ambulatory Visit: Payer: Self-pay | Admitting: Family Medicine

## 2023-08-03 DIAGNOSIS — I10 Essential (primary) hypertension: Secondary | ICD-10-CM

## 2023-08-04 NOTE — Telephone Encounter (Signed)
 Pharmacy Refill Optimization (PRO)    Refill authorized per PRO CPA 690-00 08/04/2023    Meets PRO CPA 690-00: YES    See Protocol Details for additional information   ====================================================================    Medication name:   Requested Prescriptions     Pending Prescriptions Disp Refills    Metoprolol  Succinate (TOPROL  XL) 50 mg SR Tablet [Pharmacy Med Name: Metoprolol  Succinate ER 50 MG Oral Tablet Extended Release 24 Hour] 90 tablet 3     Sig: TAKE 1 TABLET BY MOUTH DAILY    Hydrochlorothiazide  (HYDRO-DIURIL) 12.5 mg Tablet [Pharmacy Med Name: hydroCHLOROthiazide  12.5 MG Oral Tablet] 90 tablet 3     Sig: Take 1 tablet by mouth every morning.     Labs (if required by protocol):   Lab Results   Component Value Date    NA 138 03/12/2023    NA 139 08/26/2022    K 4.3 03/12/2023    K 4.2 08/26/2022    CA 9.8 03/12/2023    CA 10.0 08/26/2022    CO2 30 (H) 03/12/2023    CO2 30 (H) 08/26/2022    CR 0.72 03/12/2023    CR 0.67 08/26/2022    GFR 92 03/12/2023    GFR 96 08/26/2022    BUN 30 (H) 03/12/2023    BUN 19 08/26/2022   Marked taking 05/22/22

## 2023-08-19 ENCOUNTER — Ambulatory Visit: Attending: Family Medicine

## 2023-08-19 DIAGNOSIS — K760 Fatty (change of) liver, not elsewhere classified: Secondary | ICD-10-CM | POA: Insufficient documentation

## 2023-08-19 DIAGNOSIS — E7800 Pure hypercholesterolemia, unspecified: Secondary | ICD-10-CM | POA: Insufficient documentation

## 2023-08-19 LAB — HEPATIC FUNCTION PANEL
Alanine Transferase (ALT): 29 U/L (ref <=33)
Albumin: 4.8 g/dL (ref 4.0–4.9)
Alkaline Phosphatase (ALP): 81 U/L (ref 35–129)
Aspartate Transaminase (AST): 33 U/L (ref <=41)
Bilirubin Direct: 0.2 mg/dL (ref 0.0–0.2)
Bilirubin Total: 0.9 mg/dL (ref <=1.2)
Protein: 8 g/dL (ref 6.6–8.7)

## 2023-08-25 ENCOUNTER — Other Ambulatory Visit: Payer: Self-pay | Admitting: Family Medicine

## 2023-08-25 DIAGNOSIS — K279 Peptic ulcer, site unspecified, unspecified as acute or chronic, without hemorrhage or perforation: Secondary | ICD-10-CM

## 2023-08-26 NOTE — Telephone Encounter (Signed)
 Pharmacy Refill Optimization (PRO)    Refill authorized per PRO CPA 690-00 08/26/2023    Meets PRO CPA 690-00: YES    See Protocol Details for additional information   ====================================================================    Medication name:   Requested Prescriptions     Pending Prescriptions Disp Refills    FamoTIDine  (PEPCID ) 40 mg Tablet [Pharmacy Med Name: Famotidine  40 MG Oral Tablet] 90 tablet 3     Sig: TAKE 1 TABLET BY MOUTH DAILY AT  BEDTIME   05/22/23 marked taking  Labs (if required by protocol):   Lab Results   Component Value Date    NA 138 03/12/2023    NA 139 08/26/2022    K 4.3 03/12/2023    K 4.2 08/26/2022    CA 9.8 03/12/2023    CA 10.0 08/26/2022    CO2 30 (H) 03/12/2023    CO2 30 (H) 08/26/2022    CR 0.72 03/12/2023    CR 0.67 08/26/2022    GFR 92 03/12/2023    GFR 96 08/26/2022    BUN 30 (H) 03/12/2023    BUN 19 08/26/2022

## 2023-09-23 ENCOUNTER — Other Ambulatory Visit: Payer: Self-pay

## 2023-12-30 ENCOUNTER — Other Ambulatory Visit: Payer: Self-pay | Admitting: Family Medicine

## 2023-12-30 DIAGNOSIS — Z1231 Encounter for screening mammogram for malignant neoplasm of breast: Secondary | ICD-10-CM

## 2024-01-13 ENCOUNTER — Ambulatory Visit
Admission: RE | Admit: 2024-01-13 | Discharge: 2024-01-13 | Disposition: A | Discharge status: Home / Self Care | Source: Ambulatory Visit

## 2024-01-13 DIAGNOSIS — Z1231 Encounter for screening mammogram for malignant neoplasm of breast: Secondary | ICD-10-CM

## 2024-01-17 ENCOUNTER — Other Ambulatory Visit: Payer: Self-pay | Admitting: Family Medicine

## 2024-01-17 DIAGNOSIS — N952 Postmenopausal atrophic vaginitis: Secondary | ICD-10-CM

## 2024-01-18 NOTE — Telephone Encounter (Signed)
 Pharmacy Refill Optimization (PRO)    Refill authorized per PRO CPA 690-00 01/18/2024    Meets PRO CPA 690-00: YES    ====================================================================    Provider: Francella Landon Record, MD  Recent Visits  Date Type Provider Dept   05/22/23 Office Visit Francella Landon Record, MD [facility]   03/12/23 Office Visit Francella Landon Record, MD [facility]   Showing recent visits within past 365 days and meeting all other requirements  Future Appointments  No visits were found meeting these conditions.  Showing future appointments within next 365 days and meeting all other requirements    Medication Name:   Requested Prescriptions     Pending Prescriptions Disp Refills    estradiol  10 mcg tablet [Pharmacy Med Name: Estradiol  10 MCG Vaginal Tablet] 40 tablet 3     Sig: PLACE 1 INSERT VAGINALLY 3 TIMES WEEKLY ON MONDAY, WEDNESDAY, AND FRIDAY     Last prescribed date per EMR: 04/16/23  Med last filled: 11/11/23  Med last reviewed/reconciled: 05/22/23  Labs (if required by protocol): N/A

## 2024-01-20 ENCOUNTER — Other Ambulatory Visit: Payer: Self-pay | Admitting: Family Medicine

## 2024-01-20 DIAGNOSIS — K279 Peptic ulcer, site unspecified, unspecified as acute or chronic, without hemorrhage or perforation: Secondary | ICD-10-CM

## 2024-01-22 NOTE — Telephone Encounter (Signed)
 Pharmacy Refill Optimization (PRO)    Refill authorized per PRO CPA 690-00 01/22/2024    Meets PRO CPA 690-00: YES    See Protocol Details for additional information   ====================================================================    Medication name:   Requested Prescriptions     Pending Prescriptions Disp Refills    famoTIDine  (Pepcid ) 40 mg tablet [Pharmacy Med Name: Famotidine  40 MG Oral Tablet] 90 tablet 3     Sig: TAKE 1 TABLET BY MOUTH DAILY AT  BEDTIME     Labs (if required by protocol):   Lab Results   Component Value Date    NA 138 03/12/2023    NA 139 08/26/2022    K 4.3 03/12/2023    K 4.2 08/26/2022    CA 9.8 03/12/2023    CA 10.0 08/26/2022    CO2 30 (H) 03/12/2023    CO2 30 (H) 08/26/2022    CR 0.72 03/12/2023    CR 0.67 08/26/2022    GFR 92 03/12/2023    GFR 96 08/26/2022    BUN 30 (H) 03/12/2023    BUN 19 08/26/2022   05/22/23 med marked as reviewed

## 2024-01-24 ENCOUNTER — Ambulatory Visit: Payer: Self-pay | Admitting: Family Medicine

## 2024-02-19 ENCOUNTER — Ambulatory Visit: Attending: Family Medicine

## 2024-02-19 DIAGNOSIS — K760 Fatty (change of) liver, not elsewhere classified: Secondary | ICD-10-CM

## 2024-02-19 DIAGNOSIS — E7800 Pure hypercholesterolemia, unspecified: Secondary | ICD-10-CM | POA: Insufficient documentation

## 2024-02-19 LAB — HEPATIC FUNCTION PANEL
Alanine Transferase (ALT): 40 U/L — ABNORMAL HIGH (ref <=33)
Albumin: 4.7 g/dL (ref 4.0–4.9)
Alkaline Phosphatase (ALP): 62 U/L (ref 35–129)
Aspartate Transaminase (AST): 39 U/L (ref <=41)
Bilirubin Direct: 0.2 mg/dL (ref 0.0–0.2)
Bilirubin Total: 0.6 mg/dL (ref <=1.2)
Protein: 7.8 g/dL (ref 6.6–8.7)

## 2024-02-19 LAB — LIPID PANEL
Cholesterol: 251 mg/dL — ABNORMAL HIGH (ref <200)
HDL Cholesterol: 62 mg/dL (ref >40)
LDL Cholesterol Calculation: 147 mg/dL — ABNORMAL HIGH (ref <100)
Non-HDL Cholesterol: 189 mg/dL — ABNORMAL HIGH (ref <=150)
Total Cholesterol: HDL Ratio: 4 — ABNORMAL HIGH (ref <4.0)
Triglyceride Level: 210 mg/dL — ABNORMAL HIGH (ref <150)

## 2024-02-19 NOTE — Progress Notes (Unsigned)
 Medicare Wellness Visit for Marisa Jenkins       Assessment Plan for Treatment, Screening/Prevention  Assessment & Plan  General Health Maintenance  Due for tetanus vaccine. Declined flu and COVID vaccines. Discussed importance of regular screenings and vaccinations.  - Obtain tetanus vaccine at pharmacy  - Schedule colonoscopy as due  - Consider breast scan as recommendedChronic thoracic pain  Persisting for a year, exacerbated by physical activity such as yard work. Previous x-rays were unremarkable. No physical therapy has been attempted yet.  - Initiated physical therapy for thoracic pain management  - Will consider CT scan if physical therapy is ineffective    Hyperlipidemia  Cholesterol levels remain elevated despite current rosuvastatin  5 mg daily. LDL levels are not well controlled, while HDL is satisfactory. No adverse effects reported from current statin therapy.  - Increased rosuvastatin  to 20 mg daily, taken as two 10 mg tablets in the morning and two in the evening    Right foot bunion  Painful right bunion causing discomfort with certain footwear. No surgical intervention planned at this time.  - Referred to podiatrist for evaluation and management of bunion  - Consider orthotics and shoe modifications to alleviate symptoms    Stress urinary incontinence  Worsening symptoms of stress urinary incontinence, including urgency and leakage with coughing or sneezing. Previous physical therapy was ineffective due to hereditary factors. Interested in non-surgical options such as acupuncture.  - Will consider referral to urologist for evaluation and potential acupuncture therapy    Osteoporosis  Currently on alendronate  for osteoporosis. Recent dental procedure necessitates temporary discontinuation of alendronate  due to potential complications with dental implants.  - Discontinued alendronate  until after dental implant procedure  - Encouraged weight-bearing exercises and walking    Generalized anxiety  disorder  Requests refill of Valium  for anxiety management. No recent exacerbations reported.  - Refilled Valium  prescription    Hypertension  Blood pressure is generally well-controlled with current medication regimen. Elevated readings noted.  - Continue current antihypertensive medications (metoprolol  and hydrochlorothiazide )    Nonalcoholic fatty liver disease  Liver function tests show slight elevation.  - Continue monitoring liver function tests       Modified Medications    Modified Medication Previous Medication    DIAZEPAM  (VALIUM ) 5 MG TABLET Diazepam  (VALIUM ) 5 mg Tablet       Take 1 tablet by mouth once daily if needed for anxiety or insomnia.    Take 1 tablet by mouth once daily if needed for anxiety or insomnia.      Encounter for subsequent annual wellness visit (AWV) in Medicare patient  (primary encounter diagnosis)    Hyperlipidemia with target LDL less than 100  Plan: rosuvastatin  (Crestor ) 20 mg tablet, Lipid         Panel, Hepatic Function Panel    Bunion of great toe of right foot  Plan: Podiatry Referral    Screen for colon cancer  Plan: GASTROENTEROLOGY REFERRAL    Generalized anxiety disorder  Plan: diazePAM  (Valium ) 5 mg tablet     Today's visit has complexity inherent to evaluation and management associated with medical care services that serve as the continuing focal point for all needed health care services.    Annual follow up   Plan of Care Discussed With:   the patient  Prevention counseling:     For routine female prevention includes  annual physical exam, monthly self breast self examination, annual mammograms, possibly up until age 31. Discuss any pelvic symptoms including  pelvic pain, persistent bloating, or enlarging lower abdomen, abnormal vaginal discharge or postmenopausal bleeding.  A Gynecologic Cancers information is available to the patient.      For both men and women: Advise screening for hyperlipidemia, healthy diet and exercise, routine immunizations, sun protection  and routine examinations. Timing for colon cancer screening for age 38 and above and there are at least 3 types of available screening measures. Self care measures include skin care, dental care, seatbelt and helmet use.   Review of general self care and safety measures:    Avoid heavy Alcohol use (1 drink or less per day for women and 2 drinks or less per day for men)/ avoid all tobacco use, please ask your health care team any questions in regards to contraception, healthy diet, exercise at least 20 minutes straight per day if able,  annual physical exam with blood pressure and pulse check, cardiovascular, pulmonary and abdominal exam, exam of any skin lesions, and review of general health, immunizations, if at risk for sexually transmitted diseases: STD prevention (abstinence, or female or female condom use every sexual encounter, self breast exam once a month, skin sun protection with protective clothing, hat or sunscreen at least every 2 hours while outdoors, general preventive self care, important to use seatbelt/ helmet use, dental care floss daily, brush at least twice a day, rinse mouth with water after each meal, and schedule a dental cleaning at least twice a year. Your mental/ emotional health is important also. Take some time everyday to plan activities you want to do.  Avoid frequent interactions with people who do not support you in your self care if at all possible.      Also see patient instructions for any additional recommendations.  Advise to return to clinic in one year for follow up exam, sooner if needed for new or ongoing health concerns(s).     Subjective     Marisa Jenkins is a 68yr old female is here for her Medicare wellness visit.   I obtained verbal consent from the patient to use AI ambient technology to transcribe the interactions between the patient and myself during the clinical encounter.       History of Present Illness  Marisa Jenkins is a 68 year old female who presents with  persistent rib pain and elevated cholesterol.    Rib pain  - Persistent rib pain since accidental rib fracture last year  - Pain worsened by physical activity such as yard work and pulling weeds  - Discomfort persists for the rest of the day after activity  - No physical therapy received for this issue    Hypercholesterolemia  - Cholesterol level elevated at 251 mg/dL despite regular use of rosuvastatin  5 mg daily  - Adheres to medication regimen  - No muscle pain  - Intermittent leg and back spasms    Dental issues  - Front tooth extraction due to infection from internal resorption attributed to prior trauma  - Temporary replacement in place  - Awaiting dental implant in two months    Urinary incontinence  - History of bladder lift procedure many years ago, now less effective  - Experiences urgency and occasional leakage if unable to reach bathroom in time  - Family history of similar urinary issues in mother and aunt    Right foot bunion  - Painful and increasingly prominent bunion on right foot  - Discomfort with certain shoes  - No treatment pursued yet  Colorectal cancer screening  - History of precancerous polyps  - Last colonoscopy performed December 2020  - Advised to schedule repeat colonoscopy    Hypertension  - Blood pressure generally well-controlled with metoprolol  and hydrochlorothiazide   - Regular medication adherence  - Home blood pressure readings usually in the lower 120s    Hepatic abnormalities  - History of elevated liver enzymes and fatty liver  - Abnormalities noted on blood tests in December 2025    General health and mobility  - No recent falls  - Considering joining a gym for water aerobics  Also  complains of  bunion pain, anxiety, requests valium  refill.  Medications refill      Lasft pap smear test 2021 normal   Last mammogram 01/2024    Last labs 02/19/24 with hyperlipidemia and elevated liver function tests  .   last bone density test/ dexa scan 2024.    colon cancer screening due  now, last one 2020 and due every 3 years     Functional Status:     Fall risks Assessment:   No falls in past year. No throw rugs, poorly lighted areas, missing rails/grab bars in the home. No medications or conditions that affect stability or mobility.    Advance Care Planning:   The patient /  surrogate was counseled about the importance of Advance Care Planning.  The patient is amenable to discussion of end-of-life issues.  I am willing to follow the patient's wishes as stated in POLST/Advanced Directive.   In addition:   health care agent is spouse Dempsey Jenkins     The patient/surrogate and I reviewed ACP Documents. The documents in EMR are up to date.  and Surrogate Decision Maker was confirmed and updated in ACP Activity.    POLST: given form and information in regards to filling out/ keeping up to date      Vaccines:     Immunization History   Administered Date(s) Administered    COVID-19, mRNA, LNP-S, 30 mcg/0.3 mL (PFIZER) (PURPLE CAP) (12 YRS & OLDER) 07/12/2019, 08/02/2019    Influenza Vaccine, Quadrivalent (Flulaval ) 12/04/2015, 12/30/2016    Influenza Vaccine, Quadrivalent (Fluzone /Fluarix ) 01/04/2013, 12/29/2013, 12/12/2014    Influenza, High-Dose, Quadrivalent (FLUZONE  HIGH-DOSE QUADRIVALENT) 12/19/2020, 12/19/2021    Influenza, Quadrivalent, (FLULAVAL , FLUZONE , FLUARIX ) 12/15/2017, 12/23/2018    Influenza, Seasonal (AFLURIA, FLULAVAL , FLUVIRIN, FLUZONE ) 12/29/2019    Pneumococcal Conjugate PCV20, (PREVNAR 20 ) 01/02/2021    Td (Adult) Preservative Free 12/04/2005    Tdap 11/24/2008    Tdap (ADACEL, BOOSTRIX) 03/11/2013, 12/28/2013    Zoster Live (ZOSTAVAX) 07/19/2015    Zoster Recombinant (SHINGRIX ) 06/26/2016, 01/15/2017      Due for Tdap, Covid-19, pneumonia     Medications reviewed:    Last eye exam    Up to date     Health History  I reviewed and appropriately updated her lists of current problems, past medical/surgical history, and social history.     Lifestyle:   Diet: typical American,   and  engages in minimal exercise.    She reports that she has been a  former smoker    She has never  used smokeless tobacco.   She  reports current alcohol use of about 2 standard drinks of alcohol per week.   She  reports that she does not  use drugs.  Reviewed & updated as appropriate Arilla's Tobacco Use History[1],    Mood disorder screening:   PHQ-2 results       05/22/2023  11:06 AM   PHQ-2   Interest 0   Feeling 0   PHQ-2 Total Score 0         No Value Found   Cognitive screen: cognition and memory exam grossly normal   REVIEW of SYSTEMS   As per history of the present illness   Objective     Exam:  BP 130/86 (SITE: right arm, Orthostatic Position: sitting, Cuff Size: regular)   Pulse 80   Temp 36.6 C (97.8 F)   Ht 1.651 m (5' 5)   Wt 63.6 kg (140 lb 3.4 oz)   LMP 10/28/2010   BMI 23.33 kg/m    Body mass index is 23.33 kg/m.    Physical Exam  GENERAL: Alert, cooperative, well developed, no acute distress.  HEENT: Normocephalic, normal oropharynx, moist mucous membranes, cerumen present in ear, not fully occluding.  CHEST: Clear to auscultation bilaterally, no wheezes, rhonchi, or crackles.  CARDIOVASCULAR: Normal heart rate and rhythm, S1 and S2 normal without murmurs.  BREAST: Breasts normal.  ABDOMEN: Soft, non-tender, non-distended, without organomegaly, normal bowel sounds.  EXTREMITIES: No cyanosis or edema. Bilateral bunions, right worse than left   NEUROLOGICAL: Cranial nerves grossly intact, moves all extremities without gross motor or sensory deficit.  General Appearance: healthy, 6yr female with Body mass index is 23.33 kg/m. alert, no distress, pleasant affect, cooperative.    Eyes:.Lids and lashes normal. Pupils equally round and reactive to light, extraocular movements full. No scleral injection or conjunctival inflammation. Fundi are benign. and external inspection of ears show no abnormality  Dental hygiene good    Neck:   supple, no adenopathy, thyroid normal  Heart:  normal rate and  regular rhythm, no murmurs, clicks, or gallops.   Lungs: clear to auscultation bilaterally    Breast:  inspection negative. No nipple discharge, bleeding or masses, no axillary lymphadenopathy   Abdomen: BS normal.  Abdomen soft, non-tender.  No masses or organomegaly.    Extremities:  no cyanosis, clubbing, or edema. Diabetic exam: .   Skin:  Skin color, texture, turgor normal. No rashes or suspicious skin lesions    Pelvic:  not indicated   Mental Status: euthymic, appropriate questions asked   Musculoskeletal:symmetric muscular strength, full range of motion of all extremities      Neuro: face symmetric  vision and hearing grossly intact  Gait normal. Reflexes normal and symmetric. Sensation and strength grossly normal.      Labs reviewed:         03/12/23  1526   NA 138   K 4.3   CL 98   CO2 30*   BUN 30*   CR 0.72   GLU 94            02/19/24  0900   CHOL 251*   LDLC 147*   HDL 62   TRIG 210*            05/30/20  1038 08/26/22  1041 03/12/23  1526   HGBA1C 5.8* 6.1* 6.1*            05/11/18  0745   WBC 4.9   HGB 13.7   HCT 41.1   PLT 223      No results for input(s): TSH in the last 12399 hours.  ,  Results  Labs  Total cholesterol (02/17/2024): 251  HDL (02/17/2024): Within normal limits  LDL (02/17/2024): Elevated  Liver function panel (02/17/2024): Slightly elevated    Diagnostic  Bone density scan (2024): Osteopenia  Colonoscopy (02/2019): Two hyperplastic polyps  Education Plan:  See information in the Patient Instructions section  Barriers to Learning assessed: none   Patient verbalizes understanding of teaching and instructions.    This document has been created using [employer] and has been checked for content. Some voice recognition errors may have been missed inadvertantly.Please note any grammatical, sound-alike, or syntax errors as likely dictation errors.     Marisa Jenkins If you are reviewing this progress note and have questions about the meaning or medical terms being used, please schedule  an appointment or bring it up at your next follow-up appointment.  Medical notes are meant to be a communication tool between medical professionals and require medical terms to be used for efficiency. You can also look up terms via on-line medical dictionaries such as Medline Plus at   muscletreatments.it.html   Electronically signed by:  Landon Burnet, MD  Family Practice, [facility]  [facility]        [1]   Social History  Tobacco Use   Smoking Status Former   Smokeless Tobacco Never   Tobacco Comments    quit at age 81

## 2024-02-24 ENCOUNTER — Ambulatory Visit: Admitting: Family Medicine

## 2024-02-24 ENCOUNTER — Other Ambulatory Visit: Payer: Self-pay

## 2024-02-24 ENCOUNTER — Ambulatory Visit: Payer: Self-pay | Admitting: Family Medicine

## 2024-02-24 VITALS — BP 130/86 | HR 80 | Temp 97.8°F | Ht 65.0 in | Wt 140.2 lb

## 2024-02-24 DIAGNOSIS — F411 Generalized anxiety disorder: Secondary | ICD-10-CM

## 2024-02-24 DIAGNOSIS — Z1211 Encounter for screening for malignant neoplasm of colon: Secondary | ICD-10-CM

## 2024-02-24 DIAGNOSIS — M21611 Bunion of right foot: Secondary | ICD-10-CM

## 2024-02-24 DIAGNOSIS — Z0000 Encounter for general adult medical examination without abnormal findings: Secondary | ICD-10-CM

## 2024-02-24 DIAGNOSIS — E785 Hyperlipidemia, unspecified: Secondary | ICD-10-CM

## 2024-02-24 MED ORDER — DIAZEPAM 5 MG TABLET
5.0000 mg | ORAL_TABLET | Freq: Every day | ORAL | 0 refills | Status: AC | PRN
  Filled 2024-02-24: qty 30, 30d supply, fill #0

## 2024-02-24 MED ORDER — ROSUVASTATIN 20 MG TABLET
20.0000 mg | ORAL_TABLET | Freq: Every day | ORAL | 1 refills | Status: AC
  Filled 2024-02-24: qty 90, 90d supply, fill #0

## 2024-02-24 NOTE — Nursing Note (Signed)
 Vital signs taken, allergies verified, screened for pain and chief complaint noted. Joylene Draft MAII

## 2024-02-25 ENCOUNTER — Encounter: Payer: Self-pay | Admitting: Family Medicine

## 2024-03-17 ENCOUNTER — Other Ambulatory Visit: Payer: Self-pay | Admitting: Family Medicine

## 2024-03-17 DIAGNOSIS — M8588 Other specified disorders of bone density and structure, other site: Secondary | ICD-10-CM

## 2024-03-20 NOTE — Telephone Encounter (Signed)
 Pharmacy Refill Optimization (PRO)    Transition Fill - Refill authorized per PRO CPA 690-00 03/20/2024  Lab(s) overdue, but within range. Transition fill and overdue lab ordered per protocol. Lab compliance letter has been issued.   Future refills will require provider authorization if patient fails to draw overdue lab.    See Protocol Details for additional information   ====================================================================    Medication name:   Requested Prescriptions     Pending Prescriptions Disp Refills    alendronate  (Fosamax ) 70 mg tablet [Pharmacy Med Name: Alendronate  Sodium 70 MG Oral Tablet] 12 tablet 3     Sig: TAKE 1 TABLET BY MOUTH 1 TIME  EACH WEEK TAKE WITH WATER ON  EMPTY STOMACH, 1/2 HOUR BEFORE  FOOD OR LYING DOWN     Labs (if required by protocol):   Lab Results   Component Value Date    NA 138 03/12/2023    NA 139 08/26/2022    K 4.3 03/12/2023    K 4.2 08/26/2022    CA 9.8 03/12/2023    CA 10.0 08/26/2022    CO2 30 (H) 03/12/2023    CO2 30 (H) 08/26/2022    CR 0.72 03/12/2023    CR 0.67 08/26/2022    GFR 92 03/12/2023    GFR 96 08/26/2022    BUN 30 (H) 03/12/2023    BUN 19 08/26/2022     Estimated CrCl (per weight 63.6 kg) = 74.287 mL/min

## 2024-03-21 ENCOUNTER — Other Ambulatory Visit: Payer: Self-pay

## 2024-03-21 ENCOUNTER — Other Ambulatory Visit: Payer: Self-pay | Admitting: Gastroenterology

## 2024-03-21 MED ORDER — PEG 3350-ELECTROLYTES 236 GRAM-22.74 GRAM-6.74 GRAM-5.86 GRAM SOLUTION
4000.0000 mL | Freq: Once | ORAL | 0 refills | Status: AC
  Filled 2024-03-21: qty 4000, 1d supply, fill #0

## 2024-03-27 NOTE — Progress Notes (Incomplete)
 GASTROENTEROLOGY PRE-PROCEDURE HISTORY & PHYSICAL    Date of Service: 04/27/2024    Referring Provider:  PCP: Francella Landon Record, MD    Procedure:  Colonoscopy    Attending: Gail Maroon, MD    HPI: Marisa Jenkins is a 69yr old pleasant female with a history of multiple medical problems presents for an open access colonoscopy for surveillance. It is noted that a pedunculated rectal polyp revealed intramucosal adeno ca in 2016. The last colonoscopy was in 2020.    The patient's past medical, family, and social history was reviewed.    IMMEDIATE PRE-SEDATION ASSESSMENT  Pre-Sedation/Anesthesia Assessment:    2 - Mild, controlled systemic disease and no functional limitations    Airway Assessment:    Mallampati class 1  ROM normal    Physical Exam:   General Appearance: *** Healthy, alert, not in distress, pleasant affect, cooperative.   Eyes: sclera anicteric.   Heart:  Heart Exam:10535::normal rate and regular rhythm, no *** murmurs, clicks, or gallops.   Lungs:  Lung Exam Brief:10013::clear to auscultation.   Abdomen:  Abdominal Brief Exam:10447::BS normal.  Abdomen soft, non-tender.  No masses or organomegaly.   Extremities:  Extremity Exam:10533::no cyanosis, clubbing, or edema.     Impression/Plan:  Colonoscopy    Patient is a candidate for moderate sedation.    The procedure, risks, benefits, and alternatives were explained. The patient was clearly informed of the moderate sedation and not deep sedation/anesthesia. The limitations of moderate sedation were also discussed. It is noted that the pt has been on Valium  5 mg q daily prn. She may have tolerance to the drugs we use and she was informed that also. The patient requested for us  to proceed with the procedure= under moderate sedation only.   All the patient's questions were answered.  An informed consent was obtained.     Patient barriers to learning:  none    Patient/family understanding:  verbalizes    Traycen Goyer, M.D.

## 2024-04-05 ENCOUNTER — Ambulatory Visit: Payer: Self-pay | Admitting: Family Medicine

## 2024-04-05 NOTE — Telephone Encounter (Signed)
 Emergency Advice / BATLINE:    Reason for Call:  Patient experiencing symptoms of L side ribs fractured. Per pt she states she fractured her L side ribs last Saturday (03/26/24) and is the same side she fractures last year.       Harlene Patch   [facility]   Patient Contact Center

## 2024-04-05 NOTE — Telephone Encounter (Signed)
 3 patient identifiers used.  Per:   patient.    Disposition: GO TO ED NOW   Referred to ER per protocol   Per:   patient verbalizes agreement to plan. Agrees to callback with any increase in symptoms/concerns or questions.    See Assessment Below  Tripped on rug in bathroom and landed on edge of  tub on left rib this past 03/26/24.  Same side that had fracture in the past  Was seen by [facility] care NP home visit today.  Was told may have fx floater rib.  Patient having pain and can not sleep at night. States pain is severe.  No diff breathing.  Referred to ER, patient agrees and will go to ER now, spouse will drive patient.  To call with worsening symptoms.    Hargis Cava, RN  PCN Triage      See Care Advice Below  Care Advice            Chest Carolanne FORBES Cava, RN Tue Apr 05, 2024 02:21 PM      Care Advice       GO TO ED NOW:  * You need to be seen in the Emergency Department.  * Go to the ED at the Hospital.  * Leave now. Drive carefully.    NOTE TO TRIAGER - DRIVING:  * Another adult should drive.  * Patient should not delay going to the emergency department.  * If immediate transportation is not available via car, rideshare (e.g., Lyft, Uber), or taxi, then the patient should be instructed to call [id number].                            See Protocol and Disposition Below    Reason for Disposition   SEVERE chest pain    Additional Information   Negative: Major injury from dangerous force or speed (e.g., MVA, fall > 10 feet or 3 meters)   Negative: Bullet wound, knife wound, or other penetrating object   Negative: Puncture wound that sounds life-threatening to the triager   Negative: SEVERE difficulty breathing (e.g., struggling for each breath, speaks in single words)   Negative: [1] Major bleeding (e.g., actively dripping or spurting) AND [2] can't be stopped   Negative: Open wound of the chest with sound of moving air (sucking wound) or visible air bubbles   Negative: Shock suspected (e.g.,  cold/pale/clammy skin, too weak to stand, low BP, rapid pulse)   Negative: Coughing or spitting up blood   Negative: Bluish (or gray) lips or face now   Negative: Unconscious or was unconscious   Negative: Sounds like a life-threatening emergency to the triager    Protocols used: Chest Injury-Adult-AH

## 2024-04-05 NOTE — Telephone Encounter (Signed)
 Thank you, I do not see that she went to the emergency room.  Please call back to ask which emergency room did she go to?  We are going to need to obtain her records for follow-up.

## 2024-04-06 NOTE — Telephone Encounter (Signed)
 Thank you she has 3 rib fractures. Also a potential lesion in the liver versus fatty area in the liver as well as a small lung nodule.  She will need a follow-up appointment but can wait until rib fractures are less painful.  Also had hypokalemia so I'm glad she scheduled a follow-up appointment. Landon Burnet, MD
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
## 2024-04-06 NOTE — Telephone Encounter (Signed)
 [name] is a 69yr old female  3 patient identifiers used.  Attempted to contact patient with no answer. Left voicemail message on identified voicemail to return call to the clinic for message from MD.     Hargis Cava, RN  PCN Triage

## 2024-04-06 NOTE — Telephone Encounter (Signed)
 Patient called in, relayed message.     Patient states she was seen at  [facility] ER on 04/05/24     Marisa Jenkins  Patient Service Rep. II  Patient Contact Center

## 2024-04-11 ENCOUNTER — Other Ambulatory Visit: Payer: Self-pay

## 2024-04-11 ENCOUNTER — Ambulatory Visit: Admitting: Family Medicine

## 2024-04-11 VITALS — BP 127/68 | HR 65 | Temp 97.6°F | Wt 139.1 lb

## 2024-04-11 DIAGNOSIS — E876 Hypokalemia: Secondary | ICD-10-CM

## 2024-04-11 DIAGNOSIS — S2242XD Multiple fractures of ribs, left side, subsequent encounter for fracture with routine healing: Secondary | ICD-10-CM

## 2024-04-11 DIAGNOSIS — Z23 Encounter for immunization: Secondary | ICD-10-CM

## 2024-04-11 DIAGNOSIS — K769 Liver disease, unspecified: Secondary | ICD-10-CM

## 2024-04-11 MED ORDER — HYDROCODONE 5 MG-ACETAMINOPHEN 325 MG TABLET
1.0000 | ORAL_TABLET | Freq: Four times a day (QID) | ORAL | 0 refills | Status: AC | PRN
  Filled 2024-04-11: qty 40, 10d supply, fill #0

## 2024-04-11 MED ORDER — ALPRAZOLAM 0.5 MG TABLET
0.5000 mg | ORAL_TABLET | Freq: Once | ORAL | 0 refills | Status: AC
  Filled 2024-04-11: qty 2, 2d supply, fill #0

## 2024-04-11 MED ORDER — NALOXONE 4 MG/ACTUATION NASAL SPRAY
NASAL | 0 refills | Status: AC
  Filled 2024-04-11: qty 2, 15d supply, fill #0

## 2024-04-11 MED ORDER — DIPHTH,PERTUSSIS(ACEL),TETANUS 2.5 LF UNIT-8 MCG-5 LF/0.5ML IM SYRINGE
0.5000 mL | INJECTION | Freq: Once | INTRAMUSCULAR | 0 refills | Status: AC
  Filled 2024-04-11: qty 0.5, fill #0

## 2024-04-11 NOTE — Nursing Note (Signed)
 Vital signs taken, allergies verified, screened for pain and chief complaint noted. Joylene Draft MAII

## 2024-04-27 ENCOUNTER — Ambulatory Visit: Admitting: Internal Medicine
